# Patient Record
Sex: Female | Born: 1990 | Race: White | Hispanic: No | Marital: Single | Smoking: Former smoker
Health system: Southern US, Community
[De-identification: ages and names within clinical notes are randomized; demographics above are authoritative.]

## PROBLEM LIST (undated history)

## (undated) ENCOUNTER — Emergency Department: Disposition: A | Discharge status: Still In Hospital | Payer: Self-pay

## (undated) DIAGNOSIS — F32A Depression, unspecified: Secondary | ICD-10-CM

## (undated) DIAGNOSIS — Z21 Asymptomatic human immunodeficiency virus [HIV] infection status: Secondary | ICD-10-CM

## (undated) DIAGNOSIS — B20 Human immunodeficiency virus [HIV] disease: Secondary | ICD-10-CM

## (undated) DIAGNOSIS — C801 Malignant (primary) neoplasm, unspecified: Secondary | ICD-10-CM

## (undated) HISTORY — PX: SKIN SURGERY: SHX2413

---

## 2020-11-07 ENCOUNTER — Emergency Department
Admission: EM | Admit: 2020-11-07 | Discharge: 2020-11-07 | Disposition: A | Discharge status: Home / Self Care | Payer: Medicaid Other | Attending: Student in an Organized Health Care Education/Training Program | Admitting: Student in an Organized Health Care Education/Training Program

## 2020-11-07 ENCOUNTER — Other Ambulatory Visit: Payer: Self-pay

## 2020-11-07 DIAGNOSIS — F70 Mild intellectual disabilities: Secondary | ICD-10-CM | POA: Diagnosis not present

## 2020-11-07 DIAGNOSIS — Z21 Asymptomatic human immunodeficiency virus [HIV] infection status: Secondary | ICD-10-CM | POA: Diagnosis not present

## 2020-11-07 DIAGNOSIS — F17210 Nicotine dependence, cigarettes, uncomplicated: Secondary | ICD-10-CM | POA: Insufficient documentation

## 2020-11-07 DIAGNOSIS — Z20822 Contact with and (suspected) exposure to covid-19: Secondary | ICD-10-CM | POA: Insufficient documentation

## 2020-11-07 DIAGNOSIS — F4325 Adjustment disorder with mixed disturbance of emotions and conduct: Secondary | ICD-10-CM

## 2020-11-07 DIAGNOSIS — R45851 Suicidal ideations: Secondary | ICD-10-CM

## 2020-11-07 DIAGNOSIS — F909 Attention-deficit hyperactivity disorder, unspecified type: Secondary | ICD-10-CM | POA: Insufficient documentation

## 2020-11-07 HISTORY — DX: Human immunodeficiency virus (HIV) disease: B20

## 2020-11-07 HISTORY — DX: Asymptomatic human immunodeficiency virus (hiv) infection status: Z21

## 2020-11-07 LAB — COMPREHENSIVE METABOLIC PANEL
ALT: 13 U/L (ref 0–44)
AST: 14 U/L — ABNORMAL LOW (ref 15–41)
Albumin: 4.4 g/dL (ref 3.5–5.0)
Alkaline Phosphatase: 47 U/L (ref 38–126)
Anion gap: 10 (ref 5–15)
BUN: 20 mg/dL (ref 6–20)
CO2: 20 mmol/L — ABNORMAL LOW (ref 22–32)
Calcium: 9.3 mg/dL (ref 8.9–10.3)
Chloride: 107 mmol/L (ref 98–111)
Creatinine, Ser: 1.24 mg/dL — ABNORMAL HIGH (ref 0.44–1.00)
GFR, Estimated: >60 mL/min (ref >60)
Glucose, Bld: 95 mg/dL (ref 70–99)
Potassium: 4.1 mmol/L (ref 3.5–5.1)
Sodium: 137 mmol/L (ref 135–145)
Total Bilirubin: 0.3 mg/dL (ref 0.3–1.2)
Total Protein: 7.8 g/dL (ref 6.5–8.1)

## 2020-11-07 LAB — URINE DRUG SCREEN, QUALITATIVE (ARMC ONLY)
Amphetamines, Ur Screen: NOT DETECTED
Barbiturates, Ur Screen: NOT DETECTED
Benzodiazepine, Ur Scrn: NOT DETECTED
Cannabinoid 50 Ng, Ur ~~LOC~~: NOT DETECTED
Cocaine Metabolite,Ur ~~LOC~~: NOT DETECTED
MDMA (Ecstasy)Ur Screen: NOT DETECTED
Methadone Scn, Ur: NOT DETECTED
Opiate, Ur Screen: NOT DETECTED
Phencyclidine (PCP) Ur S: NOT DETECTED
Tricyclic, Ur Screen: NOT DETECTED

## 2020-11-07 LAB — CBC
HCT: 37.3 % (ref 36.0–46.0)
Hemoglobin: 12.3 g/dL (ref 12.0–15.0)
MCH: 30.6 pg (ref 26.0–34.0)
MCHC: 33 g/dL (ref 30.0–36.0)
MCV: 92.8 fL (ref 80.0–100.0)
Platelets: 222 10*3/uL (ref 150–400)
RBC: 4.02 MIL/uL (ref 3.87–5.11)
RDW: 12.2 % (ref 11.5–15.5)
WBC: 8.9 10*3/uL (ref 4.0–10.5)
nRBC: 0 % (ref 0.0–0.2)

## 2020-11-07 LAB — RESP PANEL BY RT-PCR (FLU A&B, COVID) ARPGX2
Influenza A by PCR: NEGATIVE
Influenza B by PCR: NEGATIVE
SARS Coronavirus 2 by RT PCR: NEGATIVE

## 2020-11-07 LAB — SALICYLATE LEVEL: Salicylate Lvl: <7 mg/dL — ABNORMAL LOW (ref 7.0–30.0)

## 2020-11-07 LAB — ACETAMINOPHEN LEVEL: Acetaminophen (Tylenol), Serum: <10 ug/mL — ABNORMAL LOW (ref 10–30)

## 2020-11-07 LAB — ETHANOL: Alcohol, Ethyl (B): <10 mg/dL (ref <10)

## 2020-11-07 LAB — POC URINE PREG, ED: Preg Test, Ur: NEGATIVE

## 2020-11-07 NOTE — ED Notes (Signed)
TTS at bedside to consult patient, plan of care pending.

## 2020-11-07 NOTE — ED Notes (Signed)
VOLUNTARY as IVC was rescinded by Dr Weber Cooks to d/c

## 2020-11-07 NOTE — ED Notes (Addendum)
Guardian called made aware that patient has been discharged back to her group home. As per guardian instructed to call [facility] [phone]) where she currently lives and ask Adonis Huguenin simmons to come pick her up  As per Ms. Rosita Fire will pick patient up after her 3pm appointment. Will call ed prior to arrival. .

## 2020-11-07 NOTE — ED Notes (Signed)
Pt group home caretaker is Benjamine Mola [phone]  Pt legal guardian Elroy Channel with [facility] [phone]

## 2020-11-07 NOTE — Consult Note (Signed)
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 female patient admitted with "I think I need inpatient or else I am having thoughts of harming myself".  HPI: Patient seen chart reviewed.  We do not have any past written information about her but got some information from her guardian.  30 year old woman with unclear past mental health history but who appears to suffer some degree of intellectual disability.  She came to the emergency room today voluntarily stating that she wanted to "harm myself".  Patient states that she thinks that she might break something and use a sharp thing to cut herself on the throat.  She says this in a rather rehearsed manner.  Tells me that she has harmed herself in the past and shows me wrists although she admits there are no scars on them.  Patient denies being sad.  She says she does feel like she has nerve problems.  She cannot articulate exactly what is on her mind that is bothering her.  She has been at her current group home for about 1 week.  She admits that the staff there have been good to her and that the other residents have not been any problem.  She denies any hallucinations.  In conversation she makes it clear that her true goal is to be placed in a different kind of living facility where there will be more care for her.  She talks about wanting to be in a "living facility" with elderly people.  Also mentions several times that she wants  to "do inpatient" because she feels like it makes her feel better.  She is on medication currently but does not know what it is and we have no list available.  Patient also talks about wanting to harm her guardian in a vague way.  Throughout all of this her affect appears blunted and somewhat tired.  She does not appear to be greatly distressed.  When we talk about her fantasies of inpatient or "a facility" she is clear that she does not actually want to die but wants some kind of change in her environment.  Guardian reports that this is a pattern she has been in several group homes in the last few months and that she will make these kind of statements in order to try and get a change in her living situation  Past Psychiatric History: Patient says she has had several inpatient hospitalizations before.  [place] and [place] she remembers.  She says that she has cut herself on the wrist before but has no scars from it.  Does not know what medication she is on.  She does not appear psychotic and does not endorse any current psychotic symptoms.  Everything about her presentation seems very typical of intellectual disability with severe emotional and institutional impoverishment  Risk to Self:   Risk to Others:   Prior Inpatient Therapy:   Prior Outpatient Therapy:    Past Medical History:  Past Medical History:  Diagnosis Date  . HIV (human immunodeficiency virus infection) (  [place])    History reviewed. No pertinent surgical history. Family History: No family history on file. Family Psychiatric  History: Does not know Social History:  Social History   Substance and Sexual Activity  Alcohol Use None     Social History   Substance and Sexual Activity  Drug Use Not on file    Social History   Socioeconomic History  . Marital status: Single    Spouse name: Not on file  . Number of children: Not on file  . Years of education: Not on file  . Highest education level: Not on file   Occupational History  . Not on file  Tobacco Use  . Smoking status: Current Every Day Smoker    Packs/day: 0.50  . Smokeless tobacco: Not on file  Substance and Sexual Activity  . Alcohol use: Not on file  . Drug use: Not on file  . Sexual activity: Not on file  Other Topics Concern  . Not on file  Social History Narrative  . Not on file   Social Determinants of Health   Financial Resource Strain: Not on file  Food Insecurity: Not on file  Transportation Needs: Not on file  Physical Activity: Not on file  Stress: Not on file  Social Connections: Not on file   Additional Social History:    Allergies:  No Known Allergies  Labs:  Results for orders placed or performed during the hospital encounter of 11/07/20 (from the past 48 hour(s))  Comprehensive metabolic panel     Status: Abnormal   Collection Time: 11/07/20 11:24 AM  Result Value Ref Range   Sodium 137 135 - 145 mmol/L   Potassium 4.1 3.5 - 5.1 mmol/L   Chloride 107 98 - 111 mmol/L   CO2 20 (L) 22 - 32 mmol/L   Glucose, Bld 95 70 - 99 mg/dL    Comment: Glucose reference range applies only to samples taken after fasting for at least 8 hours.   BUN 20 6 - 20 mg/dL   Creatinine, Ser 1.24 (H) 0.44 - 1.00 mg/dL   Calcium 9.3 8.9 - 10.3 mg/dL   Total Protein 7.8 6.5 - 8.1 g/dL   Albumin 4.4 3.5 - 5.0 g/dL   AST 14 (L) 15 - 41 U/L   ALT 13 0 - 44 U/L   Alkaline Phosphatase 47 38 - 126 U/L   Total Bilirubin 0.3 0.3 - 1.2 mg/dL   GFR, Estimated >60 >60 mL/min    Comment: (NOTE) Calculated using the CKD-EPI Creatinine Equation (2021)    Anion gap 10 5 - 15    Comment: Performed at [facility], [address] [place], [place] [address]  Ethanol     Status: None   Collection Time: 11/07/20 11:24 AM  Result Value Ref Range   Alcohol, Ethyl (B) <10 <10 mg/dL    Comment: (NOTE) Lowest detectable limit for serum alcohol is 10 mg/dL.  For medical purposes only. Performed at [facility], [address] [place],  [address]   Salicylate level     Status: Abnormal   Collection Time: 11/07/20 11:24 AM  Result Value Ref Range   Salicylate Lvl <0.9 (L) 7.0 - 30.0 mg/dL    Comment: Performed at [facility], [address] [place], [place] [address]  Acetaminophen level     Status: Abnormal   Collection Time: 11/07/20 11:24 AM  Result Value Ref Range   Acetaminophen (Tylenol), Serum <10 (L) 10 - 30 ug/mL  Comment: (NOTE) Therapeutic concentrations vary significantly. A range of 10-30 ug/mL  may be an effective concentration for many patients. However, some  are best treated at concentrations outside of this range. Acetaminophen concentrations >150 ug/mL at 4 hours after ingestion  and >50 ug/mL at 12 hours after ingestion are often associated with  toxic reactions.  Performed at [facility], [address] [place], [place] [address]   cbc     Status: None   Collection Time: 11/07/20 11:24 AM  Result Value Ref Range   WBC 8.9 4.0 - 10.5 K/uL   RBC 4.02 3.87 - 5.11 MIL/uL   Hemoglobin 12.3 12.0 - 15.0 g/dL   HCT 37.3 36.0 - 46.0 %   MCV 92.8 80.0 - 100.0 fL   MCH 30.6 26.0 - 34.0 pg   MCHC 33.0 30.0 - 36.0 g/dL   RDW 12.2 11.5 - 15.5 %   Platelets 222 150 - 400 K/uL   nRBC 0.0 0.0 - 0.2 %    Comment: Performed at [facility], [address] [place], [place] [address]  POC urine preg, ED     Status: None   Collection Time: 11/07/20 12:44 PM  Result Value Ref Range   Preg Test, Ur NEGATIVE NEGATIVE    Comment:        THE SENSITIVITY OF THIS METHODOLOGY IS >24 mIU/mL   Urine Drug Screen, Qualitative     Status: None   Collection Time: 11/07/20 12:52 PM  Result Value Ref Range   Tricyclic, Ur Screen NONE DETECTED NONE DETECTED   Amphetamines, Ur Screen NONE DETECTED NONE DETECTED   MDMA (Ecstasy)Ur Screen NONE DETECTED NONE DETECTED   Cocaine Metabolite,Ur [place] NONE DETECTED NONE DETECTED   Opiate, Ur Screen NONE DETECTED  NONE DETECTED   Phencyclidine (PCP) Ur S NONE DETECTED NONE DETECTED   Cannabinoid 50 Ng, Ur [place] NONE DETECTED NONE DETECTED   Barbiturates, Ur Screen NONE DETECTED NONE DETECTED   Benzodiazepine, Ur Scrn NONE DETECTED NONE DETECTED   Methadone Scn, Ur NONE DETECTED NONE DETECTED    Comment: (NOTE) Tricyclics + metabolites, urine    Cutoff 1000 ng/mL Amphetamines + metabolites, urine  Cutoff 1000 ng/mL MDMA (Ecstasy), urine              Cutoff 500 ng/mL Cocaine Metabolite, urine          Cutoff 300 ng/mL Opiate + metabolites, urine        Cutoff 300 ng/mL Phencyclidine (PCP), urine         Cutoff 25 ng/mL Cannabinoid, urine                 Cutoff 50 ng/mL Barbiturates + metabolites, urine  Cutoff 200 ng/mL Benzodiazepine, urine              Cutoff 200 ng/mL Methadone, urine                   Cutoff 300 ng/mL  The urine drug screen provides only a preliminary, unconfirmed analytical test result and should not be used for non-medical purposes. Clinical consideration and professional judgment should be applied to any positive drug screen result due to possible interfering substances. A more specific alternate chemical method must be used in order to obtain a confirmed analytical result. Gas chromatography / mass spectrometry (GC/MS) is the preferred confirm atory method. Performed at [facility], [address] [place], [place] [address]   Resp Panel by RT-PCR (Flu A&B, Covid) Nasopharyngeal  Swab     Status: None   Collection Time: 11/07/20 12:52 PM   Specimen: Nasopharyngeal Swab; Nasopharyngeal(NP) swabs in vial transport medium  Result Value Ref Range   SARS Coronavirus 2 by RT PCR NEGATIVE NEGATIVE    Comment: (NOTE) SARS-CoV-2 target nucleic acids are NOT DETECTED.  The SARS-CoV-2 RNA is generally detectable in upper respiratory specimens during the acute phase of infection. The lowest concentration of SARS-CoV-2 viral copies this assay can detect is 138  copies/mL. A negative result does not preclude SARS-Cov-2 infection and should not be used as the sole basis for treatment or other patient management decisions. A negative result may occur with  improper specimen collection/handling, submission of specimen other than nasopharyngeal swab, presence of viral mutation(s) within the areas targeted by this assay, and inadequate number of viral copies(<138 copies/mL). A negative result must be combined with clinical observations, patient history, and epidemiological information. The expected result is Negative.  Fact Sheet for Patients:  EntrepreneurPulse.com.au  Fact Sheet for Healthcare Providers:  IncredibleEmployment.be  This test is no t yet approved or cleared by the [place] FDA and  has been authorized for detection and/or diagnosis of SARS-CoV-2 by FDA under an Emergency Use Authorization (EUA). This EUA will remain  in effect (meaning this test can be used) for the duration of the COVID-19 declaration under Section 564(b)(1) of the Act, 21 U.S.C.section 360bbb-3(b)(1), unless the authorization is terminated  or revoked sooner.       Influenza A by PCR NEGATIVE NEGATIVE   Influenza B by PCR NEGATIVE NEGATIVE    Comment: (NOTE) The Xpert Xpress SARS-CoV-2/FLU/RSV plus assay is intended as an aid in the diagnosis of influenza from Nasopharyngeal swab specimens and should not be used as a sole basis for treatment. Nasal washings and aspirates are unacceptable for Xpert Xpress SARS-CoV-2/FLU/RSV testing.  Fact Sheet for Patients: EntrepreneurPulse.com.au  Fact Sheet for Healthcare Providers: IncredibleEmployment.be  This test is not yet approved or cleared by the [place] FDA and has been authorized for detection and/or diagnosis of SARS-CoV-2 by FDA under an Emergency Use Authorization (EUA). This EUA will remain in effect (meaning this test  can be used) for the duration of the COVID-19 declaration under Section 564(b)(1) of the Act, 21 U.S.C. section 360bbb-3(b)(1), unless the authorization is terminated or revoked.  Performed at [facility], [address] [place], [place] [address]     No current facility-administered medications for this encounter.   No current outpatient medications on file.    Musculoskeletal: Strength & Muscle Tone: within normal limits Gait & Station: normal Patient leans: N/A  Psychiatric Specialty Exam: Physical Exam Vitals and nursing note reviewed.  Constitutional:      Appearance: She is well-developed and well-nourished.  HENT:     Head: Normocephalic and atraumatic.  Eyes:     Conjunctiva/sclera: Conjunctivae normal.     Pupils: Pupils are equal, round, and reactive to light.  Cardiovascular:     Heart sounds: Normal heart sounds.  Pulmonary:     Effort: Pulmonary effort is normal.  Abdominal:     Palpations: Abdomen is soft.  Musculoskeletal:        General: Normal range of motion.     Cervical back: Normal range of motion.  Skin:    General: Skin is warm and dry.  Neurological:     General: No focal deficit present.     Mental Status: She is alert.  Psychiatric:  Attention and Perception: Attention normal.        Mood and Affect: Affect is blunt.        Speech: Speech is delayed.        Behavior: Behavior is slowed.        Thought Content: Thought content is not paranoid. Thought content includes suicidal ideation. Thought content does not include suicidal plan.        Cognition and Memory: Cognition is impaired.        Judgment: Judgment is impulsive.     Review of Systems  Constitutional: Negative.   HENT: Negative.   Eyes: Negative.   Respiratory: Negative.   Cardiovascular: Negative.   Gastrointestinal: Negative.   Musculoskeletal: Negative.   Skin: Negative.   Neurological: Negative.   Psychiatric/Behavioral: Positive for dysphoric  mood and suicidal ideas.    Blood pressure 119/78, pulse 93, temperature 98 F (36.7 C), temperature source Oral, resp. rate 18, height 5\' 1"  (1.549 m), weight 81.6 kg, SpO2 100 %.Body mass index is 34.01 kg/m.  General Appearance: Casual  Eye Contact:  Fair  Speech:  Clear and Coherent  Volume:  Normal  Mood:  Dysphoric  Affect:  Congruent  Thought Process:  Goal Directed  Orientation:  Full (Time, Place, and Person)  Thought Content:  Concrete and simplistic  Suicidal Thoughts:  Yes.  without intent/plan  Homicidal Thoughts:  No  Memory:  Immediate;   Fair Recent;   Poor Remote;   Poor  Judgement:  Impaired  Insight:  Shallow  Psychomotor Activity:  Decreased  Concentration:  Concentration: Poor  Recall:  Poor  Fund of Knowledge:  Poor  Language:  Poor  Akathisia:  No  Handed:  Right  AIMS (if indicated):     Assets:  Desire for Improvement  ADL's:  Impaired  Cognition:  Impaired,  Mild  Sleep:        Treatment Plan Summary: Plan This is a 30 year old woman who is presenting in a very familiar manner of people usually with intellectual disability who have been placed into new group homes.  Often they will make threats about harming herself in a manipulative manner and have few skills to try and deal with her anxiety.  Very little benefit from inpatient hospitalization except to reinforce this sort of mid behavior.  It is possible that the patient might act out in some way but if she does it is not because of depression or psychosis but purely out of intentional manipulation as she makes clear.  No benefit to hospitalization.  Recommend she be discharged back to her group home where I understand they are working on getting her into a day program which would be most beneficial.  Spent some time encouraging her to think positively and find other ways to deal with stress.  No change to medicine.  Case reviewed with TTS and emergency room physician.  I have been told by TTS that the  guardian is contacted and does not feel that there are any safety concerns.  Disposition: Supportive therapy provided about ongoing stressors.  Alethia Berthold, MD 11/07/2020 2:43 PM

## 2020-11-07 NOTE — ED Triage Notes (Signed)
Pt states that she has been having suicidal thoughts- pt states she has had multiple attempts in the past- pt states she does not want any visitors while she is here especially the group home director- pt states she has a legal guardian and does not want to see them either- pt states every time she is in a group home she feels like she wants to harm herself

## 2020-11-07 NOTE — ED Notes (Signed)
Pt dressed into hospital scubs- one black sports bra, one black long sleeve shirt, one jacket, one pair of black tennis shoes, one pair of black socks, one pair purple pants, and one pair pink underwear placed in pt belongings bag

## 2020-11-07 NOTE — ED Provider Notes (Signed)
[facility] Emergency Department Provider Note    Event Date/Time   First MD Initiated Contact with Patient 11/07/20 1131     (approximate)  I have reviewed the triage vital signs and the nursing notes.   HISTORY  Chief Complaint Suicidal    HPI Crystal [name] is a 30 y.o. female below listed past medical history presents to ER from group home with suicidal ideation and stating that she "no longer wants to live.  "States that she feels like from time to time the devil is with inside her and she cannot get rid of him.  Has desire to cut herself and states that that would be her plan to end it.  States she would go home break glass slit her wrists.  Does have a history of previous self-harm attempts.  Has not made any recent medication changes.    Past Medical History:  Diagnosis Date  . HIV (human immunodeficiency virus infection) ([place])    No family history on file. History reviewed. No pertinent surgical history. There are no problems to display for this patient.     Prior to Admission medications   Not on File    Allergies Patient has no known allergies.    Social History Social History   Tobacco Use  . Smoking status: Current Every Day Smoker    Packs/day: 0.50    Review of Systems Patient denies headaches, rhinorrhea, blurry vision, numbness, shortness of breath, chest pain, edema, cough, abdominal pain, nausea, vomiting, diarrhea, dysuria, fevers, rashes or hallucinations unless otherwise stated above in HPI. ____________________________________________   PHYSICAL EXAM:  VITAL SIGNS: Vitals:   11/07/20 1120  BP: 119/78  Pulse: 93  Resp: 18  Temp: 98 F (36.7 C)  SpO2: 100%    Constitutional: Alert and oriented.  Eyes: Conjunctivae are normal.  Head: Atraumatic. Nose: No congestion/rhinnorhea. Mouth/Throat: Mucous membranes are moist.   Neck: No stridor. Painless ROM.  Cardiovascular: Normal rate, regular rhythm.  Grossly normal heart sounds.  Good peripheral circulation. Respiratory: Normal respiratory effort.  No retractions. Lungs CTAB. Gastrointestinal: Soft and nontender. No distention. No abdominal bruits. No CVA tenderness. Genitourinary:  Musculoskeletal: No lower extremity tenderness nor edema.  No joint effusions. Neurologic:  Normal speech and language. No gross focal neurologic deficits are appreciated. No facial droop Skin:  Skin is warm, dry and intact. No rash noted. Psychiatric: Mood and affect are depressed. Speech and behavior are normal.  ____________________________________________   LABS (all labs ordered are listed, but only abnormal results are displayed)  No results found for this or any previous visit (from the past 24 hour(s)). ____________________________________________ ____________________________________________  [id number]   ____________________________________________   PROCEDURES  Procedure(s) performed:  Procedures    Critical Care performed: no ____________________________________________   INITIAL IMPRESSION / ASSESSMENT AND PLAN / ED COURSE  Pertinent labs & imaging results that were available during my care of the patient were reviewed by me and considered in my medical decision making (see chart for details).   DDX: Psychosis, delirium, medication effect, noncompliance, polysubstance abuse, Si, Hi, depression   Crystal [name] is a 30 y.o. who presents to the ED with for evaluation of SI.  Patient has psych history of SI.  Laboratory testing was ordered to evaluation for underlying electrolyte derangement or signs of underlying organic pathology to explain today's presentation.  Based on history and physical and laboratory evaluation, it appears that the patient's presentation is 2/2 underlying psychiatric disorder and will require further evaluation and  management by inpatient psychiatry.  Patient was  made an IVC due to SI with a plan.   Disposition pending psychiatric evaluation.     The patient has been placed in psychiatric observation due to the need to provide a safe environment for the patient while obtaining psychiatric consultation and evaluation, as well as ongoing medical and medication management to treat the patient's condition.  The patient has been placed under full IVC at this time.   The patient was evaluated in Emergency Department today for the symptoms described in the history of present illness. He/she was evaluated in the context of the global COVID-19 pandemic, which necessitated consideration that the patient might be at risk for infection with the SARS-CoV-2 virus that causes COVID-19. Institutional protocols and algorithms that pertain to the evaluation of patients at risk for COVID-19 are in a state of rapid change based on information released by regulatory bodies including the CDC and federal and state organizations. These policies and algorithms were followed during the patient's care in the ED.  As part of my medical decision making, I reviewed the following data within the [facility] notes reviewed and incorporated, Labs reviewed, notes from prior ED visits and [place] Controlled Substance Database   ____________________________________________   FINAL CLINICAL IMPRESSION(S) / ED DIAGNOSES  Final diagnoses:  Suicidal ideation      NEW MEDICATIONS STARTED DURING THIS VISIT:  New Prescriptions   No medications on file     Note:  This document was prepared using Dragon voice recognition software and may include unintentional dictation errors.    Merlyn Lot, MD 11/07/20 1140

## 2020-11-07 NOTE — ED Notes (Signed)
Assumed care of patient, patient from group home, reports she is a ward of the state. Her guardian is a Event organiser and she feels like her dss guardian has an evil spirit, reports that the guardian takes things aware from her on a regular basis and this is what has her feeling sad with thoughts of harming herself. Patient denied a plan on how she will harm herself, reports only has thoughts of it right now. Patient calm and cooperative, does not want a DSS guardian prefers to be at [facility] where she feels she will be more independent.

## 2020-11-07 NOTE — ED Notes (Signed)
Ms. Crystal [name] here to pick patient up.

## 2020-11-07 NOTE — ED Notes (Signed)
INVOLUNTARY with all papers on chart/awaiting TTS/PSYCH consult ?

## 2020-11-07 NOTE — ED Notes (Signed)
Gave patient food tray with juice.

## 2020-11-07 NOTE — ED Notes (Signed)
Dinner tray given. No other needs found at this moment.  ?

## 2020-11-07 NOTE — BH Assessment (Signed)
Comprehensive Clinical Assessment (CCA) Screening, Triage and Referral Note  11/07/2020 Crystal [name] [id number]   Chief Complaint:  Chief Complaint  Patient presents with  . Suicidal   Visit Diagnosis: Adjustment D/O  Patient Reported Information How did you hear about [place]? Self   Referral name: Self   Referral phone number: No data recorded Whom do you see for routine medical problems? Primary Care   Practice/Facility Name: No data recorded  Practice/Facility Phone Number: No data recorded  Name of Contact: No data recorded  Contact Number: No data recorded  Contact Fax Number: No data recorded  Prescriber Name: No data recorded  Prescriber Address (if known): No data recorded What Is the Reason for Your Visit/Call Today? No data recorded How Long Has This Been Causing You Problems? > than 6 months  Have You Recently Been in Any Inpatient Treatment (Hospital/Detox/Crisis Center/28-Day Program)? No   Name/Location of Program/Hospital:No data recorded  How Long Were You There? No data recorded  When Were You Discharged? No data recorded Have You Ever Received Services From [facility] Before? No   Who Do You See at [facility]? No data recorded Have You Recently Had Any Thoughts About Hurting Yourself? Yes   Are You Planning to Commit Suicide/Harm Yourself At This time?  No  Have you Recently Had Thoughts About [name]? No   Explanation: No data recorded Have You Used Any Alcohol or Drugs in the Past 24 Hours? No   How Long Ago Did You Use Drugs or Alcohol?  No data recorded  What Did You Use and How Much? No data recorded What Do You Feel Would Help You the Most Today? Other (Comment)  Do You Currently Have a Therapist/Psychiatrist? No   Name of Therapist/Psychiatrist: No data recorded  Have You Been Recently Discharged From Any Office Practice or Programs? No   Explanation of Discharge From Practice/Program:  No data recorded    CCA  Screening Triage Referral Assessment Type of Contact: Face-to-Face   Is this Initial or Reassessment? No data recorded  Date Telepsych consult ordered in [facility]:  No data recorded  Time Telepsych consult ordered in [facility]:  No data recorded Patient Reported Information Reviewed? Yes   Patient Left Without Being Seen? No data recorded  Reason for Not Completing Assessment: No data recorded Collateral Involvement: Spoke with DSS Guardian  Does Patient Have a Center? No data recorded  Name and Contact of Legal Guardian:  No data recorded If Minor and Not Living with Parent(s), Who has Custody? [place] [phone]  Is CPS involved or ever been involved? Never  Is APS involved or ever been involved? Currently  Patient Determined To Be At Risk for Harm To Self or Others Based on Review of Patient Reported Information or Presenting Complaint? No   Method: No data recorded  Availability of Means: No data recorded  Intent: No data recorded  Notification Required: No data recorded  Additional Information for Danger to Others Potential:  No data recorded  Additional Comments for Danger to Others Potential:  No data recorded  Are There Guns or Other Weapons in Your Home?  No data recorded   Types of Guns/Weapons: No data recorded   Are These Weapons Safely Secured?                              No data recorded   Who Could Verify You  Are Able To Have These Secured:    No data recorded Do You Have any Outstanding Charges, Pending Court Dates, Parole/Probation? No data recorded Contacted To Inform of Risk of Harm To Self or Others: No data recorded Location of Assessment: [facility] ED  Does Patient Present under Involuntary Commitment? No   IVC Papers Initial File Date: No data recorded  [place] of Residence: [place]  Patient Currently Receiving the Following Services: PSR (Psychosocial Rehabilitation/[facility])   Determination of Need: Emergent (2  hours)   Options For Referral: ED Referral   Crystal Fusi MS, LCAS, [facility] [place] Therapeutic Triage Specialist 11/07/2020 2:06 PM

## 2020-11-07 NOTE — ED Provider Notes (Signed)
The patient has been evaluated at bedside by Dr. Weber Cooks, psychiatry.  Patient is clinically stable.  Not felt to be a danger to self or others.  No SI or Hi.  No indication for inpatient psychiatric admission at this time.  Appropriate for continued outpatient therapy.    Merlyn Lot, MD 11/07/20 [phone]

## 2020-11-08 ENCOUNTER — Encounter: Payer: Self-pay | Admitting: Emergency Medicine

## 2020-11-08 ENCOUNTER — Emergency Department
Admission: EM | Admit: 2020-11-08 | Discharge: 2020-11-11 | Disposition: A | Discharge status: Other Acute Inpatient Hospital | Payer: Medicaid Other | Attending: Emergency Medicine | Admitting: Emergency Medicine

## 2020-11-08 ENCOUNTER — Other Ambulatory Visit: Payer: Self-pay

## 2020-11-08 DIAGNOSIS — R259 Unspecified abnormal involuntary movements: Secondary | ICD-10-CM | POA: Insufficient documentation

## 2020-11-08 DIAGNOSIS — F70 Mild intellectual disabilities: Secondary | ICD-10-CM | POA: Diagnosis not present

## 2020-11-08 DIAGNOSIS — X789XXA Intentional self-harm by unspecified sharp object, initial encounter: Secondary | ICD-10-CM

## 2020-11-08 DIAGNOSIS — Z20822 Contact with and (suspected) exposure to covid-19: Secondary | ICD-10-CM | POA: Diagnosis not present

## 2020-11-08 DIAGNOSIS — Z85828 Personal history of other malignant neoplasm of skin: Secondary | ICD-10-CM | POA: Diagnosis not present

## 2020-11-08 DIAGNOSIS — R45851 Suicidal ideations: Secondary | ICD-10-CM | POA: Diagnosis not present

## 2020-11-08 DIAGNOSIS — R109 Unspecified abdominal pain: Secondary | ICD-10-CM | POA: Insufficient documentation

## 2020-11-08 DIAGNOSIS — Z21 Asymptomatic human immunodeficiency virus [HIV] infection status: Secondary | ICD-10-CM | POA: Diagnosis not present

## 2020-11-08 DIAGNOSIS — F17210 Nicotine dependence, cigarettes, uncomplicated: Secondary | ICD-10-CM | POA: Diagnosis not present

## 2020-11-08 DIAGNOSIS — B20 Human immunodeficiency virus [HIV] disease: Secondary | ICD-10-CM

## 2020-11-08 DIAGNOSIS — Z046 Encounter for general psychiatric examination, requested by authority: Secondary | ICD-10-CM | POA: Insufficient documentation

## 2020-11-08 DIAGNOSIS — F4325 Adjustment disorder with mixed disturbance of emotions and conduct: Secondary | ICD-10-CM | POA: Diagnosis present

## 2020-11-08 DIAGNOSIS — F329 Major depressive disorder, single episode, unspecified: Secondary | ICD-10-CM | POA: Insufficient documentation

## 2020-11-08 HISTORY — DX: Depression, unspecified: F32.A

## 2020-11-08 HISTORY — DX: Malignant (primary) neoplasm, unspecified: C80.1

## 2020-11-08 LAB — COMPREHENSIVE METABOLIC PANEL
ALT: 13 U/L (ref 0–44)
AST: 14 U/L — ABNORMAL LOW (ref 15–41)
Albumin: 4.6 g/dL (ref 3.5–5.0)
Alkaline Phosphatase: 48 U/L (ref 38–126)
Anion gap: 6 (ref 5–15)
BUN: 16 mg/dL (ref 6–20)
CO2: 23 mmol/L (ref 22–32)
Calcium: 9.2 mg/dL (ref 8.9–10.3)
Chloride: 108 mmol/L (ref 98–111)
Creatinine, Ser: 1.12 mg/dL — ABNORMAL HIGH (ref 0.44–1.00)
GFR, Estimated: >60 mL/min (ref >60)
Glucose, Bld: 94 mg/dL (ref 70–99)
Potassium: 4 mmol/L (ref 3.5–5.1)
Sodium: 137 mmol/L (ref 135–145)
Total Bilirubin: 0.5 mg/dL (ref 0.3–1.2)
Total Protein: 8.2 g/dL — ABNORMAL HIGH (ref 6.5–8.1)

## 2020-11-08 LAB — URINE DRUG SCREEN, QUALITATIVE (ARMC ONLY)
Amphetamines, Ur Screen: NOT DETECTED
Barbiturates, Ur Screen: NOT DETECTED
Benzodiazepine, Ur Scrn: NOT DETECTED
Cannabinoid 50 Ng, Ur ~~LOC~~: NOT DETECTED
Cocaine Metabolite,Ur ~~LOC~~: NOT DETECTED
MDMA (Ecstasy)Ur Screen: NOT DETECTED
Methadone Scn, Ur: NOT DETECTED
Opiate, Ur Screen: NOT DETECTED
Phencyclidine (PCP) Ur S: NOT DETECTED
Tricyclic, Ur Screen: NOT DETECTED

## 2020-11-08 LAB — CBC
HCT: 38.1 % (ref 36.0–46.0)
Hemoglobin: 12.5 g/dL (ref 12.0–15.0)
MCH: 30.6 pg (ref 26.0–34.0)
MCHC: 32.8 g/dL (ref 30.0–36.0)
MCV: 93.2 fL (ref 80.0–100.0)
Platelets: 235 10*3/uL (ref 150–400)
RBC: 4.09 MIL/uL (ref 3.87–5.11)
RDW: 12.4 % (ref 11.5–15.5)
WBC: 7.8 10*3/uL (ref 4.0–10.5)
nRBC: 0 % (ref 0.0–0.2)

## 2020-11-08 LAB — RESP PANEL BY RT-PCR (FLU A&B, COVID) ARPGX2
Influenza A by PCR: NEGATIVE
Influenza B by PCR: NEGATIVE
SARS Coronavirus 2 by RT PCR: NEGATIVE

## 2020-11-08 LAB — ETHANOL: Alcohol, Ethyl (B): <10 mg/dL (ref <10)

## 2020-11-08 LAB — POC URINE PREG, ED: Preg Test, Ur: NEGATIVE

## 2020-11-08 LAB — SALICYLATE LEVEL: Salicylate Lvl: <7 mg/dL — ABNORMAL LOW (ref 7.0–30.0)

## 2020-11-08 LAB — ACETAMINOPHEN LEVEL: Acetaminophen (Tylenol), Serum: <10 ug/mL — ABNORMAL LOW (ref 10–30)

## 2020-11-08 NOTE — Consult Note (Signed)
[place] Psychiatry Consult   Reason for Consult: 30 year old with developmental disability comes back to the emergency room with cuts on her wrist Referring Physician: Joni Fears Patient Identification: Crystal [name] MRN:  [id number] Principal Diagnosis: Mild intellectual disability Diagnosis:  Principal Problem:   Mild intellectual disability Active Problems:   Adjustment disorder with mixed disturbance of emotions and conduct   Self-inflicted laceration of wrist, initial encounter ([place])   Total Time spent with patient: 1 hour  Subjective:   Crystal [name] is a 30 y.o. female patient admitted with "I need to be admitted".  HPI: Patient seen chart reviewed.  She was also here yesterday.  At that time she was saying that she was going to cut herself with something that she broke in the bathroom if we sent her home.  True to her words she broke a mirror at home and has superficially scratched herself on both wrists.  Did not bleed.  None of the cuts need any kind of suturing.  Patient is continuing to state that her goal is to be admitted to the hospital because she does not like her group home.  Says that if she goes home she will continue to harm herself.  Very concrete simplistic thinking.  No new complaints.  Nothing specific that got her upset or bothered  Past Psychiatric History: Intellectual disability and behavior problems has been through multiple group homes because of this sort of thing  Risk to Self:   Risk to Others:   Prior Inpatient Therapy:   Prior Outpatient Therapy:    Past Medical History:  Past Medical History:  Diagnosis Date  . Cancer ([place])    skin  . Depression   . HIV (human immunodeficiency virus infection) ([place])     Past Surgical History:  Procedure Laterality Date  . SKIN SURGERY     Family History: History reviewed. No pertinent family history. Family Psychiatric  History: See previous Social History:  Social History   Substance and  Sexual Activity  Alcohol Use Not Currently     Social History   Substance and Sexual Activity  Drug Use Not Currently    Social History   Socioeconomic History  . Marital status: Single    Spouse name: Not on file  . Number of children: Not on file  . Years of education: Not on file  . Highest education level: Not on file  Occupational History  . Not on file  Tobacco Use  . Smoking status: Current Every Day Smoker    Packs/day: 0.50  . Smokeless tobacco: Never Used  Substance and Sexual Activity  . Alcohol use: Not Currently  . Drug use: Not Currently  . Sexual activity: Not on file  Other Topics Concern  . Not on file  Social History Narrative  . Not on file   Social Determinants of Health   Financial Resource Strain: Not on file  Food Insecurity: Not on file  Transportation Needs: Not on file  Physical Activity: Not on file  Stress: Not on file  Social Connections: Not on file   Additional Social History:    Allergies:  No Known Allergies  Labs:  Results for orders placed or performed during the hospital encounter of 11/08/20 (from the past 48 hour(s))  Comprehensive metabolic panel     Status: Abnormal   Collection Time: 11/08/20  1:21 PM  Result Value Ref Range   Sodium 137 135 - 145 mmol/L   Potassium 4.0 3.5 - 5.1 mmol/L  Chloride 108 98 - 111 mmol/L   CO2 23 22 - 32 mmol/L   Glucose, Bld 94 70 - 99 mg/dL    Comment: Glucose reference range applies only to samples taken after fasting for at least 8 hours.   BUN 16 6 - 20 mg/dL   Creatinine, Ser 1.12 (H) 0.44 - 1.00 mg/dL   Calcium 9.2 8.9 - 10.3 mg/dL   Total Protein 8.2 (H) 6.5 - 8.1 g/dL   Albumin 4.6 3.5 - 5.0 g/dL   AST 14 (L) 15 - 41 U/L   ALT 13 0 - 44 U/L   Alkaline Phosphatase 48 38 - 126 U/L   Total Bilirubin 0.5 0.3 - 1.2 mg/dL   GFR, Estimated >60 >60 mL/min    Comment: (NOTE) Calculated using the CKD-EPI Creatinine Equation (2021)    Anion gap 6 5 - 15    Comment: Performed at  [facility], [address] [place], [place] [address]  Ethanol     Status: None   Collection Time: 11/08/20  1:21 PM  Result Value Ref Range   Alcohol, Ethyl (B) <10 <10 mg/dL    Comment: (NOTE) Lowest detectable limit for serum alcohol is 10 mg/dL.  For medical purposes only. Performed at [facility], [address] [place], [place] [address]   Salicylate level     Status: Abnormal   Collection Time: 11/08/20  1:21 PM  Result Value Ref Range   Salicylate Lvl <3.1 (L) 7.0 - 30.0 mg/dL    Comment: Performed at [facility], [address] [place], [place] [address]  Acetaminophen level     Status: Abnormal   Collection Time: 11/08/20  1:21 PM  Result Value Ref Range   Acetaminophen (Tylenol), Serum <10 (L) 10 - 30 ug/mL    Comment: (NOTE) Therapeutic concentrations vary significantly. A range of 10-30 ug/mL  may be an effective concentration for many patients. However, some  are best treated at concentrations outside of this range. Acetaminophen concentrations >150 ug/mL at 4 hours after ingestion  and >50 ug/mL at 12 hours after ingestion are often associated with  toxic reactions.  Performed at [facility], [address] [place], [place] [address]   cbc     Status: None   Collection Time: 11/08/20  1:21 PM  Result Value Ref Range   WBC 7.8 4.0 - 10.5 K/uL   RBC 4.09 3.87 - 5.11 MIL/uL   Hemoglobin 12.5 12.0 - 15.0 g/dL   HCT 38.1 36.0 - 46.0 %   MCV 93.2 80.0 - 100.0 fL   MCH 30.6 26.0 - 34.0 pg   MCHC 32.8 30.0 - 36.0 g/dL   RDW 12.4 11.5 - 15.5 %   Platelets 235 150 - 400 K/uL   nRBC 0.0 0.0 - 0.2 %    Comment: Performed at [facility], [address] [place], [place] [address]  Urine Drug Screen, Qualitative     Status: None   Collection Time: 11/08/20  1:21 PM  Result Value Ref Range   Tricyclic, Ur Screen NONE DETECTED NONE DETECTED   Amphetamines, Ur Screen NONE DETECTED NONE DETECTED   MDMA  (Ecstasy)Ur Screen NONE DETECTED NONE DETECTED   Cocaine Metabolite,Ur [place] NONE DETECTED NONE DETECTED   Opiate, Ur Screen NONE DETECTED NONE DETECTED   Phencyclidine (PCP) Ur S NONE DETECTED NONE DETECTED   Cannabinoid 50 Ng, Ur [place] NONE DETECTED NONE DETECTED   Barbiturates, Ur Screen NONE DETECTED NONE DETECTED  Benzodiazepine, Ur Scrn NONE DETECTED NONE DETECTED   Methadone Scn, Ur NONE DETECTED NONE DETECTED    Comment: (NOTE) Tricyclics + metabolites, urine    Cutoff 1000 ng/mL Amphetamines + metabolites, urine  Cutoff 1000 ng/mL MDMA (Ecstasy), urine              Cutoff 500 ng/mL Cocaine Metabolite, urine          Cutoff 300 ng/mL Opiate + metabolites, urine        Cutoff 300 ng/mL Phencyclidine (PCP), urine         Cutoff 25 ng/mL Cannabinoid, urine                 Cutoff 50 ng/mL Barbiturates + metabolites, urine  Cutoff 200 ng/mL Benzodiazepine, urine              Cutoff 200 ng/mL Methadone, urine                   Cutoff 300 ng/mL  The urine drug screen provides only a preliminary, unconfirmed analytical test result and should not be used for non-medical purposes. Clinical consideration and professional judgment should be applied to any positive drug screen result due to possible interfering substances. A more specific alternate chemical method must be used in order to obtain a confirmed analytical result. Gas chromatography / mass spectrometry (GC/MS) is the preferred confirm atory method. Performed at [facility], [address] [place], [place] [address]   POC urine preg, ED     Status: None   Collection Time: 11/08/20  2:25 PM  Result Value Ref Range   Preg Test, Ur NEGATIVE NEGATIVE    Comment:        THE SENSITIVITY OF THIS METHODOLOGY IS >24 mIU/mL   Resp Panel by RT-PCR (Flu A&B, Covid) Nasopharyngeal Swab     Status: None   Collection Time: 11/08/20  4:22 PM   Specimen: Nasopharyngeal Swab; Nasopharyngeal(NP) swabs in vial transport medium   Result Value Ref Range   SARS Coronavirus 2 by RT PCR NEGATIVE NEGATIVE    Comment: (NOTE) SARS-CoV-2 target nucleic acids are NOT DETECTED.  The SARS-CoV-2 RNA is generally detectable in upper respiratory specimens during the acute phase of infection. The lowest concentration of SARS-CoV-2 viral copies this assay can detect is 138 copies/mL. A negative result does not preclude SARS-Cov-2 infection and should not be used as the sole basis for treatment or other patient management decisions. A negative result may occur with  improper specimen collection/handling, submission of specimen other than nasopharyngeal swab, presence of viral mutation(s) within the areas targeted by this assay, and inadequate number of viral copies(<138 copies/mL). A negative result must be combined with clinical observations, patient history, and epidemiological information. The expected result is Negative.  Fact Sheet for Patients:  EntrepreneurPulse.com.au  Fact Sheet for Healthcare Providers:  IncredibleEmployment.be  This test is no t yet approved or cleared by the [place] FDA and  has been authorized for detection and/or diagnosis of SARS-CoV-2 by FDA under an Emergency Use Authorization (EUA). This EUA will remain  in effect (meaning this test can be used) for the duration of the COVID-19 declaration under Section 564(b)(1) of the Act, 21 U.S.C.section 360bbb-3(b)(1), unless the authorization is terminated  or revoked sooner.       Influenza A by PCR NEGATIVE NEGATIVE   Influenza B by PCR NEGATIVE NEGATIVE    Comment: (NOTE) The Xpert Xpress SARS-CoV-2/FLU/RSV plus assay is intended as an aid in  the diagnosis of influenza from Nasopharyngeal swab specimens and should not be used as a sole basis for treatment. Nasal washings and aspirates are unacceptable for Xpert Xpress SARS-CoV-2/FLU/RSV testing.  Fact Sheet for  Patients: EntrepreneurPulse.com.au  Fact Sheet for Healthcare Providers: IncredibleEmployment.be  This test is not yet approved or cleared by the [place] FDA and has been authorized for detection and/or diagnosis of SARS-CoV-2 by FDA under an Emergency Use Authorization (EUA). This EUA will remain in effect (meaning this test can be used) for the duration of the COVID-19 declaration under Section 564(b)(1) of the Act, 21 U.S.C. section 360bbb-3(b)(1), unless the authorization is terminated or revoked.  Performed at [facility], [address] [place],  [address]     No current facility-administered medications for this encounter.   No current outpatient medications on file.    Musculoskeletal: Strength & Muscle Tone: within normal limits Gait & Station: normal Patient leans: N/A  Psychiatric Specialty Exam: Physical Exam Vitals and nursing note reviewed.  Constitutional:      Appearance: She is well-developed and well-nourished.  HENT:     Head: Normocephalic and atraumatic.  Eyes:     Conjunctiva/sclera: Conjunctivae normal.     Pupils: Pupils are equal, round, and reactive to light.  Cardiovascular:     Heart sounds: Normal heart sounds.  Pulmonary:     Effort: Pulmonary effort is normal.  Abdominal:     Palpations: Abdomen is soft.  Musculoskeletal:        General: Normal range of motion.     Cervical back: Normal range of motion.  Skin:    General: Skin is warm and dry.       Neurological:     General: No focal deficit present.     Mental Status: She is alert.  Psychiatric:        Attention and Perception: Attention normal.        Mood and Affect: Affect is blunt.        Speech: Speech is delayed.        Behavior: Behavior is slowed.        Thought Content: Thought content includes suicidal ideation. Thought content includes suicidal plan.        Cognition and Memory: Cognition is impaired.         Judgment: Judgment is inappropriate.     Review of Systems  Constitutional: Negative.   HENT: Negative.   Eyes: Negative.   Respiratory: Negative.   Cardiovascular: Negative.   Gastrointestinal: Negative.   Musculoskeletal: Negative.   Skin: Negative.   Neurological: Negative.   Psychiatric/Behavioral: Positive for dysphoric mood and suicidal ideas.    Blood pressure 107/72, pulse 88, temperature 98.4 F (36.9 C), resp. rate 20, height 5\' 2"  (1.575 m), weight 68 kg, SpO2 99 %.Body mass index is 27.42 kg/m.  General Appearance: Casual  Eye Contact:  Fair  Speech:  Slow  Volume:  Decreased  Mood:  Dysphoric  Affect:  Congruent  Thought Process:  Disorganized  Orientation:  Full (Time, Place, and Person)  Thought Content:  Tangential  Suicidal Thoughts:  Yes.  with intent/plan  Homicidal Thoughts:  No  Memory:  Immediate;   Fair Recent;   Poor Remote;   Poor  Judgement:  Impaired  Insight:  Lacking  Psychomotor Activity:  Decreased  Concentration:  Concentration: Poor  Recall:  Poor  Fund of Knowledge:  Fair  Language:  Fair  Akathisia:  No  Handed:  Right  AIMS (  if indicated):     Assets:  Desire for Improvement Housing  ADL's:  Impaired  Cognition:  Impaired,  Mild  Sleep:        Treatment Plan Summary: Medication management and Plan Patient will be continued on her HIV medicine and normal medications from home.  Continue IVC.  Supportive counseling to the patient.  Patient will be referred out as we have no further beds for admission at this time.  Disposition: Recommend psychiatric Inpatient admission when medically cleared.  Alethia Berthold, MD 11/08/2020 5:31 PM

## 2020-11-08 NOTE — ED Triage Notes (Signed)
Pt comes into the ED via [employer] under IVC for attempted suicide.  Pt completed self harm by taking glass form a bathroom mirror and using it to cut her wrists bilaterally and neck.  No lacerations present on the neck, but small abrasions on the left cheek.  Pt states she has been feeling this way for a while.

## 2020-11-08 NOTE — BH Assessment (Signed)
This Probation officer attempted to contact pt's legal guardian Elroy Channel [phone]) however, there was no answer. This Probation officer left a HIPPA compliant message requesting a return phone call.

## 2020-11-08 NOTE — ED Notes (Signed)
IVC pending placement 

## 2020-11-08 NOTE — ED Provider Notes (Signed)
[facility] Emergency Department Provider Note  ____________________________________________  Time seen: Approximately 3:11 PM  I have reviewed the triage vital signs and the nursing notes.   HISTORY  Chief Complaint Suicidal    Level 5 Caveat: Portions of the History and Physical including HPI and review of systems are unable to be completely obtained due to patient being a poor historian   HPI Crystal [name] is a 30 y.o. female with a history of depression and intellectual disability  who was brought to the ED under involuntary commitment due to self-injurious behavior.  Patient reports that she feels that her life is worthless and does not want to live anymore, feels suicidal, took broken glass from a bathroom mirror and used it to cut her left cheek and her wrists.  Denies any acute illness or other symptoms.  No other injuries.     Past Medical History:  Diagnosis Date  . Cancer ([place])    skin  . Depression   . HIV (human immunodeficiency virus infection) [facility])      Patient Active Problem List   Diagnosis Date Noted  . 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 SKIN SURGERY       Prior to Admission medications   Not on File     Allergies Patient has no known allergies.   History reviewed. No pertinent family history.  Social History Social History   Tobacco Use  . Smoking status: Current Every Day Smoker    Packs/day: 0.50  . Smokeless tobacco: Never Used  Substance Use Topics  . Alcohol use: Not Currently  . Drug use: Not Currently    Review of Systems Level 5 Caveat: Portions of the History and Physical including HPI and review of systems are unable to be completely obtained due to patient being a poor historian   Constitutional:   No known fever.  ENT:   No  rhinorrhea. Cardiovascular:   No chest pain or syncope. Respiratory:   No dyspnea or cough. Gastrointestinal:   Negative for abdominal pain, vomiting and diarrhea.  Musculoskeletal:   Negative for focal pain or swelling ____________________________________________   PHYSICAL EXAM:  VITAL SIGNS: ED Triage Vitals  Enc Vitals Group     BP 11/08/20 1321 107/72     Pulse Rate 11/08/20 1321 88     Resp 11/08/20 1321 20     Temp 11/08/20 1321 98.4 F (36.9 C)     Temp src --      SpO2 11/08/20 1321 99 %     Weight 11/08/20 1321 150 lb (68 kg)     Height 11/08/20 1321 5\' 1"  (1.549 m)     Head Circumference --      Peak Flow --      Pain Score 11/08/20 1327 8     Pain Loc --      Pain Edu? --      Excl. in [place]? --     Vital signs reviewed, nursing assessments reviewed.   Constitutional:   Alert and oriented. Non-toxic appearance. Eyes:   Conjunctivae are normal. EOMI. PERRL. ENT      Head:   Normocephalic with small superficial abrasions on the left cheek, no laceration, no bleeding.      Nose:   No congestion/rhinnorhea.       Mouth/Throat:   MMM, no pharyngeal erythema. No peritonsillar  mass.       Neck:   No meningismus. Full ROM.  Cardiovascular:   RRR. Symmetric bilateral radial and DP pulses.  No murmurs. Cap refill less than 2 seconds. Respiratory:   Normal respiratory effort without tachypnea/retractions. Breath sounds are clear and equal bilaterally. No wheezes/rales/rhonchi. Gastrointestinal:   Soft and nontender. Non distended. There is no CVA tenderness.  No rebound, rigidity, or guarding.  Musculoskeletal:   Normal range of motion in all extremities. No joint effusions.  No lower extremity tenderness.  No edema. Neurologic:   Normal speech and language.  Motor grossly intact. No acute focal neurologic deficits are appreciated.  Skin:    Skin is warm, dry with multiple superficial linear abrasions over the bilateral volar wrist.  No bleeding, no laceration.. No  rash noted.  No petechiae, purpura, or bullae.  ____________________________________________    LABS (pertinent positives/negatives) (all labs ordered are listed, but only abnormal results are displayed) Labs Reviewed  COMPREHENSIVE METABOLIC PANEL - Abnormal; Notable for the following components:      Result Value   Creatinine, Ser 1.12 (*)    Total Protein 8.2 (*)    AST 14 (*)    All other components within normal limits  SALICYLATE LEVEL - Abnormal; Notable for the following components:   Salicylate Lvl <3.7 (*)    All other components within normal limits  ACETAMINOPHEN LEVEL - Abnormal; Notable for the following components:   Acetaminophen (Tylenol), Serum <10 (*)    All other components within normal limits  RESP PANEL BY RT-PCR (FLU A&B, COVID) ARPGX2  ETHANOL  CBC  URINE DRUG SCREEN, QUALITATIVE ([facility] ONLY)  POC URINE PREG, ED   ____________________________________________   EKG    ____________________________________________    RADIOLOGY  No results found.  ____________________________________________   PROCEDURES Procedures  ____________________________________________    CLINICAL IMPRESSION / ASSESSMENT AND PLAN / ED COURSE  Medications ordered in the ED: Medications - No data to display  Pertinent labs & imaging results that were available during my care of the patient were reviewed by me and considered in my medical decision making (see chart for details).   [name] was evaluated in Emergency Department on 11/08/2020 for the symptoms described in the history of present illness. She was evaluated in the context of the global COVID-19 pandemic, which necessitated consideration that the patient might be at risk for infection with the SARS-CoV-2 virus that causes COVID-19. Institutional protocols and algorithms that pertain to the evaluation of patients at risk for COVID-19 are in a state of rapid change based on information released by  regulatory bodies including the CDC and federal and state organizations. These policies and algorithms were followed during the patient's care in the ED.   Patient presents with self-injurious behavior.  Appears calm in the ED, does not require continuous safety sitter, arrives under IVC and will continue IVC until evaluated by psychiatry.  The patient has been placed in psychiatric observation due to the need to provide a safe environment for the patient while obtaining psychiatric consultation and evaluation, as well as ongoing medical and medication management to treat the patient's condition.  The patient has been placed under full IVC at this time.       ____________________________________________   FINAL CLINICAL IMPRESSION(S) / ED DIAGNOSES    Final diagnoses:  Suicidal ideation     ED Discharge Orders    None      Portions of this note were generated with dragon dictation software.  Dictation errors may occur despite best attempts at proofreading.   Carrie Mew, MD 11/08/20 [phone]

## 2020-11-08 NOTE — BH Assessment (Signed)
Referral information for Psychiatric Hospitalization faxed to;   [facility] [phone]),   . [facility] [phone] -or- [phone]),   . [place] [phone])  . [place] [phone]  . [facility] [phone])   [facility] [phone]) Per Shana Chute, facility is at capacity.

## 2020-11-08 NOTE — ED Notes (Signed)
Pink tennis shoes, pink socks, pink leggings, black long sleeve tshirt, peach underwear, white sports bra placed in pt belongings bag and labeled with pt sticker.

## 2020-11-08 NOTE — ED Notes (Signed)
Pt states SI. States plan would be to harm self with any sharp object she could cut her wrists with.

## 2020-11-08 NOTE — ED Notes (Signed)
IVC/Pending Consult 
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
## 2020-11-09 MED ORDER — IBUPROFEN 400 MG PO TABS
400.0000 mg | ORAL_TABLET | Freq: Once | ORAL | Status: AC
  Administered 2020-11-09: 400 mg via ORAL
  Filled 2020-11-09: qty 1

## 2020-11-09 MED ORDER — HYDROXYZINE HCL 25 MG PO TABS
50.0000 mg | ORAL_TABLET | Freq: Once | ORAL | Status: AC
  Administered 2020-11-09: 50 mg via ORAL
  Filled 2020-11-09: qty 2

## 2020-11-09 NOTE — ED Notes (Signed)
IVC/Pending Placement 

## 2020-11-09 NOTE — ED Notes (Signed)
Patient transferred from ED to [facility] room 5 after screening for contraband. Report received from [place], RN including Situation, Background, Assessment and Recommendations. Pt oriented to unit including Q15 minute rounds as well as the security cameras for their protection. Patient is alert and oriented, warm and dry in no acute distress. Patient denies AVH. Pt. Encouraged to let this nurse know if needs arise.

## 2020-11-09 NOTE — ED Notes (Signed)
Patient is IVC pending placement 

## 2020-11-09 NOTE — ED Notes (Signed)
Pt request assistance with sleep and for headache, pt is anxious with presentation and restless since arrival to [facility]. Dr. Charna Archer contacted, awaiting response.

## 2020-11-09 NOTE — ED Notes (Signed)
Pt heading to [place] now with Animal nutritionist and NT.
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
## 2020-11-09 NOTE — ED Notes (Signed)
Hourly rounding completed at this time, patient currently asleep in room. No complaints, stable, and in no acute distress. Q15 minute rounds and monitoring via Security Cameras to continue. 

## 2020-11-09 NOTE — ED Notes (Signed)
Pt given crayons, word search sheet of paper and coloring paper. Pt informed that when she is done with sheets, all crayons should be given back to this tech.

## 2020-11-09 NOTE — BH Assessment (Signed)
Writer followed up with referrals;    [facility] ([phone]),  No beds, pending review.   [facility] [phone] -or- [phone]), Declined due to [place] [phone]),   [facility] Numbers [phone]), State hospital. Will need a regional referral form and not in their catchment area.   [facility] [phone]), unable to reach anyone.   [facility] [phone]) Per Shana Chute, facility is at capacity.    [name] ([phone]), no beds

## 2020-11-09 NOTE — ED Notes (Signed)
Pt up to restroom.

## 2020-11-09 NOTE — ED Notes (Signed)
Hourly rounding completed at this time, patient currently awake in room. No complaints, stable, and in no acute distress. Q15 minute rounds and monitoring via Security Cameras to continue. 

## 2020-11-10 ENCOUNTER — Emergency Department: Payer: Medicaid Other

## 2020-11-10 LAB — URINALYSIS, COMPLETE (UACMP) WITH MICROSCOPIC
Bilirubin Urine: NEGATIVE
Glucose, UA: NEGATIVE mg/dL
Hgb urine dipstick: NEGATIVE
Ketones, ur: NEGATIVE mg/dL
Leukocytes,Ua: NEGATIVE
Nitrite: NEGATIVE
Protein, ur: NEGATIVE mg/dL
Specific Gravity, Urine: 1.033 — ABNORMAL HIGH (ref 1.005–1.030)
pH: 6 (ref 5.0–8.0)

## 2020-11-10 LAB — CBC WITH DIFFERENTIAL/PLATELET
Abs Immature Granulocytes: 0.05 10*3/uL (ref 0.00–0.07)
Basophils Absolute: 0.1 10*3/uL (ref 0.0–0.1)
Basophils Relative: 1 %
Eosinophils Absolute: 0.2 10*3/uL (ref 0.0–0.5)
Eosinophils Relative: 3 %
HCT: 36.5 % (ref 36.0–46.0)
Hemoglobin: 12.3 g/dL (ref 12.0–15.0)
Immature Granulocytes: 1 %
Lymphocytes Relative: 34 %
Lymphs Abs: 2.7 10*3/uL (ref 0.7–4.0)
MCH: 31.3 pg (ref 26.0–34.0)
MCHC: 33.7 g/dL (ref 30.0–36.0)
MCV: 92.9 fL (ref 80.0–100.0)
Monocytes Absolute: 0.9 10*3/uL (ref 0.1–1.0)
Monocytes Relative: 11 %
Neutro Abs: 4.3 10*3/uL (ref 1.7–7.7)
Neutrophils Relative %: 50 %
Platelets: 229 10*3/uL (ref 150–400)
RBC: 3.93 MIL/uL (ref 3.87–5.11)
RDW: 12.2 % (ref 11.5–15.5)
WBC: 8.2 10*3/uL (ref 4.0–10.5)
nRBC: 0 % (ref 0.0–0.2)

## 2020-11-10 LAB — COMPREHENSIVE METABOLIC PANEL
ALT: 10 U/L (ref 0–44)
AST: 14 U/L — ABNORMAL LOW (ref 15–41)
Albumin: 4.2 g/dL (ref 3.5–5.0)
Alkaline Phosphatase: 52 U/L (ref 38–126)
Anion gap: 9 (ref 5–15)
BUN: 21 mg/dL — ABNORMAL HIGH (ref 6–20)
CO2: 24 mmol/L (ref 22–32)
Calcium: 10.3 mg/dL (ref 8.9–10.3)
Chloride: 106 mmol/L (ref 98–111)
Creatinine, Ser: 1.21 mg/dL — ABNORMAL HIGH (ref 0.44–1.00)
GFR, Estimated: >60 mL/min (ref >60)
Glucose, Bld: 113 mg/dL — ABNORMAL HIGH (ref 70–99)
Potassium: 3.7 mmol/L (ref 3.5–5.1)
Sodium: 139 mmol/L (ref 135–145)
Total Bilirubin: 0.5 mg/dL (ref 0.3–1.2)
Total Protein: 7 g/dL (ref 6.5–8.1)

## 2020-11-10 LAB — LIPASE, BLOOD: Lipase: 28 U/L (ref 11–51)

## 2020-11-10 IMAGING — CT CT ABD-PELV W/ CM
2 of 4 series · 16 of 46 positions shown, 18 images · IV contrast (APPLIED)
Comparison: None.

CLINICAL DATA: Vomiting and abdominal pain.

EXAM:
CT ABDOMEN AND PELVIS WITH CONTRAST
TECHNIQUE: Multidetector CT imaging of the abdomen and pelvis was performed
using the standard protocol following bolus administration of
intravenous contrast.
CONTRAST:  100mL OMNIPAQUE IOHEXOL 300 MG/ML  SOLN

[Series 2: routine abd/pel with · axial · 0.87mm/px · z∈[-1074,-644]mm · 13 of 94 slices shown, 15 images]
[im 4/94  soft-tissue]
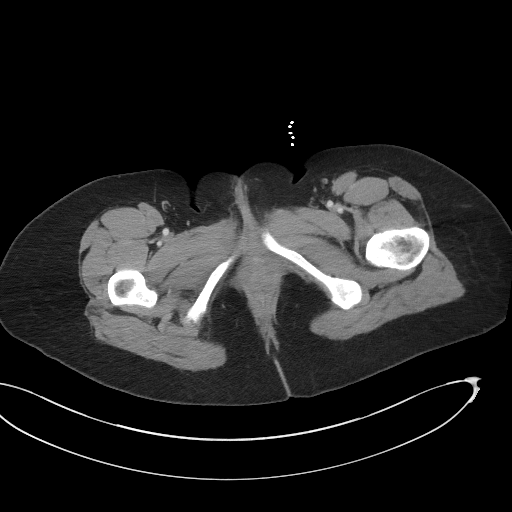
[im 4/94  bone]
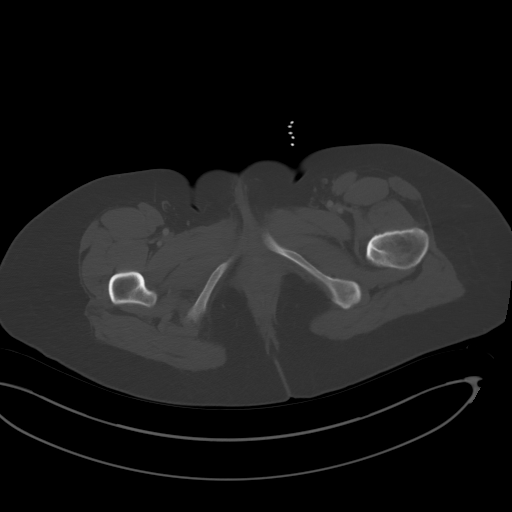
[im 12/94  soft-tissue]
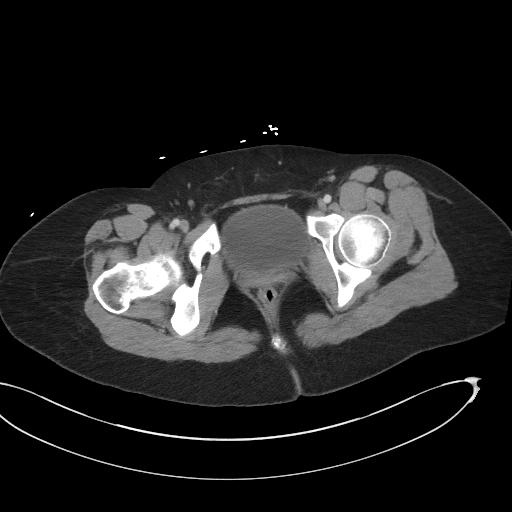
[im 20/94  soft-tissue]
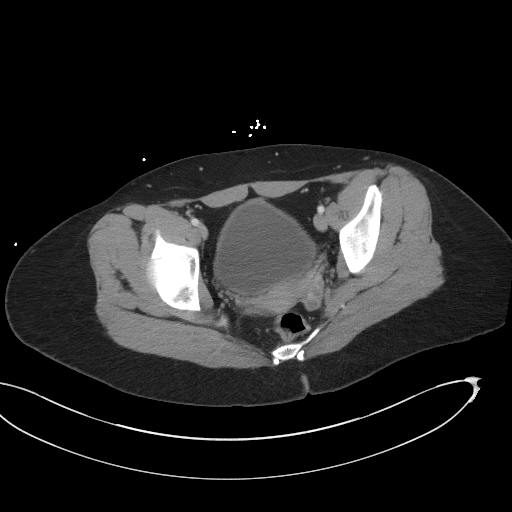
[im 28/94  soft-tissue]
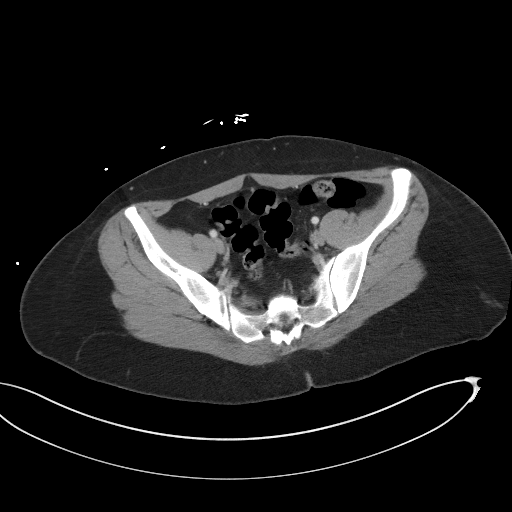
[im 32/94  soft-tissue]
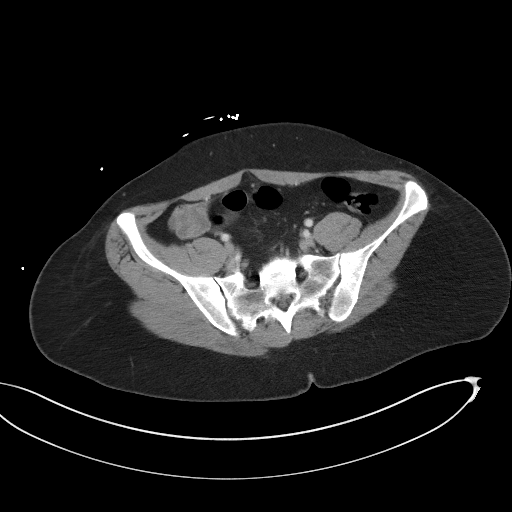
[im 39/94  soft-tissue]
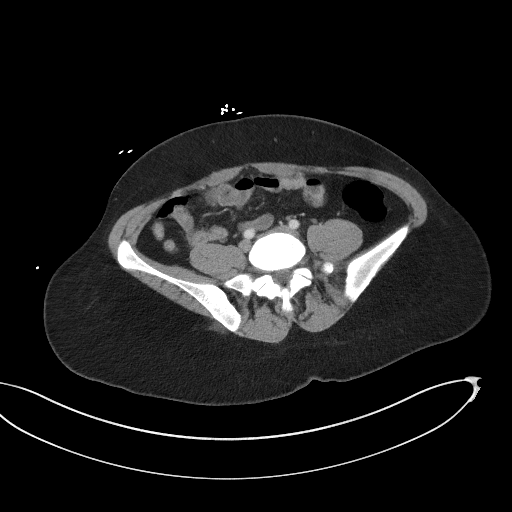
[im 47/94  soft-tissue]
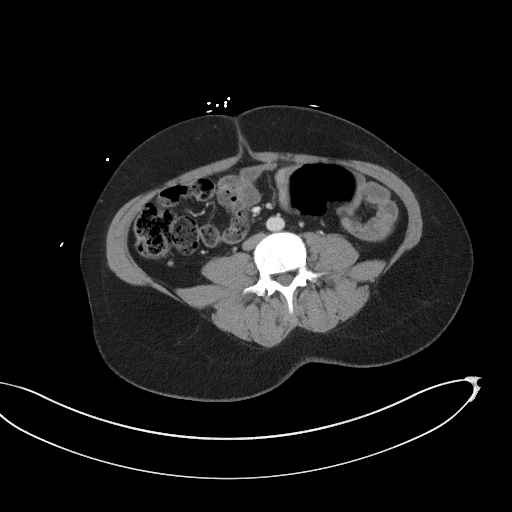
[im 55/94  soft-tissue]
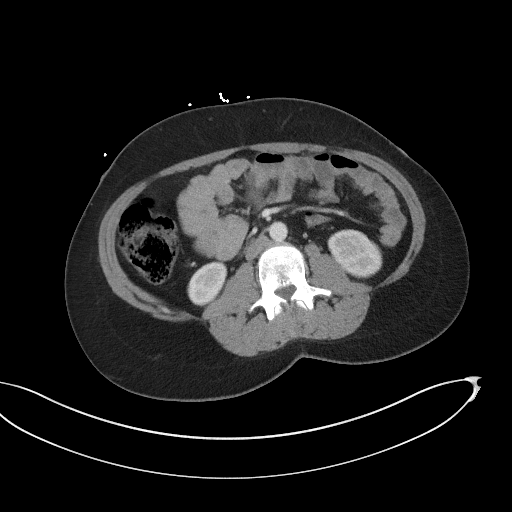
[im 63/94  soft-tissue]
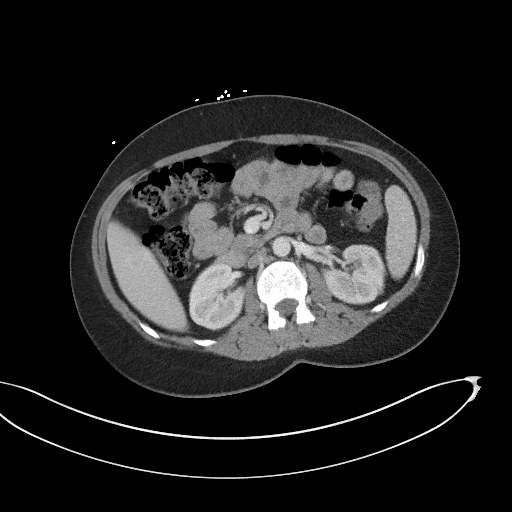
[im 63/94  bone]
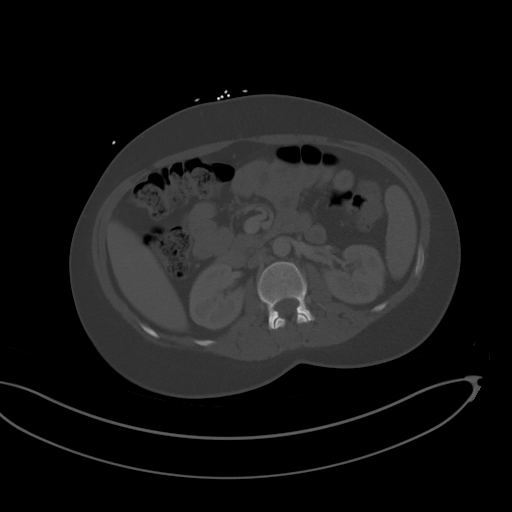
[im 66/94  soft-tissue]
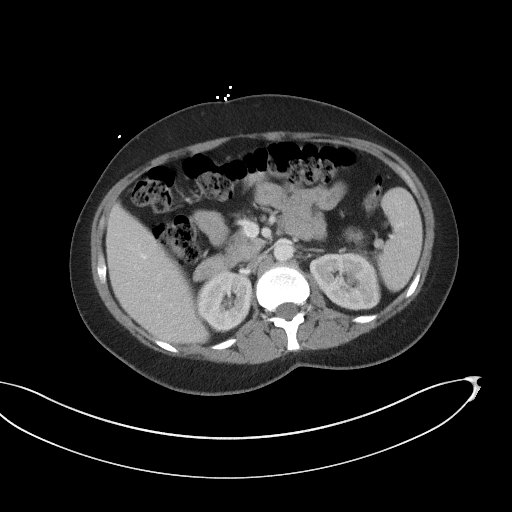
[im 74/94  soft-tissue]
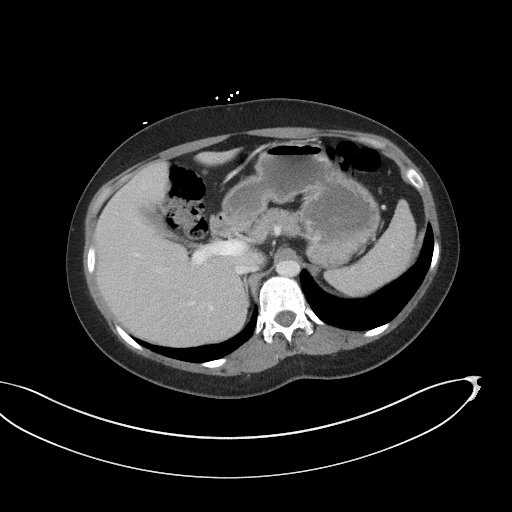
[im 82/94  soft-tissue]
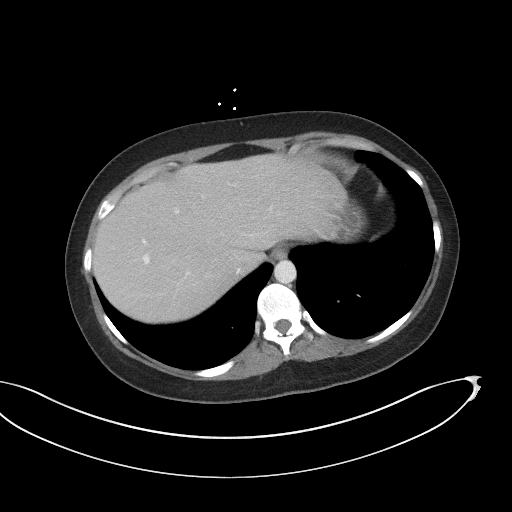
[im 90/94  soft-tissue]
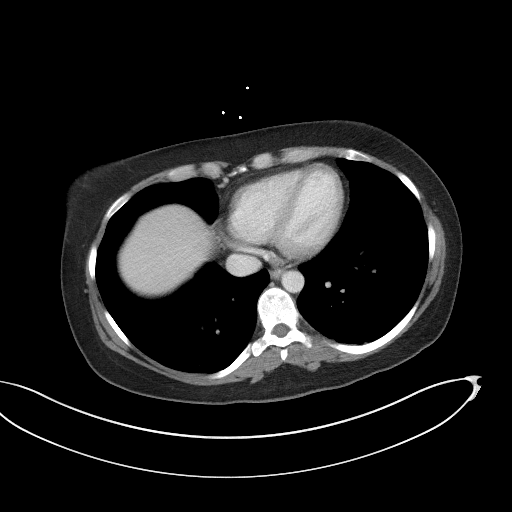

[Series 5: coronal st · coronal · 0.80mm/px · 3 of 91 slices shown]
[im 31/91  soft-tissue]
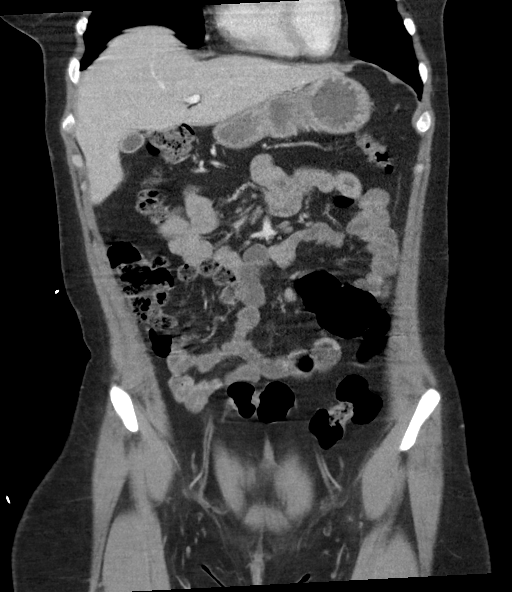
[im 41/91  soft-tissue]
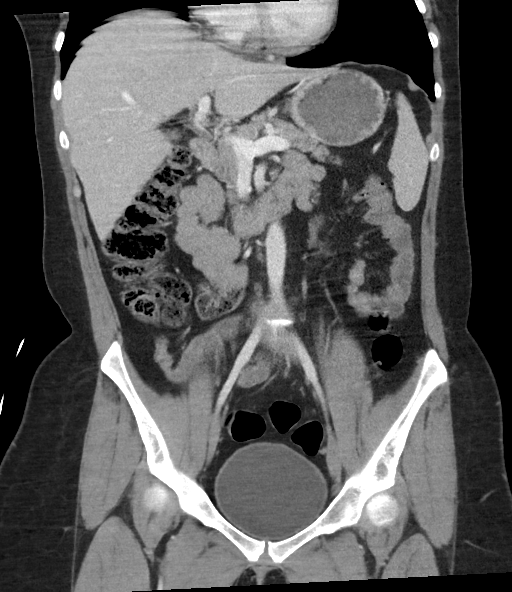
[im 51/91  soft-tissue]
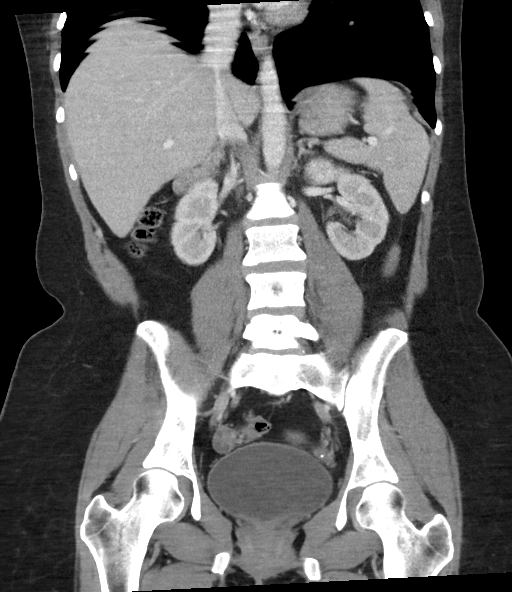

[16 of 46 positions shown; findings below may reference images not displayed]

FINDINGS: Lower chest: No acute abnormality.

Hepatobiliary: No focal liver abnormality is seen. No gallstones,
gallbladder wall thickening, or biliary dilatation.

Pancreas: Unremarkable. No pancreatic ductal dilatation or
surrounding inflammatory changes.

Spleen: Normal in size without focal abnormality.

Adrenals/Urinary Tract: Adrenal glands are unremarkable. Kidneys are
normal, without renal calculi, focal lesion, or hydronephrosis.
Bladder is unremarkable.

Stomach/Bowel: Stomach is within normal limits. Appendix appears
normal. No evidence of bowel wall thickening, distention, or
inflammatory changes.

Vascular/Lymphatic: No significant vascular findings are present. No
enlarged abdominal or pelvic lymph nodes.

Reproductive: The uterus is normal in appearance. Bilateral
subcentimeter ovarian follicles are seen.

Other: No abdominal wall hernia or abnormality. No abdominopelvic
ascites or free air.

Musculoskeletal: No acute or significant osseous findings.
IMPRESSION: No acute intra-abdominal findings.

## 2020-11-10 MED ORDER — IOHEXOL 350 MG/ML SOLN: 100.0000 mL | Freq: Once | INTRAVENOUS | Status: DC | PRN

## 2020-11-10 MED ORDER — ALUM & MAG HYDROXIDE-SIMETH 200-200-20 MG/5ML PO SUSP
30.0000 mL | Freq: Once | ORAL | Status: AC
  Administered 2020-11-10: 30 mL via ORAL
  Filled 2020-11-10: qty 30

## 2020-11-10 MED ORDER — IOHEXOL 300 MG/ML  SOLN
100.0000 mL | Freq: Once | INTRAMUSCULAR | Status: AC | PRN
  Administered 2020-11-10: 100 mL via INTRAVENOUS

## 2020-11-10 MED ORDER — ACETAMINOPHEN 500 MG PO TABS: 1000.0000 mg | ORAL_TABLET | Freq: Once | ORAL | Status: DC

## 2020-11-10 MED ORDER — IBUPROFEN 600 MG PO TABS
600.0000 mg | ORAL_TABLET | Freq: Once | ORAL | Status: AC
  Administered 2020-11-10: 600 mg via ORAL

## 2020-11-10 MED ORDER — ONDANSETRON HCL 4 MG/2ML IJ SOLN
4.0000 mg | Freq: Once | INTRAMUSCULAR | Status: AC
  Administered 2020-11-10: 4 mg via INTRAVENOUS
  Filled 2020-11-10 (×2): qty 2

## 2020-11-10 MED ORDER — ONDANSETRON 4 MG PO TBDP
4.0000 mg | ORAL_TABLET | Freq: Once | ORAL | Status: AC
  Administered 2020-11-10: 4 mg via ORAL
  Filled 2020-11-10 (×2): qty 1

## 2020-11-10 NOTE — BH Assessment (Signed)
Pt currently under review with [facility]

## 2020-11-10 NOTE — ED Notes (Signed)
Meal tray given 

## 2020-11-10 NOTE — ED Provider Notes (Signed)
Patient complaining of severe sharp diffuse abd pain and has vomited a few times at the [facility]. Given PO zofran and she vomited after that. Abd is diffusely tender to palpation with no localized tenderness, rebound or guarding.  Will get repeat labs, urinalysis, and CT abdomen pelvis.  Will give IV Zofran.   _________________________ 4:49 AM on 11/10/2020 -----------------------------------------  CT visualized by me unremarkable, confirmed by radiology.  Labs and urine with no acute changes.  After IV Zofran patient feels better and is tolerating p.o. with no further episodes of vomiting.  Patient cleared at this time.    I have personally reviewed the images performed during this visit and I agree with the Radiologist's read.   Interpretation by Radiologist:  CT ABDOMEN PELVIS W CONTRAST  Result Date: 11/10/2020 CLINICAL DATA:  Vomiting and abdominal pain. EXAM: CT ABDOMEN AND PELVIS WITH CONTRAST TECHNIQUE: Multidetector CT imaging of the abdomen and pelvis was performed using the standard protocol following bolus administration of intravenous contrast. CONTRAST:  161mL OMNIPAQUE IOHEXOL 300 MG/ML  SOLN COMPARISON:  None. FINDINGS: Lower chest: No acute abnormality. Hepatobiliary: No focal liver abnormality is seen. No gallstones, gallbladder wall thickening, or biliary dilatation. Pancreas: Unremarkable. No pancreatic ductal dilatation or surrounding inflammatory changes. Spleen: Normal in size without focal abnormality. Adrenals/Urinary Tract: Adrenal glands are unremarkable. Kidneys are normal, without renal calculi, focal lesion, or hydronephrosis. Bladder is unremarkable. Stomach/Bowel: Stomach is within normal limits. Appendix appears normal. No evidence of bowel wall thickening, distention, or inflammatory changes. Vascular/Lymphatic: No significant vascular findings are present. No enlarged abdominal or pelvic lymph nodes. Reproductive: The uterus is normal in appearance. Bilateral  subcentimeter ovarian follicles are seen. Other: No abdominal wall hernia or abnormality. No abdominopelvic ascites or free air. Musculoskeletal: No acute or significant osseous findings. IMPRESSION: No acute intra-abdominal findings. Electronically Signed   By: Virgina Norfolk M.D.   On: 11/10/2020 03:34      Rudene Re, MD 11/10/20 [phone]

## 2020-11-10 NOTE — ED Notes (Signed)
Hourly rounding completed at this time, patient currently asleep in room. No complaints, stable, and in no acute distress. Q15 minute rounds and monitoring via Security Cameras to continue. 

## 2020-11-10 NOTE — ED Notes (Signed)
This nurse enters room to administer medications, pt states "I throwed up up." this nurse asks if she threw up in restroom and pt shakes her head no and points to the floor on the other side of the bed. Pt then provided with emesis bag, security notified of this. Pt takes medications. Sitting up in bed at this time. Pt states that prior to arrival to [facility] she had milk and ice cream while in [place]. Pt reports that since consumption she has had an "upset stomach" states she does eat foods with dairy typically without difficulty. Emesis cleaned in room by this nurse.

## 2020-11-10 NOTE — ED Notes (Addendum)
Pt woke for UA sample and PO trial, pt able to walk without difficulty, reports nausea better and no more incidences of vomiting  Pt able to drink 12 ounces of water with difficulty or reports of nausea, EDP notified

## 2020-11-10 NOTE — ED Notes (Signed)

## 2020-11-10 NOTE — BH Assessment (Signed)
Referral Check:   Crystal [name] [phone] answer   [facility] [phone]), Per previous writer,[name] Denied pt due to IDD dx.   [facility] [phone]), No answer.   [facility] ([phone])Per pervious writer, [facility] [name], no appropriate beds.   [name] [phone]), No answer.

## 2020-11-10 NOTE — ED Notes (Signed)
Pt asking numerous times why she does not have a bed assignment yet.  RN explained TTS is working to find the first available bed for her.  Pt accepting.

## 2020-11-10 NOTE — ED Notes (Signed)
Dr. Alfred Levins to place orders for medications and states that she will be to [facility] to assess pt.

## 2020-11-10 NOTE — ED Notes (Signed)
Hourly rounding completed at this time, patient currently awake in room. No complaints, stable, and in no acute distress. Q15 minute rounds and monitoring via Security Cameras to continue. 

## 2020-11-10 NOTE — ED Notes (Signed)
Pt. To BHU from ED ambulatory without difficulty, to room  BHU6. Report from [facility]. Pt. Is alert and oriented, warm and dry in no distress. Pt. Denies AVH. Pt states she is SI with thoughts to cut herself and having HI towards her guardian. Patient contracts for safety.  Pt. Calm and cooperative. Pt. Made aware of security cameras and Q15 minute rounds. Pt. Encouraged to let Nursing staff know of any concerns or needs.

## 2020-11-10 NOTE — ED Notes (Signed)
Pt awake at this time, goes to restroom, back to room, sits down and then returns to nurses desk complaining of abdominal pain, nausea and trouble sleeping (despite pt being asleep since 9:45). Dr. Alfred Levins messaged at this time

## 2020-11-10 NOTE — ED Notes (Signed)
Pt given Sprite 

## 2020-11-10 NOTE — ED Notes (Signed)
Hourly rounding completed at this time, patient currently awake in room. No complaints, stable, and in no acute distress. Q15 minute rounds and monitoring via Rover and Officer to continue. °

## 2020-11-10 NOTE — ED Notes (Signed)
Hourly rounding completed at this time, patient currently asleep in room. No complaints, stable, and in no acute distress. Q15 minute rounds and monitoring via Rover and Officer to continue. 

## 2020-11-10 NOTE — ED Notes (Signed)
Pt continues to express SI if she returns to [facility]. PT reports HI toward [facility].Pt has same story as last night. Pt unhappy due to no bed assignment yet from bed search for placement. Complains of headache and requests ibuprofen

## 2020-11-10 NOTE — BH Assessment (Addendum)
Writer followed up with referrals;   [facility] ([phone]),  No beds, pending review.   [facility] [phone]), [place] pt due to IDD dx.   [facility] [phone]), No answer.   [facility] ([phone])Per [facility] [name], no appropriate beds.   [name] [phone]), no answer.

## 2020-11-11 ENCOUNTER — Encounter: Payer: Self-pay | Admitting: Psychiatry

## 2020-11-11 ENCOUNTER — Inpatient Hospital Stay
Admission: AD | Admit: 2020-11-11 | Discharge: 2020-11-14 | DRG: 882 | Disposition: A | Discharge status: Home / Self Care | Payer: Medicaid Other | Source: Intra-hospital | Attending: Behavioral Health | Admitting: Behavioral Health

## 2020-11-11 ENCOUNTER — Other Ambulatory Visit: Payer: Self-pay

## 2020-11-11 DIAGNOSIS — Z793 Long term (current) use of hormonal contraceptives: Secondary | ICD-10-CM

## 2020-11-11 DIAGNOSIS — Z79899 Other long term (current) drug therapy: Secondary | ICD-10-CM | POA: Diagnosis not present

## 2020-11-11 DIAGNOSIS — F419 Anxiety disorder, unspecified: Secondary | ICD-10-CM | POA: Diagnosis present

## 2020-11-11 DIAGNOSIS — F4325 Adjustment disorder with mixed disturbance of emotions and conduct: Principal | ICD-10-CM

## 2020-11-11 DIAGNOSIS — F70 Mild intellectual disabilities: Secondary | ICD-10-CM | POA: Diagnosis present

## 2020-11-11 DIAGNOSIS — Z9152 Personal history of nonsuicidal self-harm: Secondary | ICD-10-CM

## 2020-11-11 DIAGNOSIS — F32A Depression, unspecified: Secondary | ICD-10-CM | POA: Diagnosis present

## 2020-11-11 DIAGNOSIS — Z046 Encounter for general psychiatric examination, requested by authority: Secondary | ICD-10-CM | POA: Diagnosis not present

## 2020-11-11 DIAGNOSIS — Z791 Long term (current) use of non-steroidal anti-inflammatories (NSAID): Secondary | ICD-10-CM | POA: Diagnosis not present

## 2020-11-11 DIAGNOSIS — Z21 Asymptomatic human immunodeficiency virus [HIV] infection status: Secondary | ICD-10-CM | POA: Diagnosis present

## 2020-11-11 DIAGNOSIS — B20 Human immunodeficiency virus [HIV] disease: Secondary | ICD-10-CM | POA: Diagnosis present

## 2020-11-11 DIAGNOSIS — R45851 Suicidal ideations: Secondary | ICD-10-CM | POA: Diagnosis present

## 2020-11-11 LAB — URINE CULTURE: Culture: <10000 — AB

## 2020-11-11 LAB — RESP PANEL BY RT-PCR (FLU A&B, COVID) ARPGX2
Influenza A by PCR: NEGATIVE
Influenza B by PCR: NEGATIVE
SARS Coronavirus 2 by RT PCR: NEGATIVE

## 2020-11-11 MED ORDER — IBUPROFEN 600 MG PO TABS
600.0000 mg | ORAL_TABLET | Freq: Two times a day (BID) | ORAL | Status: DC
  Administered 2020-11-11 – 2020-11-14 (×7): 600 mg via ORAL
  Filled 2020-11-11 (×7): qty 1

## 2020-11-11 MED ORDER — BUSPIRONE HCL 15 MG PO TABS
30.0000 mg | ORAL_TABLET | Freq: Two times a day (BID) | ORAL | Status: DC
  Administered 2020-11-11 – 2020-11-14 (×7): 30 mg via ORAL
  Filled 2020-11-11: qty 2
  Filled 2020-11-11: qty 6
  Filled 2020-11-11: qty 2
  Filled 2020-11-11: qty 6
  Filled 2020-11-11: qty 2
  Filled 2020-11-11 (×2): qty 6
  Filled 2020-11-11 (×4): qty 2
  Filled 2020-11-11: qty 6
  Filled 2020-11-11: qty 2

## 2020-11-11 MED ORDER — ARIPIPRAZOLE 10 MG PO TABS
20.0000 mg | ORAL_TABLET | Freq: Every day | ORAL | Status: DC
  Administered 2020-11-11 – 2020-11-14 (×4): 20 mg via ORAL
  Filled 2020-11-11 (×4): qty 2

## 2020-11-11 MED ORDER — EMTRICITAB-RILPIVIR-TENOFOV AF 200-25-25 MG PO TABS
1.0000 | ORAL_TABLET | Freq: Every day | ORAL | Status: DC
  Administered 2020-11-12 – 2020-11-14 (×3): 1 via ORAL
  Filled 2020-11-11 (×4): qty 1

## 2020-11-11 MED ORDER — MELATONIN 3 MG PO TABS
6.0000 mg | ORAL_TABLET | Freq: Every day | ORAL | Status: DC
  Administered 2020-11-11 – 2020-11-12 (×2): 6 mg via ORAL
  Filled 2020-11-11 (×4): qty 2

## 2020-11-11 MED ORDER — SERTRALINE HCL 25 MG PO TABS
150.0000 mg | ORAL_TABLET | Freq: Every day | ORAL | Status: DC
  Administered 2020-11-11 – 2020-11-14 (×4): 150 mg via ORAL
  Filled 2020-11-11 (×4): qty 2

## 2020-11-11 MED ORDER — CHLORHEXIDINE GLUCONATE 0.12 % MT SOLN
15.0000 mL | Freq: Two times a day (BID) | OROMUCOSAL | Status: DC
  Administered 2020-11-11: 15 mL via OROMUCOSAL
  Filled 2020-11-11 (×8): qty 15

## 2020-11-11 MED ORDER — ALUM & MAG HYDROXIDE-SIMETH 200-200-20 MG/5ML PO SUSP
30.0000 mL | ORAL | Status: DC | PRN
  Administered 2020-11-11 – 2020-11-14 (×4): 30 mL via ORAL
  Filled 2020-11-11 (×5): qty 30

## 2020-11-11 MED ORDER — MAGNESIUM HYDROXIDE 400 MG/5ML PO SUSP: 30.0000 mL | Freq: Every day | ORAL | Status: DC | PRN

## 2020-11-11 MED ORDER — MEDROXYPROGESTERONE ACETATE 150 MG/ML IM SUSP
150.0000 mg | INTRAMUSCULAR | Status: DC
  Administered 2020-11-11: 150 mg via INTRAMUSCULAR
  Filled 2020-11-11: qty 1

## 2020-11-11 MED ORDER — IBUPROFEN 600 MG PO TABS
600.0000 mg | ORAL_TABLET | Freq: Four times a day (QID) | ORAL | Status: DC | PRN
  Administered 2020-11-11: 600 mg via ORAL
  Filled 2020-11-11: qty 1

## 2020-11-11 MED ORDER — DOLUTEGRAVIR SODIUM 50 MG PO TABS
50.0000 mg | ORAL_TABLET | Freq: Every day | ORAL | Status: DC
  Administered 2020-11-11 – 2020-11-14 (×4): 50 mg via ORAL
  Filled 2020-11-11 (×6): qty 1

## 2020-11-11 MED ORDER — ACETAMINOPHEN 325 MG PO TABS: 650.0000 mg | ORAL_TABLET | Freq: Four times a day (QID) | ORAL | Status: DC | PRN

## 2020-11-11 MED ORDER — HYDROXYZINE HCL 50 MG PO TABS
100.0000 mg | ORAL_TABLET | Freq: Every day | ORAL | Status: DC
  Administered 2020-11-11 – 2020-11-12 (×2): 100 mg via ORAL
  Filled 2020-11-11 (×3): qty 2

## 2020-11-11 MED ORDER — DOCUSATE SODIUM 100 MG PO CAPS
100.0000 mg | ORAL_CAPSULE | Freq: Two times a day (BID) | ORAL | Status: DC
  Administered 2020-11-11 – 2020-11-14 (×7): 100 mg via ORAL
  Filled 2020-11-11 (×7): qty 1

## 2020-11-11 MED ORDER — DIPHENHYDRAMINE HCL 2 % EX CREA
1.0000 "application " | TOPICAL_CREAM | Freq: Two times a day (BID) | CUTANEOUS | Status: DC | PRN
  Filled 2020-11-11: qty 15

## 2020-11-11 NOTE — Plan of Care (Signed)
Patient new to the unit today, hasn't had time to progress  Problem: Education: Goal: Emotional status will improve Outcome: Not Progressing Goal: Mental status will improve Outcome: Not Progressing   
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
## 2020-11-11 NOTE — Consult Note (Signed)
[facility] Consult   Reason for Consult: Follow-up consult 31 year old woman history of intellectual disability and self injury Referring Physician: Paduchowski Patient Identification: Crystal [name] MRN:  [id number] Principal Diagnosis: Mild intellectual disability Diagnosis:  Principal Problem:   Mild intellectual disability Active Problems:   Adjustment disorder with mixed disturbance of emotions and conduct   Self-inflicted laceration of wrist, initial encounter ([place])   HIV (human immunodeficiency virus infection) ([place])   Total Time spent with patient: 30 minutes  Subjective:   Crystal [name] is a 30 y.o. female patient admitted with "I need inpatient".  HPI: Patient seen chart reviewed.  Patient last seen on Friday when she had come back to the emergency room with superficial scratches to her wrists and statements that she felt suicidal and unsafe going back to her group home.  Patient has not done anything more to harm her self over the weekend but continues to state that she feels unsafe if she goes back to her group home and is having thoughts about hurting herself.  Otherwise generally cooperative not aggressive on the unit.  Past Psychiatric History: Longstanding history of intellectual disability mood instability behavior problems self injury  Risk to Self:   Risk to Others:   Prior Inpatient Therapy:   Prior Outpatient Therapy:    Past Medical History:  Past Medical History:  Diagnosis Date  . Cancer ([place])    skin  . Depression   . HIV (human immunodeficiency virus infection) ([place])     Past Surgical History:  Procedure Laterality Date  . SKIN SURGERY     Family History: History reviewed. No pertinent family history. Family Psychiatric  History: None known Social History:  Social History   Substance and Sexual Activity  Alcohol Use Not Currently     Social History   Substance and Sexual Activity  Drug Use Not Currently    Social  History   Socioeconomic History  . Marital status: Single    Spouse name: Not on file  . Number of children: Not on file  . Years of education: Not on file  . Highest education level: Not on file  Occupational History  . Not on file  Tobacco Use  . Smoking status: Current Every Day Smoker    Packs/day: 0.50  . Smokeless tobacco: Never Used  Substance and Sexual Activity  . Alcohol use: Not Currently  . Drug use: Not Currently  . Sexual activity: Not on file  Other Topics Concern  . Not on file  Social History Narrative  . Not on file   Social Determinants of Health   Financial Resource Strain: Not on file  Food Insecurity: Not on file  Transportation Needs: Not on file  Physical Activity: Not on file  Stress: Not on file  Social Connections: Not on file   Additional Social History:    Allergies:  No Known Allergies  Labs:  Results for orders placed or performed during the hospital encounter of 11/08/20 (from the past 48 hour(s))  Urinalysis, Complete w Microscopic Urine, Clean Catch     Status: Abnormal   Collection Time: 11/10/20  2:55 AM  Result Value Ref Range   Color, Urine STRAW (A) YELLOW   APPearance CLEAR (A) CLEAR   Specific Gravity, Urine 1.033 (H) 1.005 - 1.030   pH 6.0 5.0 - 8.0   Glucose, UA NEGATIVE NEGATIVE mg/dL   Hgb urine dipstick NEGATIVE NEGATIVE   Bilirubin Urine NEGATIVE NEGATIVE   Ketones, ur NEGATIVE NEGATIVE mg/dL  Protein, ur NEGATIVE NEGATIVE mg/dL   Nitrite NEGATIVE NEGATIVE   Leukocytes,Ua NEGATIVE NEGATIVE   RBC / HPF 0-5 0 - 5 RBC/hpf   WBC, UA 0-5 0 - 5 WBC/hpf   Bacteria, UA RARE (A) NONE SEEN   Squamous Epithelial / LPF 0-5 0 - 5   Mucus PRESENT     Comment: Performed at [facility], [address] [place], [place] [address]  CBC with Differential     Status: None   Collection Time: 11/10/20  2:55 AM  Result Value Ref Range   WBC 8.2 4.0 - 10.5 K/uL   RBC 3.93 3.87 - 5.11 MIL/uL   Hemoglobin 12.3 12.0 -  15.0 g/dL   HCT 36.5 36.0 - 46.0 %   MCV 92.9 80.0 - 100.0 fL   MCH 31.3 26.0 - 34.0 pg   MCHC 33.7 30.0 - 36.0 g/dL   RDW 12.2 11.5 - 15.5 %   Platelets 229 150 - 400 K/uL   nRBC 0.0 0.0 - 0.2 %   Neutrophils Relative % 50 %   Neutro Abs 4.3 1.7 - 7.7 K/uL   Lymphocytes Relative 34 %   Lymphs Abs 2.7 0.7 - 4.0 K/uL   Monocytes Relative 11 %   Monocytes Absolute 0.9 0.1 - 1.0 K/uL   Eosinophils Relative 3 %   Eosinophils Absolute 0.2 0.0 - 0.5 K/uL   Basophils Relative 1 %   Basophils Absolute 0.1 0.0 - 0.1 K/uL   Immature Granulocytes 1 %   Abs Immature Granulocytes 0.05 0.00 - 0.07 K/uL    Comment: Performed at [facility], [address] [place], [place] [address]  Comprehensive metabolic panel     Status: Abnormal   Collection Time: 11/10/20  2:55 AM  Result Value Ref Range   Sodium 139 135 - 145 mmol/L   Potassium 3.7 3.5 - 5.1 mmol/L   Chloride 106 98 - 111 mmol/L   CO2 24 22 - 32 mmol/L   Glucose, Bld 113 (H) 70 - 99 mg/dL    Comment: Glucose reference range applies only to samples taken after fasting for at least 8 hours.   BUN 21 (H) 6 - 20 mg/dL   Creatinine, Ser 1.21 (H) 0.44 - 1.00 mg/dL   Calcium 10.3 8.9 - 10.3 mg/dL   Total Protein 7.0 6.5 - 8.1 g/dL   Albumin 4.2 3.5 - 5.0 g/dL   AST 14 (L) 15 - 41 U/L   ALT 10 0 - 44 U/L   Alkaline Phosphatase 52 38 - 126 U/L   Total Bilirubin 0.5 0.3 - 1.2 mg/dL   GFR, Estimated >60 >60 mL/min    Comment: (NOTE) Calculated using the CKD-EPI Creatinine Equation (2021)    Anion gap 9 5 - 15    Comment: Performed at [facility], [address] [place], [place] [address]  Lipase, blood     Status: None   Collection Time: 11/10/20  2:55 AM  Result Value Ref Range   Lipase 28 11 - 51 U/L    Comment: Performed at [facility], [address], [place], [place] [address]  Urine Culture     Status: Abnormal   Collection Time: 11/10/20  2:55 AM   Specimen: Urine, Random  Result Value Ref  Range   Specimen Description      URINE, RANDOM Performed at [facility], [address], [place], [place] [address]    Special Requests      NONE  Performed at [facility], [address], [place], [place] [address]    Culture (A)     <10,000 COLONIES/mL INSIGNIFICANT GROWTH Performed at [facility] [address], [place], [place] [address]    Report Status 11/11/2020 FINAL   Resp Panel by RT-PCR (Flu A&B, Covid) Nasopharyngeal Swab     Status: None   Collection Time: 11/11/20 10:44 AM   Specimen: Nasopharyngeal Swab; Nasopharyngeal(NP) swabs in vial transport medium  Result Value Ref Range   SARS Coronavirus 2 by RT PCR NEGATIVE NEGATIVE    Comment: (NOTE) SARS-CoV-2 target nucleic acids are NOT DETECTED.  The SARS-CoV-2 RNA is generally detectable in upper respiratory specimens during the acute phase of infection. The lowest concentration of SARS-CoV-2 viral copies this assay can detect is 138 copies/mL. A negative result does not preclude SARS-Cov-2 infection and should not be used as the sole basis for treatment or other patient management decisions. A negative result may occur with  improper specimen collection/handling, submission of specimen other than nasopharyngeal swab, presence of viral mutation(s) within the areas targeted by this assay, and inadequate number of viral copies(<138 copies/mL). A negative result must be combined with clinical observations, patient history, and epidemiological information. The expected result is Negative.  Fact Sheet for Patients:  EntrepreneurPulse.com.au  Fact Sheet for Healthcare Providers:  IncredibleEmployment.be  This test is no t yet approved or cleared by the [place] FDA and  has been authorized for detection and/or diagnosis of SARS-CoV-2 by FDA under an Emergency Use Authorization (EUA). This EUA will remain  in effect (meaning this test can be used)  for the duration of the COVID-19 declaration under Section 564(b)(1) of the Act, 21 U.S.C.section 360bbb-3(b)(1), unless the authorization is terminated  or revoked sooner.       Influenza A by PCR NEGATIVE NEGATIVE   Influenza B by PCR NEGATIVE NEGATIVE    Comment: (NOTE) The Xpert Xpress SARS-CoV-2/FLU/RSV plus assay is intended as an aid in the diagnosis of influenza from Nasopharyngeal swab specimens and should not be used as a sole basis for treatment. Nasal washings and aspirates are unacceptable for Xpert Xpress SARS-CoV-2/FLU/RSV testing.  Fact Sheet for Patients: EntrepreneurPulse.com.au  Fact Sheet for Healthcare Providers: IncredibleEmployment.be  This test is not yet approved or cleared by the [place] FDA and has been authorized for detection and/or diagnosis of SARS-CoV-2 by FDA under an Emergency Use Authorization (EUA). This EUA will remain in effect (meaning this test can be used) for the duration of the COVID-19 declaration under Section 564(b)(1) of the Act, 21 U.S.C. section 360bbb-3(b)(1), unless the authorization is terminated or revoked.  Performed at [facility], [address], [place], [place] [address]     Current Facility-Administered Medications  Medication Dose Route Frequency Provider Last Rate Last Admin  . ibuprofen (ADVIL) tablet 600 mg  600 mg Oral Q6H PRN Harvest Dark, MD   600 mg at 11/11/20 0830   Current Outpatient Medications  Medication Sig Dispense Refill  . ARIPiprazole (ABILIFY) 20 MG tablet Take 20 mg by mouth daily.    . busPIRone (BUSPAR) 30 MG tablet Take 30 mg by mouth 2 (two) times daily.    . chlorhexidine (PERIDEX) 0.12 % solution Use as directed 15 mLs in the mouth or throat 2 (two) times daily.    . diphenhydrAMINE (BENADRYL) 2 % cream Apply 1 application topically 2 (two) times daily as needed for itching. (apply to feet)    . docusate sodium (COLACE)  100 MG  capsule Take 100 mg by mouth 2 (two) times daily.    . dolutegravir (TIVICAY) 50 MG tablet Take 50 mg by mouth daily.    Marland Kitchen emtricitabine-rilpivir-tenofovir DF (COMPLERA) 200-25-300 MG tablet Take 1 tablet by mouth daily.    . hydrOXYzine (VISTARIL) 25 MG capsule Take 100 mg by mouth at bedtime.    Marland Kitchen ibuprofen (ADVIL) 600 MG tablet Take 600 mg by mouth 2 (two) times daily.    . medroxyPROGESTERone (DEPO-PROVERA) 150 MG/ML injection Inject 150 mg into the muscle every 3 (three) months.    . melatonin 3 MG TABS tablet Take 6 mg by mouth at bedtime.    . sertraline (ZOLOFT) 100 MG tablet Take 150 mg by mouth daily.      Musculoskeletal: Strength & Muscle Tone: within normal limits Gait & Station: normal Patient leans: N/A  Psychiatric Specialty Exam: Physical Exam Vitals and nursing note reviewed.  Constitutional:      Appearance: She is well-developed and well-nourished.  HENT:     Head: Normocephalic and atraumatic.  Eyes:     Conjunctiva/sclera: Conjunctivae normal.     Pupils: Pupils are equal, round, and reactive to light.  Cardiovascular:     Heart sounds: Normal heart sounds.  Pulmonary:     Effort: Pulmonary effort is normal.  Abdominal:     Palpations: Abdomen is soft.  Musculoskeletal:        General: Normal range of motion.     Cervical back: Normal range of motion.  Skin:    General: Skin is warm and dry.       Neurological:     Mental Status: She is alert.  Psychiatric:        Attention and Perception: Attention normal.        Mood and Affect: Mood is anxious.        Speech: Speech is delayed.        Behavior: Behavior is cooperative.        Thought Content: Thought content is not delusional. Thought content includes suicidal ideation.        Cognition and Memory: Cognition is impaired. Memory is impaired.        Judgment: Judgment is impulsive.     Review of Systems  Constitutional: Negative.   HENT: Negative.   Eyes: Negative.   Respiratory: Negative.    Cardiovascular: Negative.   Gastrointestinal: Negative.   Musculoskeletal: Negative.   Skin: Negative.   Neurological: Negative.   Psychiatric/Behavioral: Positive for decreased concentration, dysphoric mood, self-injury and suicidal ideas.    Blood pressure 100/65, pulse 85, temperature 97.9 F (36.6 C), temperature source Oral, resp. rate 16, height 5\' 2"  (1.575 m), weight 68 kg, last menstrual period 10/22/2020, SpO2 100 %.Body mass index is 27.42 kg/m.  General Appearance: Casual  Eye Contact:  Good  Speech:  Clear and Coherent  Volume:  Decreased  Mood:  Anxious and Dysphoric  Affect:  Congruent  Thought Process:  Disorganized  Orientation:  Full (Time, Place, and Person)  Thought Content:  Illogical and Rumination  Suicidal Thoughts:  Yes.  without intent/plan  Homicidal Thoughts:  No  Memory:  Immediate;   Fair Recent;   Fair Remote;   Fair  Judgement:  Impaired  Insight:  Shallow  Psychomotor Activity:  Decreased  Concentration:  Concentration: Poor  Recall:  Poor  Fund of Knowledge:  Poor  Language:  Fair  Akathisia:  No  Handed:  Right  AIMS (if indicated):  Assets:  Desire for Improvement Resilience  ADL's:  Impaired  Cognition:  Impaired,  Mild  Sleep:        Treatment Plan Summary: Plan Patient continues to voice suicidal ideation.  Continues to complain of anxiety and depression.  Guardian has been contacted and it sounds like they are in agreement that she would be unsafe with continued behavior problems if she went back to her group home.  We will be planning on likely admission to the inpatient unit today continue current medicine and engage in individual and group therapy reassess for discharge planning.  15-minute checks for now.  Disposition: Recommend psychiatric Inpatient admission when medically cleared. Supportive therapy provided about ongoing stressors.  Alethia Berthold, MD 11/11/2020 2:21 PM

## 2020-11-11 NOTE — H&P (Signed)
Psychiatric Admission Assessment Adult  Patient Identification: Crystal [name] MRN:  [id number] Date of Evaluation:  11/11/2020 Chief Complaint:  Adjustment disorder with mixed disturbance of emotions and conduct [F43.25] Principal Diagnosis: Adjustment disorder with mixed disturbance of emotions and conduct Diagnosis:  Principal Problem:   Adjustment disorder with mixed disturbance of emotions and conduct Active Problems:   HIV (human immunodeficiency virus infection) ([place])  CC: "I don't feel safe going back to group home."  History of Present Illness: Patient is a 30 year old female with 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 Wounds are superficial without need for suturing or medical attention. She continues to state she feels unsafe returning to group home because she feels the manager has threatened her, and she feels that another staff member has been talking about her on the phone. She states she will continue to harm herself if she returns to the group home. She denies homicidal ideations, visual hallucinations, and auditory hallucinations. She is a limited historian, and can only tell me she has gone to the hospital three times recently both at [facility] and [facility]. She denies feeling that medications have been helpful. She notes the only thing that is helpful is being inpatient. She states her goals are to get a new DSS guardian for this county, and to go to a different group home. While she continues to state she is feeling suicidal she denies any symptoms of depression such as change in sleep, change in appetite, change in concentration, change in energy, anhedonia, hopelessness, helplessness, or worthlessness. Aside from impulsivity, she also denies symptoms of mania or anxiety.   Associated Signs/Symptoms: Depression Symptoms:  suicidal thoughts with  specific plan, Duration of Depression Symptoms: Greater than two weeks  (Hypo) Manic Symptoms:  Impulsivity, Anxiety Symptoms:  Denies Psychotic Symptoms:  Denies Duration of Psychotic Symptoms: Greater than six months  PTSD Symptoms: Negative Total Time spent with patient: 1 hour  Past Psychiatric History: Patient says she has had several inpatient hospitalizations before.  [place] and [address] she remembers.  She has a history of cutting, and cut her wrists superficially prior to this admission. She is unaware of the names of her medications. However, she nods her head when I list Ablify, Buspar, and Zoloft are listed in the chart.  Her presentation seems very typical of intellectual disability with severe emotional and institutional impoverishment  Is the patient at risk to self? Yes.    Has the patient been a risk to self in the past 6 months? Yes.    Has the patient been a risk to self within the distant past? Yes.    Is the patient a risk to others? No.  Has the patient been a risk to others in the past 6 months? No.  Has the patient been a risk to others within the distant past? No.   Prior Inpatient Therapy:   Prior Outpatient Therapy:    Alcohol Screening:   Substance Abuse History in the last 12 months:  No. Consequences of Substance Abuse: Negative Previous Psychotropic Medications: Yes  Psychological Evaluations: Yes  Past Medical History:  Past Medical History:  Diagnosis Date  . Cancer ([place])    skin  . Depression   . HIV (human immunodeficiency virus infection) ([place])     Past Surgical History:  Procedure Laterality Date  . SKIN SURGERY     Family History: No family history on file. Family Psychiatric  History: Patient is unaware of any family history of mental illness, substance abuse, or suicide attempts Tobacco Screening:   Social History:  Social History   Substance and Sexual Activity  Alcohol Use Not Currently     Social History    Substance and Sexual Activity  Drug Use Not Currently    Additional Social History:                           Allergies:  No Known Allergies Lab Results:  Results for orders placed or performed during the hospital encounter of 11/08/20 (from the past 48 hour(s))  Urinalysis, Complete w Microscopic Urine, Clean Catch     Status: Abnormal   Collection Time: 11/10/20  2:55 AM  Result Value Ref Range   Color, Urine STRAW (A) YELLOW   APPearance CLEAR (A) CLEAR   Specific Gravity, Urine 1.033 (H) 1.005 - 1.030   pH 6.0 5.0 - 8.0   Glucose, UA NEGATIVE NEGATIVE mg/dL   Hgb urine dipstick NEGATIVE NEGATIVE   Bilirubin Urine NEGATIVE NEGATIVE   Ketones, ur NEGATIVE NEGATIVE mg/dL   Protein, ur NEGATIVE NEGATIVE mg/dL   Nitrite NEGATIVE NEGATIVE   Leukocytes,Ua NEGATIVE NEGATIVE   RBC / HPF 0-5 0 - 5 RBC/hpf   WBC, UA 0-5 0 - 5 WBC/hpf   Bacteria, UA RARE (A) NONE SEEN   Squamous Epithelial / LPF 0-5 0 - 5   Mucus PRESENT     Comment: Performed at [facility], [address] [place], [place] [address]  CBC with Differential     Status: None   Collection Time: 11/10/20  2:55 AM  Result Value Ref Range   WBC 8.2 4.0 - 10.5 K/uL   RBC 3.93 3.87 - 5.11 MIL/uL   Hemoglobin 12.3 12.0 - 15.0 g/dL   HCT 36.5 36.0 - 46.0 %   MCV 92.9 80.0 - 100.0 fL   MCH 31.3 26.0 - 34.0 pg   MCHC 33.7 30.0 - 36.0 g/dL   RDW 12.2 11.5 - 15.5 %   Platelets 229 150 - 400 K/uL   nRBC 0.0 0.0 - 0.2 %   Neutrophils Relative % 50 %   Neutro Abs 4.3 1.7 - 7.7 K/uL   Lymphocytes Relative 34 %   Lymphs Abs 2.7 0.7 - 4.0 K/uL   Monocytes Relative 11 %   Monocytes Absolute 0.9 0.1 - 1.0 K/uL   Eosinophils Relative 3 %   Eosinophils Absolute 0.2 0.0 - 0.5 K/uL   Basophils Relative 1 %   Basophils Absolute 0.1 0.0 - 0.1 K/uL   Immature Granulocytes 1 %   Abs Immature Granulocytes 0.05 0.00 - 0.07 K/uL    Comment: Performed at [facility], [address] [place],  [place] [address]  Comprehensive metabolic panel     Status: Abnormal   Collection Time: 11/10/20  2:55 AM  Result Value Ref Range   Sodium 139 135 - 145 mmol/L   Potassium 3.7 3.5 - 5.1 mmol/L   Chloride 106 98 - 111 mmol/L   CO2 24 22 - 32 mmol/L   Glucose, Bld 113 (H) 70 - 99 mg/dL    Comment: Glucose reference range applies only to samples taken after fasting for at least 8 hours.   BUN 21 (H) 6 - 20 mg/dL   Creatinine, Ser 1.21 (H) 0.44 - 1.00 mg/dL   Calcium 10.3 8.9 - 10.3 mg/dL   Total Protein 7.0 6.5 -  8.1 g/dL   Albumin 4.2 3.5 - 5.0 g/dL   AST 14 (L) 15 - 41 U/L   ALT 10 0 - 44 U/L   Alkaline Phosphatase 52 38 - 126 U/L   Total Bilirubin 0.5 0.3 - 1.2 mg/dL   GFR, Estimated >60 >60 mL/min    Comment: (NOTE) Calculated using the CKD-EPI Creatinine Equation (2021)    Anion gap 9 5 - 15    Comment: Performed at [facility], [name] [place], [place] [address]  Lipase, blood     Status: None   Collection Time: 11/10/20  2:55 AM  Result Value Ref Range   Lipase 28 11 - 51 U/L    Comment: Performed at [facility], [address] [place], [place] [address]  Urine Culture     Status: Abnormal   Collection Time: 11/10/20  2:55 AM   Specimen: Urine, Random  Result Value Ref Range   Specimen Description      URINE, RANDOM Performed at [facility], [address] [place], [place] [address]    Special Requests      NONE Performed at [facility], [address], [place], [place] [address]    Culture (A)     <10,000 COLONIES/mL INSIGNIFICANT GROWTH Performed at [facility], [place] [address], [place], [place] [address]    Report Status 11/11/2020 FINAL   Resp Panel by RT-PCR (Flu A&B, Covid) Nasopharyngeal Swab     Status: None   Collection Time: 11/11/20 10:44 AM   Specimen: Nasopharyngeal Swab; Nasopharyngeal(NP) swabs in vial transport medium  Result Value Ref Range   SARS Coronavirus 2 by RT PCR NEGATIVE NEGATIVE     Comment: (NOTE) SARS-CoV-2 target nucleic acids are NOT DETECTED.  The SARS-CoV-2 RNA is generally detectable in upper respiratory specimens during the acute phase of infection. The lowest concentration of SARS-CoV-2 viral copies this assay can detect is 138 copies/mL. A negative result does not preclude SARS-Cov-2 infection and should not be used as the sole basis for treatment or other patient management decisions. A negative result may occur with  improper specimen collection/handling, submission of specimen other than nasopharyngeal swab, presence of viral mutation(s) within the areas targeted by this assay, and inadequate number of viral copies(<138 copies/mL). A negative result must be combined with clinical observations, patient history, and epidemiological information. The expected result is Negative.  Fact Sheet for Patients:  EntrepreneurPulse.com.au  Fact Sheet for Healthcare Providers:  IncredibleEmployment.be  This test is no t yet approved or cleared by the [place] FDA and  has been authorized for detection and/or diagnosis of SARS-CoV-2 by FDA under an Emergency Use Authorization (EUA). This EUA will remain  in effect (meaning this test can be used) for the duration of the COVID-19 declaration under Section 564(b)(1) of the Act, 21 U.S.C.section 360bbb-3(b)(1), unless the authorization is terminated  or revoked sooner.       Influenza A by PCR NEGATIVE NEGATIVE   Influenza B by PCR NEGATIVE NEGATIVE    Comment: (NOTE) The Xpert Xpress SARS-CoV-2/FLU/RSV plus assay is intended as an aid in the diagnosis of influenza from Nasopharyngeal swab specimens and should not be used as a sole basis for treatment. Nasal washings and aspirates are unacceptable for Xpert Xpress SARS-CoV-2/FLU/RSV testing.  Fact Sheet for Patients: EntrepreneurPulse.com.au  Fact Sheet for Healthcare  Providers: IncredibleEmployment.be  This test is not yet approved or cleared by the [place] and has been authorized for  detection and/or diagnosis of SARS-CoV-2 by FDA under an Emergency Use Authorization (EUA). This EUA will remain in effect (meaning this test can be used) for the duration of the COVID-19 declaration under Section 564(b)(1) of the Act, 21 U.S.C. section 360bbb-3(b)(1), unless the authorization is terminated or revoked.  Performed at [facility], [address] [place], [place] [address]     Blood Alcohol level:  Lab Results  Component Value Date   [facility] <10 11/08/2020   ETH <10 69/67/8938    Metabolic Disorder Labs:  No results found for: HGBA1C, MPG No results found for: PROLACTIN No results found for: CHOL, TRIG, HDL, CHOLHDL, VLDL, LDLCALC  Current Medications: Current Facility-Administered Medications  Medication Dose Route Frequency Provider Last Rate Last Admin  . acetaminophen (TYLENOL) tablet 650 mg  650 mg Oral Q6H PRN Clapacs, John T, MD      . alum & mag hydroxide-simeth (MAALOX/MYLANTA) 200-200-20 MG/5ML suspension 30 mL  30 mL Oral Q4H PRN Clapacs, John T, MD      . ARIPiprazole (ABILIFY) tablet 20 mg  20 mg Oral Daily Clapacs, John T, MD      . busPIRone (BUSPAR) tablet 30 mg  30 mg Oral BID Clapacs, John T, MD      . chlorhexidine (PERIDEX) 0.12 % solution 15 mL  15 mL Mouth/Throat BID Clapacs, John T, MD      . diphenhydrAMINE (BENADRYL) 2 % cream 1 application  1 application Topical BID PRN Clapacs, John T, MD      . docusate sodium (COLACE) capsule 100 mg  100 mg Oral BID Clapacs, John T, MD      . dolutegravir (TIVICAY) tablet 50 mg  50 mg Oral Daily Clapacs, Madie Reno, MD      . Derrill Memo ON 11/12/2020] emtricitabine-rilpivir-tenofovir AF (ODEFSEY) 200-25-25 MG per tablet 1 tablet  1 tablet Oral Q breakfast Clapacs, John T, MD      . hydrOXYzine (VISTARIL) capsule 100 mg  100 mg Oral QHS Clapacs, John T, MD       . ibuprofen (ADVIL) tablet 600 mg  600 mg Oral BID Clapacs, John T, MD      . magnesium hydroxide (MILK OF MAGNESIA) suspension 30 mL  30 mL Oral Daily PRN Clapacs, John T, MD      . medroxyPROGESTERone (DEPO-PROVERA) injection 150 mg  150 mg Intramuscular Q90 days Clapacs, John T, MD      . melatonin tablet 6 mg  6 mg Oral QHS Clapacs, John T, MD      . sertraline (ZOLOFT) tablet 150 mg  150 mg Oral Daily Clapacs, Madie Reno, MD       PTA Medications: Medications Prior to Admission  Medication Sig Dispense Refill Last Dose  . ARIPiprazole (ABILIFY) 20 MG tablet Take 20 mg by mouth daily.     . busPIRone (BUSPAR) 30 MG tablet Take 30 mg by mouth 2 (two) times daily.     . chlorhexidine (PERIDEX) 0.12 % solution Use as directed 15 mLs in the mouth or throat 2 (two) times daily.     . diphenhydrAMINE (BENADRYL) 2 % cream Apply 1 application topically 2 (two) times daily as needed for itching. (apply to feet)     . docusate sodium (COLACE) 100 MG capsule Take 100 mg by mouth 2 (two) times daily.     . dolutegravir (TIVICAY) 50 MG tablet Take 50 mg by mouth daily.     Marland Kitchen emtricitabine-rilpivir-tenofovir DF (COMPLERA) 200-25-300 MG tablet Take  1 tablet by mouth daily.     . hydrOXYzine (VISTARIL) 25 MG capsule Take 100 mg by mouth at bedtime.     Marland Kitchen ibuprofen (ADVIL) 600 MG tablet Take 600 mg by mouth 2 (two) times daily.     . medroxyPROGESTERone (DEPO-PROVERA) 150 MG/ML injection Inject 150 mg into the muscle every 3 (three) months.     . melatonin 3 MG TABS tablet Take 6 mg by mouth at bedtime.     . sertraline (ZOLOFT) 100 MG tablet Take 150 mg by mouth daily.       Musculoskeletal: Strength & Muscle Tone: within normal limits Gait & Station: normal Patient leans: N/A  Psychiatric Specialty Exam: Physical Exam Vitals and nursing note reviewed.  Constitutional:      Appearance: Normal appearance.  HENT:     Head: Normocephalic and atraumatic.     Right Ear: External ear normal.      Left Ear: External ear normal.     Nose: Nose normal.     Mouth/Throat:     Mouth: Mucous membranes are moist.     Pharynx: Oropharynx is clear.  Eyes:     Extraocular Movements: Extraocular movements intact.     Conjunctiva/sclera: Conjunctivae normal.     Pupils: Pupils are equal, round, and reactive to light.  Cardiovascular:     Rate and Rhythm: Normal rate.     Pulses: Normal pulses.  Pulmonary:     Effort: Pulmonary effort is normal.     Breath sounds: Normal breath sounds.  Abdominal:     General: Abdomen is flat.     Palpations: Abdomen is soft.  Musculoskeletal:        General: No swelling. Normal range of motion.     Cervical back: Normal range of motion and neck supple.  Skin:    General: Skin is warm and dry.  Neurological:     General: No focal deficit present.     Mental Status: She is alert and oriented to person, place, and time.  Psychiatric:        Attention and Perception: Attention and perception normal.        Mood and Affect: Mood is depressed. Affect is blunt.        Speech: Speech normal.        Behavior: Behavior is cooperative.        Thought Content: Thought content includes suicidal ideation. Thought content does not include homicidal ideation. Thought content includes suicidal plan.        Cognition and Memory: Cognition normal. Memory is impaired.        Judgment: Judgment is impulsive.     Review of Systems  Constitutional: Negative for appetite change and fatigue.  HENT: Negative for rhinorrhea and sore throat.   Eyes: Negative for photophobia and visual disturbance.  Respiratory: Negative for cough and shortness of breath.   Cardiovascular: Negative for chest pain and palpitations.  Gastrointestinal: Negative for constipation, diarrhea, nausea and vomiting.  Endocrine: Negative for cold intolerance and heat intolerance.  Genitourinary: Negative for difficulty urinating and dysuria.  Musculoskeletal: Negative for arthralgias and myalgias.   Skin: Negative for rash and wound.  Allergic/Immunologic: Negative for environmental allergies and food allergies.  Neurological: Negative for dizziness and headaches.  Hematological: Negative for adenopathy. Does not bruise/bleed easily.  Psychiatric/Behavioral: Positive for dysphoric mood, self-injury and suicidal ideas.    Blood pressure 104/67, pulse 75, temperature 98.2 F (36.8 C), temperature source Oral, resp. rate 18, height 5'  3.78" (1.62 m), weight 81.2 kg, last menstrual period 10/22/2020, SpO2 100 %.Body mass index is 30.94 kg/m.  General Appearance: Casual  Eye Contact:  Good  Speech:  Clear and Coherent  Volume:  Normal  Mood:  Anxious and Depressed  Affect:  Congruent  Thought Process:  Disorganized  Orientation:  Full (Time, Place, and Person)  Thought Content:  Rumination  Suicidal Thoughts:  Yes.  with intent/plan  Homicidal Thoughts:  No  Memory:  Immediate;   Fair Recent;   Poor Remote;   Poor  Judgement:  Impaired  Insight:  Shallow  Psychomotor Activity:  Normal  Concentration:  Concentration: Fair  Recall:  [place] of Knowledge:  Negative  Language:  Fair  Akathisia:  Negative  Handed:  Right  AIMS (if indicated):     Assets:  Desire for Improvement Financial Resources/Insurance Housing Resilience  ADL's:  Impaired  Cognition:  Impaired,  Mild  Sleep:       Treatment Plan Summary: Daily contact with patient to assess and evaluate symptoms and progress in treatment and Medication management   1) Adjustment Disorder with mixed disturbance of emotions and conduct- established problem, unstable - Restart Abilify 20 mg, Buspar 30 mg BID, and Zoloft 150 mg daily  - Check hemoglobin A1c, lipid panel  2) HIV- established problem, stable - Continue Tivicay and Odefsey  Observation Level/Precautions:  15 minute checks  Laboratory:  HbAIC lipid panel  Psychotherapy:    Medications:    Consultations:    Discharge Concerns:    Estimated LOS:   Other:     Physician Treatment Plan for Primary Diagnosis: Adjustment disorder with mixed disturbance of emotions and conduct Long Term Goal(s): Improvement in symptoms so as ready for discharge  Short Term Goals: Ability to identify changes in lifestyle to reduce recurrence of condition will improve, Ability to verbalize feelings will improve, Ability to disclose and discuss suicidal ideas, Ability to demonstrate self-control will improve, Ability to identify and develop effective coping behaviors will improve and Compliance with prescribed medications will improve  Physician Treatment Plan for Secondary Diagnosis: Principal Problem:   Adjustment disorder with mixed disturbance of emotions and conduct Active Problems:   HIV (human immunodeficiency virus infection) ([place])  Long Term Goal(s): Improvement in symptoms so as ready for discharge  Short Term Goals: Ability to identify changes in lifestyle to reduce recurrence of condition will improve, Ability to verbalize feelings will improve, Ability to disclose and discuss suicidal ideas, Ability to demonstrate self-control will improve, Ability to identify and develop effective coping behaviors will improve and Compliance with prescribed medications will improve  I certify that inpatient services furnished can reasonably be expected to improve the patient's condition.    Salley Scarlet, MD 2/21/20224:41 PM

## 2020-11-11 NOTE — BH Assessment (Addendum)
TTS made contact with pt's guardian Crystal [name] [phone]) for an updated on pt's current [place] status and to provide an update on pt's current disposition. Crystal [name] expressed concerns with pt's multiple visits to the ED (10 x's) and admission's for INPT (3 x's) in the last 6 months. Crystal [name] reports pt's behaviors to be escalating and expressed feeling something more than IDD is going on with the pt. Crystal [name] identified pt's main concerning behaviors to be pt's increased SI/HI and elopement endorsed usually to get her way or for new placement. Crystal [name] identified pt's current group home to be ([facility]) since last week and confirmed their willingness to allow pt to return after stabilization. Crystal [name] also, reports to be exploring higher level of care options for pt and is receptive to pt's current disposition. TTS also, provided Crystal [name] with medical records number per her request.  TTS updated Dr. Weber Cooks and BMU staff. Pt remains under review with BMU at this time.   TTS also updated Crystal Saxon, RN of the need for an updated COVID test for pt's current review.

## 2020-11-11 NOTE — BH Assessment (Signed)
Patient can come down at 3:30pm   Call to give report: [phone]  Patient is to be admitted to [facility] by Dr. Domingo Cocking.  Attending Physician will be. Dr. Domingo Cocking.   Patient has been assigned to room 306, by [place] Nurse Trinda Pascal, [place].   Intake Paper Work has been signed and placed on patient chart.  ER staff is aware of the admission: 1. Lattie Haw, ER Secretary  2. Paduchowski, ER MD  3. Gwyndolyn Saxon, Patient's Nurse  4. THO, Patient Access.

## 2020-11-11 NOTE — Tx Team (Signed)
Initial Treatment Plan 11/11/2020 5:28 PM Crystal [name] [id number]   PATIENT STRESSORS: Living arrangements after discharge Conflict with legal guardian   PATIENT STRENGTHS: Motivation for treatment/growth   PATIENT IDENTIFIED PROBLEMS: Self-harm due to frustration about group home                     DISCHARGE CRITERIA:  Adequate post-discharge living arrangements Improved stabilization in mood, thinking, and/or behavior  PRELIMINARY DISCHARGE PLAN: Return to previous living arrangement  PATIENT/FAMILY INVOLVEMENT: This treatment plan has been presented to and reviewed with the patient, Crystal [name]. The patient has been given the opportunity to ask questions and make suggestions.  Sunday Spillers, RN 11/11/2020, 5:28 PM

## 2020-11-11 NOTE — ED Provider Notes (Signed)
-----------------------------------------   2:39 PM on 11/11/2020 -----------------------------------------  Patient has been seen and evaluated by psychiatry.  They will be admitting to the psychiatric unit for further treatment.  Patient's medical work-up is largely nonrevealing.  CT scan is negative.  Covid negative.   Harvest Dark, MD 11/11/20 1440

## 2020-11-11 NOTE — BHH Suicide Risk Assessment (Signed)
[facility] Admission Suicide Risk Assessment   Nursing information obtained from:    Demographic factors:    Current Mental Status:    Loss Factors:    Historical Factors:    Risk Reduction Factors:     Total Time spent with patient: 1 hour Principal Problem: Adjustment disorder with mixed disturbance of emotions and conduct Diagnosis:  Principal Problem:   Adjustment disorder with mixed disturbance of emotions and conduct Active Problems:   HIV (human immunodeficiency virus infection) (HCC)  Subjective Data: Patient is a 30 year old female with 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 Wounds are superficial without need for suturing or medical attention. She continues to state she feels unsafe returning to group home because she feels the manager has threatened her, and she feels that another staff member has been talking about her on the phone. She states she will continue to harm herself if she returns to the group home. She denies homicidal ideations, visual hallucinations, and auditory hallucinations. She is a limited historian, and can only tell me she has gone to the hospital three times recently both at [facility] and [facility]. She denies feeling that medications have been helpful. She notes the only thing that is helpful is being inpatient. She states her goals are to get a new DSS guardian for this county, and to go to a different group home. While she continues to state she is feeling suicidal she denies any symptoms of depression such as change in sleep, change in appetite, change in concentration, change in energy, anhedonia, hopelessness, helplessness, or worthlessness. Aside from impulsivity, she also denies symptoms of mania or anxiety.   Continued Clinical Symptoms:    The "Alcohol Use Disorders Identification Test", Guidelines for Use in Primary Care, Second  Edition.  World Pharmacologist [facility]). Score between 0-7:  no or low risk or alcohol related problems. Score between 8-15:  moderate risk of alcohol related problems. Score between 16-19:  high risk of alcohol related problems. Score 20 or above:  warrants further diagnostic evaluation for alcohol dependence and treatment.   CLINICAL FACTORS:   Severe Anxiety and/or Agitation Unstable or Poor Therapeutic Relationship Previous Psychiatric Diagnoses and Treatments   Musculoskeletal: Strength & Muscle Tone: within normal limits Gait & Station: normal Patient leans: N/A  Psychiatric Specialty Exam: Physical Exam Vitals and nursing note reviewed.  Constitutional:      Appearance: Normal appearance.  HENT:     Head: Normocephalic and atraumatic.     Right Ear: External ear normal.     Left Ear: External ear normal.     Nose: Nose normal.     Mouth/Throat:     Mouth: Mucous membranes are moist.     Pharynx: Oropharynx is clear.  Eyes:     Extraocular Movements: Extraocular movements intact.     Conjunctiva/sclera: Conjunctivae normal.     Pupils: Pupils are equal, round, and reactive to light.  Cardiovascular:     Rate and Rhythm: Normal rate.     Pulses: Normal pulses.  Pulmonary:     Effort: Pulmonary effort is normal.     Breath sounds: Normal breath sounds.  Abdominal:     General: Abdomen is flat.     Palpations: Abdomen is soft.  Musculoskeletal:        General: No swelling. Normal range of motion.     Cervical back: Normal range of motion and neck supple.  Skin:  General: Skin is warm and dry.  Neurological:     General: No focal deficit present.     Mental Status: She is alert and oriented to person, place, and time.  Psychiatric:        Attention and Perception: Attention and perception normal.        Mood and Affect: Mood is depressed. Affect is blunt.        Speech: Speech normal.        Behavior: Behavior is cooperative.        Thought Content:  Thought content includes suicidal ideation. Thought content does not include homicidal ideation. Thought content includes suicidal plan.        Cognition and Memory: Cognition normal. Memory is impaired.        Judgment: Judgment is impulsive.     Review of Systems  Constitutional: Negative for appetite change and fatigue.  HENT: Negative for rhinorrhea and sore throat.   Eyes: Negative for photophobia and visual disturbance.  Respiratory: Negative for cough and shortness of breath.   Cardiovascular: Negative for chest pain and palpitations.  Gastrointestinal: Negative for constipation, diarrhea, nausea and vomiting.  Endocrine: Negative for cold intolerance and heat intolerance.  Genitourinary: Negative for difficulty urinating and dysuria.  Musculoskeletal: Negative for arthralgias and myalgias.  Skin: Negative for rash and wound.  Allergic/Immunologic: Negative for environmental allergies and food allergies.  Neurological: Negative for dizziness and headaches.  Hematological: Negative for adenopathy. Does not bruise/bleed easily.  Psychiatric/Behavioral: Positive for dysphoric mood, self-injury and suicidal ideas.    Blood pressure 104/67, pulse 75, temperature 98.2 F (36.8 C), temperature source Oral, resp. rate 18, height 5' 3.78" (1.62 m), weight 81.2 kg, last menstrual period 10/22/2020, SpO2 100 %.Body mass index is 30.94 kg/m.  General Appearance: Casual  Eye Contact:  Good  Speech:  Clear and Coherent  Volume:  Normal  Mood:  Anxious and Depressed  Affect:  Congruent  Thought Process:  Disorganized  Orientation:  Full (Time, Place, and Person)  Thought Content:  Rumination  Suicidal Thoughts:  Yes.  with intent/plan  Homicidal Thoughts:  No  Memory:  Immediate;   Fair Recent;   Poor Remote;   Poor  Judgement:  Impaired  Insight:  Shallow  Psychomotor Activity:  Normal  Concentration:  Concentration: Fair  Recall:  [place] of Knowledge:  Negative  Language:   Fair  Akathisia:  Negative  Handed:  Right  AIMS (if indicated):     Assets:  Desire for Improvement Financial Resources/Insurance Housing Resilience  ADL's:  Impaired  Cognition:  Impaired,  Mild  Sleep:            COGNITIVE FEATURES THAT CONTRIBUTE TO RISK:  Closed-mindedness, Loss of executive function and Polarized thinking    SUICIDE RISK:   Moderate:  Frequent suicidal ideation with limited intensity, and duration, some specificity in terms of plans, no associated intent, good self-control, limited dysphoria/symptomatology, some risk factors present, and identifiable protective factors, including available and accessible social support.  PLAN OF CARE: Continue admission. See H&P for full details.   I certify that inpatient services furnished can reasonably be expected to improve the patient's condition.   Salley Scarlet, MD 11/11/2020, 5:00 PM

## 2020-11-11 NOTE — BH Assessment (Signed)
Patient to be reviewed with [facility] BMU today 11/11/20

## 2020-11-11 NOTE — Progress Notes (Signed)
Patient calm and cooperative during assessment denying SI/HI/AVH with this Probation officer. Patient presents with a flat affect. Pt observed interacting appropriately with staff and peers on the unit. Patient compliant with medication administration per MD orders. Pt compliant with medication administration per MD orders. Pt has superficial cuts to her wrists. Pt being monitored Q 15 minutes for safety per unit protocol. Pt remains safe on the unit.

## 2020-11-11 NOTE — ED Notes (Signed)
Unable to obtain vitals due to patient sleeping. Will continue to monitor.   

## 2020-11-11 NOTE — ED Notes (Signed)
Per pts guardian, Crystal [name], pt has been making calls and giving out her social security number. Ms. Mel [name] requests that pt not be allowed to use the phone at this time.

## 2020-11-11 NOTE — ED Notes (Signed)
No VS at this time due to pt sleeping. Will obtain when pt wakes up.

## 2020-11-11 NOTE — BH Assessment (Signed)
Pt currently under review with [facility]

## 2020-11-12 LAB — LIPID PANEL
Cholesterol: 169 mg/dL (ref 0–200)
HDL: 32 mg/dL — ABNORMAL LOW (ref >40)
LDL Cholesterol: 100 mg/dL — ABNORMAL HIGH (ref 0–99)
Total CHOL/HDL Ratio: 5.3 RATIO
Triglycerides: 186 mg/dL — ABNORMAL HIGH (ref <150)
VLDL: 37 mg/dL (ref 0–40)

## 2020-11-12 LAB — HEMOGLOBIN A1C
Hgb A1c MFr Bld: 5.2 % (ref 4.8–5.6)
Mean Plasma Glucose: 102.54 mg/dL

## 2020-11-12 NOTE — Plan of Care (Signed)
Patient is visible in the milieu, frequently coming to the nurses station to report that she will not go back to her current group home, that she is not treated right at this group home. Patient is focused on this issue and has been requesting to talk to many staff members about it. She is otherwise cooperative, eating well and taking medications.

## 2020-11-12 NOTE — Progress Notes (Signed)
Patient calm and cooperative during assessment denying SI/HI/AVH with this Probation officer. Patient presents with a flat affect. Pt observed interacting appropriately with staff and peers on the unit. Patient compliant with medication administration per MD orders. Pt compliant with medication administration per MD orders. Pt has superficial cuts to her wrists. Pt being monitored Q 15 minutes for safety per unit protocol. Pt remains safe on the unit.

## 2020-11-12 NOTE — Progress Notes (Signed)
[facility] MD Progress Note  11/12/2020 1:19 PM Crystal [name]  MRN:  [id number]  CC "I need to talk to the person in charge of the unit."  Subjective:  Patient is a 30 year old female with 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 Patient had no acute overnight events, attending to ADLs, medication compliant.   Patient see one-on-one this morning. She continues to state she wants a new guardian and a new group home. Discussed that we would need to communicate with her current guardian about disposition, and that changing DSS guardian would occur through the court system. She then requests that we call her guardian together.   Contacted Crystal [phone]. She notes that Crystal [name] has only been in her group home for 1 week, and has not given this an adequate trial. She notes this is her third placement in 7 months. She is currently working with her supervisor and 2 MCO's from the state level to assist with this case. They have already contacted every county for group homes and family care homes, and this is the only group home willing to accept her due to her past behaviors of running away, cutting herself, and threatening to harm others. She also does not qualify to go to an ALF, and cannot go to a homeless shelter. At this time, her only option is to return to group home until alternate placement can be found. Crystal [name] also notes that Crystal [name] has already been in contact with both her and her supervisor, and at this time her guardian cannot be changed to Crystal [name] as she is not an established resident as she arrived in the last 5 days. Both myself and guardian tried to explain above situation in a multitude of ways to patient. However, she continues to state she would rather be on the streets than return to group home. Will continue to work with improving her insight.      Principal Problem: Adjustment disorder with mixed disturbance of emotions and conduct Diagnosis: Principal Problem:   Adjustment disorder with mixed disturbance of emotions and conduct Active Problems:   HIV (human immunodeficiency virus infection) ([name])  Total Time spent with patient: 45 minutes  Past Psychiatric History: See H&P  Past Medical History:  Past Medical History:  Diagnosis Date  . Cancer ([place])    skin  . Depression   . HIV (human immunodeficiency virus infection) ([place])     Past Surgical History:  Procedure Laterality Date  . SKIN SURGERY     Family History: History reviewed. No pertinent family history. Family Psychiatric  History: See H&P Social History:  Social History   Substance and Sexual Activity  Alcohol Use Not Currently     Social History   Substance and Sexual Activity  Drug Use Not Currently    Social History   Socioeconomic History  . Marital status: Single    Spouse name: Not on file  . Number of children: Not on file  . Years of education: Not on file  . Highest education level: Not on file  Occupational History  . Not on file  Tobacco Use  . Smoking status: Current Every Day Smoker    Packs/day: 0.50    Years: 12.00    Pack years: 6.00  . Smokeless tobacco: Never Used  Vaping Use  . Vaping Use: Never used  Substance and Sexual Activity  . Alcohol use: Not Currently  . Drug use:  Not Currently  . Sexual activity: Not on file  Other Topics Concern  . Not on file  Social History Narrative  . Not on file   Social Determinants of Health   Financial Resource Strain: Not on file  Food Insecurity: Not on file  Transportation Needs: Not on file  Physical Activity: Not on file  Stress: Not on file  Social Connections: Not on file   Additional Social History:                         Sleep: Fair  Appetite:  Fair  Current Medications: Current Facility-Administered Medications  Medication Dose Route  Frequency Provider Last Rate Last Admin  . acetaminophen (TYLENOL) tablet 650 mg  650 mg Oral Q6H PRN Clapacs, John T, MD      . alum & mag hydroxide-simeth (MAALOX/MYLANTA) 200-200-20 MG/5ML suspension 30 mL  30 mL Oral Q4H PRN Clapacs, Madie Reno, MD   30 mL at 11/12/20 0647  . ARIPiprazole (ABILIFY) tablet 20 mg  20 mg Oral Daily Clapacs, Madie Reno, MD   20 mg at 11/12/20 0819  . busPIRone (BUSPAR) tablet 30 mg  30 mg Oral BID Clapacs, Madie Reno, MD   30 mg at 11/12/20 1114  . chlorhexidine (PERIDEX) 0.12 % solution 15 mL  15 mL Mouth/Throat BID Clapacs, John T, MD   15 mL at 11/11/20 1821  . diphenhydrAMINE (BENADRYL) 2 % cream 1 application  1 application Topical BID PRN Clapacs, John T, MD      . docusate sodium (COLACE) capsule 100 mg  100 mg Oral BID Clapacs, Madie Reno, MD   100 mg at 11/12/20 0819  . dolutegravir (TIVICAY) tablet 50 mg  50 mg Oral Daily Clapacs, Madie Reno, MD   50 mg at 11/12/20 0820  . emtricitabine-rilpivir-tenofovir AF (ODEFSEY) 200-25-25 MG per tablet 1 tablet  1 tablet Oral Q breakfast Clapacs, Madie Reno, MD   1 tablet at 11/12/20 0820  . hydrOXYzine (ATARAX/VISTARIL) tablet 100 mg  100 mg Oral QHS Clapacs, Madie Reno, MD   100 mg at 11/11/20 2118  . ibuprofen (ADVIL) tablet 600 mg  600 mg Oral BID Clapacs, Madie Reno, MD   600 mg at 11/12/20 0819  . magnesium hydroxide (MILK OF MAGNESIA) suspension 30 mL  30 mL Oral Daily PRN Clapacs, John T, MD      . medroxyPROGESTERone (DEPO-PROVERA) injection 150 mg  150 mg Intramuscular Q90 days Clapacs, Madie Reno, MD   150 mg at 11/11/20 1809  . melatonin tablet 6 mg  6 mg Oral QHS Clapacs, Madie Reno, MD   6 mg at 11/11/20 2118  . sertraline (ZOLOFT) tablet 150 mg  150 mg Oral Daily Clapacs, Madie Reno, MD   150 mg at 11/12/20 3382    Lab Results:  Results for orders placed or performed during the hospital encounter of 11/11/20 (from the past 48 hour(s))  Lipid panel     Status: Abnormal   Collection Time: 11/12/20  6:39 AM  Result Value Ref Range    Cholesterol 169 0 - 200 mg/dL   Triglycerides 186 (H) <150 mg/dL   HDL 32 (L) >40 mg/dL   Total CHOL/HDL Ratio 5.3 RATIO   VLDL 37 0 - 40 mg/dL   LDL Cholesterol 100 (H) 0 - 99 mg/dL    Comment:        Total Cholesterol/HDL:CHD Risk Coronary Heart Disease Risk Table  Men   Women  1/2 Average Risk   3.4   3.3  Average Risk       5.0   4.4  2 X Average Risk   9.6   7.1  3 X Average Risk  23.4   11.0        Use the calculated Patient Ratio above and the CHD Risk Table to determine the patient's CHD Risk.        ATP III CLASSIFICATION (LDL):  <100     mg/dL   Optimal  100-129  mg/dL   Near or Above                    Optimal  130-159  mg/dL   Borderline  160-189  mg/dL   High  >190     mg/dL   Very High Performed at [facility], [place], [place] [address]   Hemoglobin A1c     Status: None   Collection Time: 11/12/20  6:39 AM  Result Value Ref Range   Hgb A1c MFr Bld 5.2 4.8 - 5.6 %    Comment: (NOTE) Pre diabetes:          5.7%-6.4%  Diabetes:              >6.4%  Glycemic control for   <7.0% adults with diabetes    Mean Plasma Glucose 102.54 mg/dL    Comment: Performed at [facility] [address], [place], [place] [address]    Blood Alcohol level:  Lab Results  Component Value Date   ETH <10 11/08/2020   ETH <10 93/23/5573    Metabolic Disorder Labs: Lab Results  Component Value Date   HGBA1C 5.2 11/12/2020   MPG 102.54 11/12/2020   No results found for: PROLACTIN Lab Results  Component Value Date   CHOL 169 11/12/2020   TRIG 186 (H) 11/12/2020   HDL 32 (L) 11/12/2020   CHOLHDL 5.3 11/12/2020   VLDL 37 11/12/2020   LDLCALC 100 (H) 11/12/2020    Physical Findings: AIMS:  , ,  ,  ,    CIWA:    COWS:     Musculoskeletal: Strength & Muscle Tone: within normal limits Gait & Station: normal Patient leans: N/A  Psychiatric Specialty Exam: Physical Exam  Review of Systems  Blood pressure  112/84, pulse (!) 109, temperature 98 F (36.7 C), temperature source Oral, resp. rate 17, height 5' 3.78" (1.62 m), weight 81.2 kg, last menstrual period 10/22/2020, SpO2 97 %.Body mass index is 30.94 kg/m.  General Appearance: Casual  Eye Contact:  Fair  Speech:  Clear and Coherent  Volume:  Normal  Mood:  Euthymic  Affect:  Congruent  Thought Process:  Coherent  Orientation:  Full (Time, Place, and Person)  Thought Content:  Rumination  Suicidal Thoughts:  Yes.  with intent/plan  Homicidal Thoughts:  No  Memory:  Immediate;   Fair  Judgement:  Impaired  Insight:  Lacking  Psychomotor Activity:  Normal  Concentration:  Concentration: Fair  Recall:  [employer] of Knowledge:  Fair  Language:  Fair  Akathisia:  Negative  Handed:  Right  AIMS (if indicated):     Assets:  Desire for Improvement Financial Resources/Insurance Housing Physical Health Resilience Social Support  ADL's:  Intact  Cognition:  WNL  Sleep:  Number of Hours: 7.5     Treatment Plan Summary: Daily contact with patient to assess and evaluate symptoms and progress  in treatment and Medication management  1) Adjustment Disorder with mixed disturbance of emotions and conduct- established problem, unstable - Continue Abilify 20 mg, Buspar 30 mg BID, and Zoloft 150 mg daily  - Lipid panel WNL aside from mildly elevated tiglycerides of 186. Hemoglobin A1c 5.2. Will counsel dietary modifications and exercise  2) HIV- established problem, stable - Continue [place] and Frederich Chick, MD 11/12/2020, 1:19 PM

## 2020-11-12 NOTE — Progress Notes (Signed)
Recreation Therapy Notes  Date: 11/12/2020  Time: 9:30 am   Location: Craft room   Behavioral response: Appropriate  Intervention Topic: Problem Solving    Discussion/Intervention:  Group content today was focused on time management. The group defined time management and identified healthy ways to manage time. Individuals expressed how much of the 24 hours they use in a day. Patients expressed how much time they use just for themselves personally. The group expressed how they have managed their time in the past. Individuals participated in the intervention "Managing Life" where they had a chance to see how much of the 24 hours they use and where it goes. Clinical Observations/Feedback: Patient came to group and defined problem solving as trying to get away from the situation. She explained that her anxiety normally stops her from problem solving. Individual was social with staff while participating in the intervention. Participant left group early and did not return.  Shaverence Outlaw LRT/CTRS          Shaverence  Outlaw 11/12/2020 11:55 AM

## 2020-11-12 NOTE — Plan of Care (Signed)
Patient irritable stating she doesn't want to go back to the group home she came from   Problem: Education: Goal: Emotional status will improve Outcome: Not Progressing Goal: Mental status will improve Outcome: Not Progressing

## 2020-11-12 NOTE — BHH Group Notes (Signed)
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
## 2020-11-13 NOTE — Plan of Care (Signed)
Patient endorses anxiety and depression stating she doesn't want to be here any more and wants to go to another facility   Problem: Education: Goal: Emotional status will improve Outcome: Not Progressing Goal: Mental status will improve Outcome: Not Progressing

## 2020-11-13 NOTE — Plan of Care (Signed)
  Problem: Education: Goal: Knowledge of [facility] Education information/materials will improve Outcome: Progressing Goal: Emotional status will improve Outcome: Not Progressing Goal: Mental status will improve Outcome: Not Progressing Goal: Verbalization of understanding the information provided will improve Outcome: Progressing   Problem: Activity: Goal: Interest or engagement in activities will improve Outcome: Progressing Goal: Sleeping patterns will improve Outcome: Progressing   Problem: Coping: Goal: Ability to verbalize frustrations and anger appropriately will improve Outcome: Not Progressing Goal: Ability to demonstrate self-control will improve Outcome: Not Progressing   Problem: Health Behavior/Discharge Planning: Goal: Identification of resources available to assist in meeting health care needs will improve Outcome: Progressing Goal: Compliance with treatment plan for underlying cause of condition will improve Outcome: Progressing   Problem: Physical Regulation: Goal: Ability to maintain clinical measurements within normal limits will improve Outcome: Progressing   Problem: Safety: Goal: Periods of time without injury will increase Outcome: Progressing   Problem: Education: Goal: Utilization of techniques to improve thought processes will improve Outcome: Progressing Goal: Knowledge of the prescribed therapeutic regimen will improve Outcome: Progressing   Problem: Activity: Goal: Interest or engagement in leisure activities will improve Outcome: Progressing Goal: Imbalance in normal sleep/wake cycle will improve Outcome: Progressing   Problem: Coping: Goal: Coping ability will improve Outcome: Progressing Goal: Will verbalize feelings Outcome: Progressing  Pt rates depression, anxiety, hopelessness all at 10/10. Pt denies SI, Hi and AVH. Pt was educated on care plan and verbalizes understanding. Collier Bullock RN Problem: Health  Behavior/Discharge Planning: Goal: Ability to make decisions will improve Outcome: Progressing Goal: Compliance with therapeutic regimen will improve Outcome: Progressing   Problem: Health Behavior/Discharge Planning: Goal: Ability to make decisions will improve Outcome: Progressing Goal: Compliance with therapeutic regimen will improve Outcome: Progressing   Problem: Role Relationship: Goal: Will demonstrate positive changes in social behaviors and relationships Outcome: Progressing   Problem: Safety: Goal: Ability to disclose and discuss suicidal ideas will improve Outcome: Progressing Goal: Ability to identify and utilize support systems that promote safety will improve Outcome: Progressing

## 2020-11-13 NOTE — Progress Notes (Signed)
Recreation Therapy Notes  INPATIENT RECREATION TR PLAN  Patient Details Name: Crystal [name] MRN: [id number] DOB: 05-Oct-1990 Today's Date: 11/13/2020  Rec Therapy Plan Is patient appropriate for Therapeutic Recreation?: Yes Treatment times per week: at least 3 Estimated Length of Stay: 5-7 days TR Treatment/Interventions: Group participation (Comment)  Discharge Criteria Pt will be discharged from therapy if:: Discharged Treatment plan/goals/alternatives discussed and agreed upon by:: Patient/family  Discharge Summary     Javonn Gauger 11/13/2020, 1:55 PM

## 2020-11-13 NOTE — Tx Team (Signed)
Interdisciplinary Treatment and Diagnostic Plan Update  11/13/2020 Time of Session: 9:00AM Crystal [name] MRN: [id number]  Principal Diagnosis: Adjustment disorder with mixed disturbance of emotions and conduct  Secondary Diagnoses: Principal Problem:   Adjustment disorder with mixed disturbance of emotions and conduct Active Problems:   HIV (human immunodeficiency virus infection) ([place])   Current Medications:  Current Facility-Administered Medications  Medication Dose Route Frequency Provider Last Rate Last Admin  . acetaminophen (TYLENOL) tablet 650 mg  650 mg Oral Q6H PRN Clapacs, John T, MD      . alum & mag hydroxide-simeth (MAALOX/MYLANTA) 200-200-20 MG/5ML suspension 30 mL  30 mL Oral Q4H PRN Clapacs, Madie [place], MD   30 mL at 11/12/20 2157  . ARIPiprazole (ABILIFY) tablet 20 mg  20 mg Oral Daily Clapacs, Madie Reno, MD   20 mg at 11/13/20 0748  . busPIRone (BUSPAR) tablet 30 mg  30 mg Oral BID Clapacs, Madie Reno, MD   30 mg at 11/13/20 0745  . chlorhexidine (PERIDEX) 0.12 % solution 15 mL  15 mL Mouth/Throat BID Clapacs, John T, MD   15 mL at 11/11/20 1821  . diphenhydrAMINE (BENADRYL) 2 % cream 1 application  1 application Topical BID PRN Clapacs, John T, MD      . docusate sodium (COLACE) capsule 100 mg  100 mg Oral BID Clapacs, Madie Reno, MD   100 mg at 11/13/20 0747  . dolutegravir (TIVICAY) tablet 50 mg  50 mg Oral Daily Clapacs, Madie Reno, MD   50 mg at 11/13/20 0749  . emtricitabine-rilpivir-tenofovir AF (ODEFSEY) 200-25-25 MG per tablet 1 tablet  1 tablet Oral Q breakfast Clapacs, Madie Reno, MD   1 tablet at 11/13/20 0749  . hydrOXYzine (ATARAX/VISTARIL) tablet 100 mg  100 mg Oral QHS Clapacs, Madie Reno, MD   100 mg at 11/12/20 2112  . ibuprofen (ADVIL) tablet 600 mg  600 mg Oral BID Clapacs, Madie Reno, MD   600 mg at 11/13/20 0744  . magnesium hydroxide (MILK OF MAGNESIA) suspension 30 mL  30 mL Oral Daily PRN Clapacs, John T, MD      . medroxyPROGESTERone (DEPO-PROVERA) injection 150 mg  150 mg  Intramuscular Q90 days Clapacs, Madie Reno, MD   150 mg at 11/11/20 1809  . melatonin tablet 6 mg  6 mg Oral QHS Clapacs, Madie Reno, MD   6 mg at 11/12/20 2112  . sertraline (ZOLOFT) tablet 150 mg  150 mg Oral Daily Clapacs, Madie Reno, MD   150 mg at 11/13/20 6734   PTA Medications: Medications Prior to Admission  Medication Sig Dispense Refill Last Dose  . ARIPiprazole (ABILIFY) 20 MG tablet Take 20 mg by mouth daily.     . busPIRone (BUSPAR) 30 MG tablet Take 30 mg by mouth 2 (two) times daily.     . chlorhexidine (PERIDEX) 0.12 % solution Use as directed 15 mLs in the mouth or throat 2 (two) times daily.     . diphenhydrAMINE (BENADRYL) 2 % cream Apply 1 application topically 2 (two) times daily as needed for itching. (apply to feet)     . docusate sodium (COLACE) 100 MG capsule Take 100 mg by mouth 2 (two) times daily.     . dolutegravir (TIVICAY) 50 MG tablet Take 50 mg by mouth daily.     Marland Kitchen emtricitabine-rilpivir-tenofovir DF (COMPLERA) 200-25-300 MG tablet Take 1 tablet by mouth daily.     . hydrOXYzine (VISTARIL) 25 MG capsule Take 100 mg by mouth at bedtime.     Marland Kitchen  ibuprofen (ADVIL) 600 MG tablet Take 600 mg by mouth 2 (two) times daily.     . medroxyPROGESTERone (DEPO-PROVERA) 150 MG/ML injection Inject 150 mg into the muscle every 3 (three) months.     . melatonin 3 MG TABS tablet Take 6 mg by mouth at bedtime.     . sertraline (ZOLOFT) 100 MG tablet Take 150 mg by mouth daily.       Patient Stressors:    Patient Strengths: Motivation for treatment/growth  Treatment Modalities: Medication Management, Group therapy, Case management,  1 to 1 session with clinician, Psychoeducation, Recreational therapy.   Physician Treatment Plan for Primary Diagnosis: Adjustment disorder with mixed disturbance of emotions and conduct Long Term Goal(s): Improvement in symptoms so as ready for discharge Improvement in symptoms so as ready for discharge   Short Term Goals: Ability to identify changes in  lifestyle to reduce recurrence of condition will improve Ability to verbalize feelings will improve Ability to disclose and discuss suicidal ideas Ability to demonstrate self-control will improve Ability to identify and develop effective coping behaviors will improve Compliance with prescribed medications will improve Ability to identify changes in lifestyle to reduce recurrence of condition will improve Ability to verbalize feelings will improve Ability to disclose and discuss suicidal ideas Ability to demonstrate self-control will improve Ability to identify and develop effective coping behaviors will improve Compliance with prescribed medications will improve  Medication Management: Evaluate patient's response, side effects, and tolerance of medication regimen.  Therapeutic Interventions: 1 to 1 sessions, Unit Group sessions and Medication administration.  Evaluation of Outcomes: Not Met  Physician Treatment Plan for Secondary Diagnosis: Principal Problem:   Adjustment disorder with mixed disturbance of emotions and conduct Active Problems:   HIV (human immunodeficiency virus infection) ([place])  Long Term Goal(s): Improvement in symptoms so as ready for discharge Improvement in symptoms so as ready for discharge   Short Term Goals: Ability to identify changes in lifestyle to reduce recurrence of condition will improve Ability to verbalize feelings will improve Ability to disclose and discuss suicidal ideas Ability to demonstrate self-control will improve Ability to identify and develop effective coping behaviors will improve Compliance with prescribed medications will improve Ability to identify changes in lifestyle to reduce recurrence of condition will improve Ability to verbalize feelings will improve Ability to disclose and discuss suicidal ideas Ability to demonstrate self-control will improve Ability to identify and develop effective coping behaviors will  improve Compliance with prescribed medications will improve     Medication Management: Evaluate patient's response, side effects, and tolerance of medication regimen.  Therapeutic Interventions: 1 to 1 sessions, Unit Group sessions and Medication administration.  Evaluation of Outcomes: Not Met   RN Treatment Plan for Primary Diagnosis: Adjustment disorder with mixed disturbance of emotions and conduct Long Term Goal(s): Knowledge of disease and therapeutic regimen to maintain health will improve  Short Term Goals: Ability to remain free from injury will improve, Ability to verbalize frustration and anger appropriately will improve, Ability to demonstrate self-control, Ability to participate in decision making will improve, Ability to verbalize feelings will improve, Ability to disclose and discuss suicidal ideas, Ability to identify and develop effective coping behaviors will improve and Compliance with prescribed medications will improve  Medication Management: RN will administer medications as ordered by provider, will assess and evaluate patient's response and provide education to patient for prescribed medication. RN will report any adverse and/or side effects to prescribing provider.  Therapeutic Interventions: 1 on 1 counseling sessions, Psychoeducation, Medication administration,  Evaluate responses to treatment, Monitor vital signs and CBGs as ordered, Perform/monitor CIWA, COWS, AIMS and Fall Risk screenings as ordered, Perform wound care treatments as ordered.  Evaluation of Outcomes: Not Met   LCSW Treatment Plan for Primary Diagnosis: Adjustment disorder with mixed disturbance of emotions and conduct Long Term Goal(s): Safe transition to appropriate next level of care at discharge, Engage patient in therapeutic group addressing interpersonal concerns.  Short Term Goals: Engage patient in aftercare planning with referrals and resources, Increase social support, Increase ability to  appropriately verbalize feelings, Increase emotional regulation, Identify triggers associated with mental health/substance abuse issues and Increase skills for wellness and recovery  Therapeutic Interventions: Assess for all discharge needs, 1 to 1 time with Social worker, Explore available resources and support systems, Assess for adequacy in community support network, Educate family and significant other(s) on suicide prevention, Complete Psychosocial Assessment, Interpersonal group therapy.  Evaluation of Outcomes: Not Met   Progress in Treatment: Attending groups: No. Participating in groups: No. Taking medication as prescribed: Yes. Toleration medication: Yes. Family/Significant other contact made: Yes, individual(s) contacted:  Elroy Channel, legal guardian, [facility]. Patient understands diagnosis: No. Discussing patient identified problems/goals with staff: Yes. Medical problems stabilized or resolved: Yes. Denies suicidal/homicidal ideation: Yes. Issues/concerns per patient self-inventory: No. Other: None.  New problem(s) identified: No, Describe:  none.  New Short Term/Long Term Goal(s): medication management for mood stabilization; elimination of SI thoughts; development of comprehensive mental wellness plan.  Patient Goals: Pt expressed desire for "a new group home and get a new guardian."   Discharge Plan or Barriers: Per guardian, pt can only return to her current group home as the whole state has been contacted. This has been explained to the pt.  Reason for Continuation of Hospitalization: Aggression Medication stabilization Suicidal ideation  Estimated Length of Stay: 1-7 days  Attendees: Patient: Crystal [name] 11/13/2020 10:12 AM  Physician: Selina Cooley, MD 11/13/2020 10:12 AM  Nursing: Collier Bullock, RN 11/13/2020 10:12 AM  RN Care Manager: 11/13/2020 10:12 AM  Social Worker: Chalmers Guest. Guerry Bruin, MSW, [place], [place] 11/13/2020 10:12 AM  Recreational Therapist: Devin Going, LRT  11/13/2020 10:12 AM  Other: Assunta Curtis, MSW, LCSW 11/13/2020 10:12 AM  Other: Kiva Martinique, MSW, LCSW-A 11/13/2020 10:12 AM  Other: 11/13/2020 10:12 AM    Scribe for Treatment Team: Shirl Harris, LCSW 11/13/2020 10:12 AM
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
## 2020-11-13 NOTE — Progress Notes (Signed)
Patient irritable this evening stating she wants to go to another facility. Patient given education. Patient continues to come up to the nursing station and using the call bell to ask about leaving tonight. Patient continues to be given education. Patient childlike with her behavior refusing medications tonight. Patient given education but refused. Patient being monitored Q 15 minutes for safety per unit protocol. Pt remains safe on the unit.

## 2020-11-13 NOTE — Progress Notes (Signed)
[name] MD Progress Note  11/13/2020 1:40 PM Crystal [name]  MRN:  [id number]  CC "Can we call my guardian."  Subjective:  Patient is a 30 year old female with 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 Patient is attending to ADLs, medication compliant. Patient was noted to have made several calls overnight and left voicemails threatening to kill her guardian, and harm her group home manager. Written note also left threatening to harm myself and unit social worker due to not getting her a new group home or guardian.   Patient seen during treatment team, one-on-one in office, and again for a conference call. This morning she continued to state her goal was to get a new group home, and a new guardian. She continues to state she feels threatened by her group home, but cannot elaborate further. She also admits to leaving threatening voicemails to guardian overnight, and message to [place]. She admits that she was hoping this would get her a new group home, but realizes this was not actually helpful. On conference call with guardian and her supervisor while myself, Shereda, and unit social worker in the room we reviewed that homeless shelters were not an option for placement, that [place] had declined guardianship at this time as she is not an established resident yet.  Guardian pointed out that with an improvement of patient's behaviors there is a possibility that patient can be moved to independent living or another group home.   Patient later approached staff to state she was ready to go back to group home, and behave herself. She also apologized for her behavioral outbursts on the phone. She also apologized for writing on her bedroom wall.    Principal Problem: Adjustment disorder with mixed disturbance of emotions and conduct Diagnosis: Principal Problem:   Adjustment disorder  with mixed disturbance of emotions and conduct Active Problems:   HIV (human immunodeficiency virus infection) ([place])  Total Time spent with patient: 30 minutes  Past Psychiatric History: See H&P  Past Medical History:  Past Medical History:  Diagnosis Date  . Cancer ([place])    skin  . Depression   . HIV (human immunodeficiency virus infection) ([place])     Past Surgical History:  Procedure Laterality Date  . SKIN SURGERY     Family History: History reviewed. No pertinent family history. Family Psychiatric  History: See H&P Social History:  Social History   Substance and Sexual Activity  Alcohol Use Not Currently     Social History   Substance and Sexual Activity  Drug Use Not Currently    Social History   Socioeconomic History  . Marital status: Single    Spouse name: Not on file  . Number of children: Not on file  . Years of education: Not on file  . Highest education level: Not on file  Occupational History  . Not on file  Tobacco Use  . Smoking status: Current Every Day Smoker    Packs/day: 0.50    Years: 12.00    Pack years: 6.00  . Smokeless tobacco: Never Used  Vaping Use  . Vaping Use: Never used  Substance and Sexual Activity  . Alcohol use: Not Currently  . Drug use: Not Currently  . Sexual activity: Not on file  Other Topics Concern  . Not on file  Social History Narrative  . Not on file   Social Determinants of Health   Financial Resource Strain: Not  on file  Food Insecurity: Not on file  Transportation Needs: Not on file  Physical Activity: Not on file  Stress: Not on file  Social Connections: Not on file   Additional Social History:                         Sleep: Fair  Appetite:  Fair  Current Medications: Current Facility-Administered Medications  Medication Dose Route Frequency Provider Last Rate Last Admin  . acetaminophen (TYLENOL) tablet 650 mg  650 mg Oral Q6H PRN Clapacs, John T, MD      . alum & mag  hydroxide-simeth (MAALOX/MYLANTA) 200-200-20 MG/5ML suspension 30 mL  30 mL Oral Q4H PRN Clapacs, Madie Reno, MD   30 mL at 11/12/20 2157  . ARIPiprazole (ABILIFY) tablet 20 mg  20 mg Oral Daily Clapacs, Madie Reno, MD   20 mg at 11/13/20 0748  . busPIRone (BUSPAR) tablet 30 mg  30 mg Oral BID Clapacs, Madie Reno, MD   30 mg at 11/13/20 0745  . chlorhexidine (PERIDEX) 0.12 % solution 15 mL  15 mL Mouth/Throat BID Clapacs, John T, MD   15 mL at 11/11/20 1821  . diphenhydrAMINE (BENADRYL) 2 % cream 1 application  1 application Topical BID PRN Clapacs, John T, MD      . docusate sodium (COLACE) capsule 100 mg  100 mg Oral BID Clapacs, Madie Reno, MD   100 mg at 11/13/20 0747  . dolutegravir (TIVICAY) tablet 50 mg  50 mg Oral Daily Clapacs, Madie Reno, MD   50 mg at 11/13/20 0749  . emtricitabine-rilpivir-tenofovir AF (ODEFSEY) 200-25-25 MG per tablet 1 tablet  1 tablet Oral Q breakfast Clapacs, Madie Reno, MD   1 tablet at 11/13/20 0749  . hydrOXYzine (ATARAX/VISTARIL) tablet 100 mg  100 mg Oral QHS Clapacs, Madie Reno, MD   100 mg at 11/12/20 2112  . ibuprofen (ADVIL) tablet 600 mg  600 mg Oral BID Clapacs, Madie Reno, MD   600 mg at 11/13/20 0744  . magnesium hydroxide (MILK OF MAGNESIA) suspension 30 mL  30 mL Oral Daily PRN Clapacs, John T, MD      . medroxyPROGESTERone (DEPO-PROVERA) injection 150 mg  150 mg Intramuscular Q90 days Clapacs, Madie Reno, MD   150 mg at 11/11/20 1809  . melatonin tablet 6 mg  6 mg Oral QHS Clapacs, Madie Reno, MD   6 mg at 11/12/20 2112  . sertraline (ZOLOFT) tablet 150 mg  150 mg Oral Daily Clapacs, Madie Reno, MD   150 mg at 11/13/20 3557    Lab Results:  Results for orders placed or performed during the hospital encounter of 11/11/20 (from the past 48 hour(s))  Lipid panel     Status: Abnormal   Collection Time: 11/12/20  6:39 AM  Result Value Ref Range   Cholesterol 169 0 - 200 mg/dL   Triglycerides 186 (H) <150 mg/dL   HDL 32 (L) >40 mg/dL   Total CHOL/HDL Ratio 5.3 RATIO   VLDL 37 0 - 40 mg/dL    LDL Cholesterol 100 (H) 0 - 99 mg/dL    Comment:        Total Cholesterol/HDL:CHD Risk Coronary Heart Disease Risk Table                     Men   Women  1/2 Average Risk   3.4   3.3  Average Risk       5.0   4.4  2 X Average Risk   9.6   7.1  3 X Average Risk  23.4   11.0        Use the calculated Patient Ratio above and the CHD Risk Table to determine the patient's CHD Risk.        ATP III CLASSIFICATION (LDL):  <100     mg/dL   Optimal  100-129  mg/dL   Near or Above                    Optimal  130-159  mg/dL   Borderline  160-189  mg/dL   High  >190     mg/dL   Very High Performed at  [facility], [place], [place] [address]   Hemoglobin A1c     Status: None   Collection Time: 11/12/20  6:39 AM  Result Value Ref Range   Hgb A1c MFr Bld 5.2 4.8 - 5.6 %    Comment: (NOTE) Pre diabetes:          5.7%-6.4%  Diabetes:              >6.4%  Glycemic control for   <7.0% adults with diabetes    Mean Plasma Glucose 102.54 mg/dL    Comment: Performed at [facility] [address], [place], [place] [address]    Blood Alcohol level:  Lab Results  Component Value Date   ETH <10 11/08/2020   ETH <10 43/32/9518    Metabolic Disorder Labs: Lab Results  Component Value Date   HGBA1C 5.2 11/12/2020   MPG 102.54 11/12/2020   No results found for: PROLACTIN Lab Results  Component Value Date   CHOL 169 11/12/2020   TRIG 186 (H) 11/12/2020   HDL 32 (L) 11/12/2020   CHOLHDL 5.3 11/12/2020   VLDL 37 11/12/2020   LDLCALC 100 (H) 11/12/2020    Physical Findings: AIMS:  , ,  ,  ,    CIWA:    COWS:     Musculoskeletal: Strength & Muscle Tone: within normal limits Gait & Station: normal Patient leans: N/A  Psychiatric Specialty Exam: Physical Exam   Review of Systems   Blood pressure 109/76, pulse (!) 103, temperature 97.8 F (36.6 C), temperature source Oral, resp. rate 17, height 5' 3.78" (1.62 m), weight 81.2 kg, last menstrual  period 10/22/2020, SpO2 97 %.Body mass index is 30.94 kg/m.  General Appearance: Casual  Eye Contact:  Fair  Speech:  Clear and Coherent  Volume:  Normal  Mood:  Euthymic  Affect:  Congruent  Thought Process:  Coherent  Orientation:  Full (Time, Place, and Person)  Thought Content:  Rumination  Suicidal Thoughts:  Yes.  with intent/plan  Homicidal Thoughts:  Yes.  with intent/plan  Memory:  Immediate;   Fair  Judgement:  Impaired  Insight:  Lacking  Psychomotor Activity:  Normal  Concentration:  Concentration: Fair  Recall:  [employer] of Knowledge:  Fair  Language:  Fair  Akathisia:  Negative  Handed:  Right  AIMS (if indicated):     Assets:  Desire for Improvement Financial Resources/Insurance Housing Physical Health Resilience Social Support  ADL's:  Intact  Cognition:  WNL  Sleep:  Number of Hours: 7.25     Treatment Plan Summary: Daily contact with patient to assess and evaluate symptoms and progress in treatment and Medication management  1) Adjustment Disorder with mixed disturbance of emotions and conduct- established problem, unstable - Continue Abilify 20  mg, Buspar 30 mg BID, and Zoloft 150 mg daily  - Metabolic screening completed  2) HIV- established problem, stable - Continue Tivicay and Odefsey  This visit lasted 45 minutes. Greater than 50% of the time was spent in counseling and coordination of care. See HPI for details of coordination with guardian, her supervisor, and unit social worker on plan of care.   Salley Scarlet, MD 11/13/2020, 1:40 PM

## 2020-11-13 NOTE — Progress Notes (Signed)
   11/13/20 1415  Clinical Encounter Type  Visited With Patient  Visit Type Initial;Spiritual support;Social support  Referral From Nurse  Consult/Referral To Chaplain  ?Staff informed Ch that Pt wanted to see him. I met with Pt. Pt told me she there because she tried to hurt herself. I use the [facility]. Patient asked for a bible and a devotional book. I left and retrieved both. I will follow up with Pt later.

## 2020-11-13 NOTE — Progress Notes (Signed)
Recreation Therapy Notes  INPATIENT RECREATION THERAPY ASSESSMENT  Patient Details Name: [name] MRN: [id number] DOB: November 08, 1990 Today's Date: 11/13/2020       Information Obtained From: Patient  Able to Participate in Assessment/Interview: Yes  Patient Presentation: Responsive  Reason for Admission (Per Patient): Active Symptoms  Patient Stressors:    Coping Skills:   Freight forwarder (2+):  Bryan ([employer] Jewel[employer])  Frequency of Recreation/Participation: Biomedical engineer of Community Resources:     [employer]:     Current Use:    If no, Barriers?:    Expressed Interest in [employer] Information:    [employer] of Residence:  Insurance underwriter  Patient Main Form of Transportation: Other (Comment) (Group home)  Patient Strengths:  N/A  Patient Identified Areas of Improvement:  Learn to cope with my anger  Patient Goal for Hospitalization:  Find a new gaurdian and group home.  Current SI (including self-harm):  No  Current HI:  No  Current AVH: No  Staff Intervention Plan: Collaborate with Interdisciplinary Treatment Team,Group Attendance  Consent to Intern Participation: N/A  Brahm Barbeau 11/13/2020, 1:53 PM

## 2020-11-13 NOTE — Progress Notes (Signed)
Recreation Therapy Notes   Date: 11/13/2020  Time: 9:30 am   Location: Craft room     Behavioral response: N/A   Intervention Topic: Stress Management   Discussion/Intervention: Patient did not attend group.   Clinical Observations/Feedback:  Patient did not attend group.   Railey Glad LRT/CTRS        Janeisha Ryle 11/13/2020 11:25 AM

## 2020-11-13 NOTE — BHH Counselor (Signed)
CSW spoke with [facility] with [facility]'s CST team.  She reports that a referral would have to come in for an assessment on Monday, Wednesday or Friday for an assessment.  She reports that the patient's guardian will need to be available, though has a telehealth option.  She reports that the guardian will need to complete all paperwork.    CSW attempted to contact the patient's guardian and left HIPAA compliant voicemail.  Assunta Curtis, MSW, LCSW 11/13/2020 2:15 PM

## 2020-11-14 ENCOUNTER — Ambulatory Visit: Payer: Self-pay | Admitting: Infectious Diseases

## 2020-11-14 MED ORDER — TIVICAY 50 MG PO TABS: 50.0000 mg | ORAL_TABLET | Freq: Every day | ORAL | 1 refills | Status: DC

## 2020-11-14 MED ORDER — SERTRALINE HCL 100 MG PO TABS: 150.0000 mg | ORAL_TABLET | Freq: Every day | ORAL | 1 refills | Status: DC

## 2020-11-14 MED ORDER — HYDROXYZINE PAMOATE 100 MG PO CAPS: 100.0000 mg | ORAL_CAPSULE | Freq: Every day | ORAL | 1 refills | Status: DC

## 2020-11-14 MED ORDER — CHLORHEXIDINE GLUCONATE 0.12 % MT SOLN: 15.0000 mL | Freq: Two times a day (BID) | OROMUCOSAL | 0 refills | Status: DC

## 2020-11-14 MED ORDER — BUSPIRONE HCL 30 MG PO TABS: 30.0000 mg | ORAL_TABLET | Freq: Two times a day (BID) | ORAL | 1 refills | Status: DC

## 2020-11-14 MED ORDER — MELATONIN 3 MG PO TABS: 6.0000 mg | ORAL_TABLET | Freq: Every day | ORAL | 1 refills | Status: DC

## 2020-11-14 MED ORDER — DIPHENHYDRAMINE HCL 2 % EX CREA: 1.0000 "application " | TOPICAL_CREAM | Freq: Two times a day (BID) | CUTANEOUS | 0 refills | Status: DC | PRN

## 2020-11-14 MED ORDER — DOCUSATE SODIUM 100 MG PO CAPS: 100.0000 mg | ORAL_CAPSULE | Freq: Two times a day (BID) | ORAL | 1 refills | Status: DC

## 2020-11-14 MED ORDER — COMPLERA 200-25-300 MG PO TABS: 1.0000 | ORAL_TABLET | Freq: Every day | ORAL | 1 refills | Status: DC

## 2020-11-14 MED ORDER — IBUPROFEN 600 MG PO TABS: 600.0000 mg | ORAL_TABLET | Freq: Two times a day (BID) | ORAL | 1 refills | Status: DC

## 2020-11-14 MED ORDER — ARIPIPRAZOLE 20 MG PO TABS: 20.0000 mg | ORAL_TABLET | Freq: Every day | ORAL | 1 refills | Status: DC

## 2020-11-14 NOTE — BHH Group Notes (Signed)
LCSW Group Therapy Note     11/14/2020 2:21 PM     Type of Therapy/Topic:  Group Therapy:  Balance in Life     Participation Level:  Active     Description of Group:    This group will address the concept of balance and how it feels and looks when one is unbalanced. Patients will be encouraged to process areas in their lives that are out of balance and identify reasons for remaining unbalanced. Facilitators will guide patients in utilizing problem-solving interventions to address and correct the stressor making their life unbalanced. Understanding and applying boundaries will be explored and addressed for obtaining and maintaining a balanced life. Patients will be encouraged to explore ways to assertively make their unbalanced needs known to significant others in their lives, using other group members and facilitator for support and feedback.     Therapeutic Goals:  1.      Patient will identify two or more emotions or situations they have that consume much of in their lives.  2.      Patient will identify signs/triggers that life has become out of balance:  3.      Patient will identify two ways to set boundaries in order to achieve balance in their lives:  4.      Patient will demonstrate ability to communicate their needs through discussion and/or role plays     Summary of Patient Progress: Pt stated that her family members, mother and aunt, can be difficult to deal with and that they are her emotional triggers. CSW discussed the value of cutting back on relationships that are difficult can be a positive way of setting boundaries. Pt stated that her memories of her childhood are traumatic and group members discussed that breathing techniques can help with coping emotionally.        Therapeutic Modalities:   Cognitive Behavioral Therapy  Solution-Focused Therapy  Assertiveness Training     Tip Atienza [place] MSW, [place]  11/14/2020 2:21 PM

## 2020-11-14 NOTE — NC FL2 (Signed)
[place] LEVEL OF CARE SCREENING TOOL     IDENTIFICATION  Patient Name: Crystal [name] Birthdate: [birth year] Sex: female Admission Date (Current Location): 11/11/2020  [place] and [place] Number:  Engineering geologist and Address:  [facility], [address], [place], [place] [address]      Provider Number: [id number]  Attending Physician Name and Address:  Salley Scarlet, MD  Relative Name and Phone Number:       Current Level of Care: Hospital Recommended Level of Care: [facility] (Comment) (Group Home) Prior Approval Number:    Date Approved/Denied:   PASRR Number:    Discharge Plan: Other (Comment) (ALF, Group Home, Family Care Home)    Current Diagnoses: Patient Active Problem List   Diagnosis Date Noted  . Self-inflicted laceration of wrist, initial encounter ([place]) 11/08/2020  . HIV (human immunodeficiency virus infection) ([place])   . 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    Orientation RESPIRATION BLADDER Height & Weight     Self,Time,Situation,Place   (NA) Continent Weight: 179 lb (81.2 kg) Height:  5' 3.78" (162 cm)  BEHAVIORAL SYMPTOMS/MOOD NEUROLOGICAL BOWEL NUTRITION STATUS  Verbally abusive,Wanderer (Patient has a history of leaving the home of the group home when upset.)  (NA) Continent  (NA)  AMBULATORY STATUS COMMUNICATION OF NEEDS Skin   Independent Verbally Normal                       Personal Care Assistance Level of Assistance   (NA)           Functional Limitations Info             SPECIAL CARE FACTORS FREQUENCY   (HIV)                    Contractures Contractures Info: Not present    Additional Factors Info  Code Status Code Status Info: Full             Current Medications (11/14/2020):  This is the current hospital active medication list Current  Facility-Administered Medications  Medication Dose Route Frequency Provider Last Rate Last Admin  . acetaminophen (TYLENOL) tablet 650 mg  650 mg Oral Q6H PRN Clapacs, John T, MD      . alum & mag hydroxide-simeth (MAALOX/MYLANTA) 200-200-20 MG/5ML suspension 30 mL  30 mL Oral Q4H PRN Clapacs, Madie Reno, MD   30 mL at 11/12/20 2157  . ARIPiprazole (ABILIFY) tablet 20 mg  20 mg Oral Daily Clapacs, Madie Reno, MD   20 mg at 11/14/20 0817  . busPIRone (BUSPAR) tablet 30 mg  30 mg Oral BID Clapacs, Madie Reno, MD   30 mg at 11/14/20 0816  . chlorhexidine (PERIDEX) 0.12 % solution 15 mL  15 mL Mouth/Throat BID Clapacs, John T, MD   15 mL at 11/11/20 1821  . diphenhydrAMINE (BENADRYL) 2 % cream 1 application  1 application Topical BID PRN Clapacs, John T, MD      . docusate sodium (COLACE) capsule 100 mg  100 mg Oral BID Clapacs, Madie Reno, MD   100 mg at 11/14/20 0816  . dolutegravir (TIVICAY) tablet 50 mg  50 mg Oral Daily Clapacs, Madie Reno, MD   50 mg at 11/14/20 0973  . emtricitabine-rilpivir-tenofovir AF (ODEFSEY) 200-25-25 MG per tablet 1 tablet  1 tablet Oral Q breakfast Clapacs, Madie Reno, MD   1 tablet  at 11/14/20 0819  . hydrOXYzine (ATARAX/VISTARIL) tablet 100 mg  100 mg Oral QHS Clapacs, Madie Reno, MD   100 mg at 11/12/20 2112  . ibuprofen (ADVIL) tablet 600 mg  600 mg Oral BID Clapacs, Madie Reno, MD   600 mg at 11/14/20 0816  . magnesium hydroxide (MILK OF MAGNESIA) suspension 30 mL  30 mL Oral Daily PRN Clapacs, John T, MD      . medroxyPROGESTERone (DEPO-PROVERA) injection 150 mg  150 mg Intramuscular Q90 days Clapacs, Madie Reno, MD   150 mg at 11/11/20 1809  . melatonin tablet 6 mg  6 mg Oral QHS Clapacs, Madie Reno, MD   6 mg at 11/12/20 2112  . sertraline (ZOLOFT) tablet 150 mg  150 mg Oral Daily Clapacs, Madie Reno, MD   150 mg at 11/14/20 6384     Discharge Medications: Please see discharge summary for a list of discharge medications.  Relevant Imaging Results:  Relevant Lab Results:   Additional Information     Rozann Lesches, LCSW

## 2020-11-14 NOTE — BHH Group Notes (Signed)
[place] Group Notes:  (Nursing/MHT/Case Management/Adjunct)  Date:  11/14/2020  Time:  9:09 AM  Type of Therapy:  Community Meeting  Participation Level:  Active  Participation Quality:  Appropriate and Attentive  Affect:  Appropriate  Cognitive:  Alert and Appropriate  Insight:  Appropriate  Engagement in Group:  Engaged  Modes of Intervention:  Discussion, Education and Support  Summary of Progress/Problems:  Adela Lank [facility] 11/14/2020, 9:09 AM

## 2020-11-14 NOTE — Progress Notes (Signed)
  [facility] Adult Case Management Discharge Plan :  Will you be returning to the same living situation after discharge:  Yes,  pt is returning to his group home.  At discharge, do you have transportation home?: Yes,  Group Home is providing transportation. Do you have the ability to pay for your medications: Yes,  Medicaid  Release of information consent forms completed and in the chart;  Patient's signature needed at discharge.  Patient to Follow up at:  Follow-up Information    [facility] Follow up.   Why: Appointment was unable to be scheduled due Medicaid not being in network.  They are available for crisis.  Walk in  hours are 8-4PM, MWF.  Thanks! Contact information: [address] [place] [address] [phone]               Next level of care provider has access to [facility] Link:no  Safety Planning and Suicide Prevention discussed: Yes,  SPE completed with her guardian.  Have you used any form of tobacco in the last 30 days? (Cigarettes, Smokeless Tobacco, Cigars, and/or Pipes): Yes  Has patient been referred to the Quitline?: Patient refused referral  Patient has been referred for addiction treatment: Pt. refused referral  Harden Mo, LCSW 11/14/2020, 3:18 PM

## 2020-11-14 NOTE — Progress Notes (Signed)
Pt denies SI, HI and AVH. Pt was educated on dc plan and verbalizes understanding.Pt received belongings and dc packet. Collier Bullock RN

## 2020-11-14 NOTE — Plan of Care (Signed)
Problem: Education: Goal: Knowledge of [facility] Education information/materials will improve 11/14/2020 1753 by Kieth Brightly, RN Outcome: Adequate for Discharge 11/14/2020 1113 by Kieth Brightly, RN Outcome: Progressing Goal: Emotional status will improve 11/14/2020 1753 by Kieth Brightly, RN Outcome: Adequate for Discharge 11/14/2020 1113 by Kieth Brightly, RN Outcome: Progressing Goal: Mental status will improve 11/14/2020 1753 by Kieth Brightly, RN Outcome: Adequate for Discharge 11/14/2020 1113 by Kieth Brightly, RN Outcome: Progressing Goal: Verbalization of understanding the information provided will improve 11/14/2020 1753 by Kieth Brightly, RN Outcome: Adequate for Discharge 11/14/2020 1113 by Kieth Brightly, RN Outcome: Progressing   Problem: Activity: Goal: Interest or engagement in activities will improve 11/14/2020 1753 by Kieth Brightly, RN Outcome: Adequate for Discharge 11/14/2020 1113 by Kieth Brightly, RN Outcome: Progressing Goal: Sleeping patterns will improve 11/14/2020 1753 by Kieth Brightly, RN Outcome: Adequate for Discharge 11/14/2020 1113 by Kieth Brightly, RN Outcome: Progressing   Problem: Coping: Goal: Ability to verbalize frustrations and anger appropriately will improve 11/14/2020 1753 by Kieth Brightly, RN Outcome: Adequate for Discharge 11/14/2020 1113 by Kieth Brightly, RN Outcome: Progressing Goal: Ability to demonstrate self-control will improve 11/14/2020 1753 by Kieth Brightly, RN Outcome: Adequate for Discharge 11/14/2020 1113 by Kieth Brightly, RN Outcome: Progressing   Problem: Health Behavior/Discharge Planning: Goal: Identification of resources available to assist in meeting health care needs will improve 11/14/2020 1753 by Kieth Brightly, RN Outcome: Adequate for Discharge 11/14/2020 1113 by Kieth Brightly, RN Outcome: Progressing Goal: Compliance with treatment plan for underlying cause of condition will  improve 11/14/2020 1753 by Kieth Brightly, RN Outcome: Adequate for Discharge 11/14/2020 1113 by Kieth Brightly, RN Outcome: Progressing   Problem: Physical Regulation: Goal: Ability to maintain clinical measurements within normal limits will improve 11/14/2020 1753 by Kieth Brightly, RN Outcome: Adequate for Discharge 11/14/2020 1113 by Kieth Brightly, RN Outcome: Progressing   Problem: Safety: Goal: Periods of time without injury will increase 11/14/2020 1753 by Kieth Brightly, RN Outcome: Adequate for Discharge 11/14/2020 1113 by Kieth Brightly, RN Outcome: Progressing   Problem: Education: Goal: Utilization of techniques to improve thought processes will improve 11/14/2020 1753 by Kieth Brightly, RN Outcome: Adequate for Discharge 11/14/2020 1113 by Kieth Brightly, RN Outcome: Progressing Goal: Knowledge of the prescribed therapeutic regimen will improve 11/14/2020 1753 by Kieth Brightly, RN Outcome: Adequate for Discharge 11/14/2020 1113 by Kieth Brightly, RN Outcome: Progressing   Problem: Activity: Goal: Interest or engagement in leisure activities will improve 11/14/2020 1753 by Kieth Brightly, RN Outcome: Adequate for Discharge 11/14/2020 1113 by Kieth Brightly, RN Outcome: Progressing Goal: Imbalance in normal sleep/wake cycle will improve 11/14/2020 1753 by Kieth Brightly, RN Outcome: Adequate for Discharge 11/14/2020 1113 by Kieth Brightly, RN Outcome: Progressing   Problem: Coping: Goal: Coping ability will improve 11/14/2020 1753 by Kieth Brightly, RN Outcome: Adequate for Discharge 11/14/2020 1113 by Kieth Brightly, RN Outcome: Progressing Goal: Will verbalize feelings 11/14/2020 1753 by Kieth Brightly, RN Outcome: Adequate for Discharge 11/14/2020 1113 by Kieth Brightly, RN Outcome: Progressing   Problem: Health Behavior/Discharge Planning: Goal: Ability to make decisions will improve 11/14/2020 1753 by Kieth Brightly, RN Outcome: Adequate  for Discharge 11/14/2020 1113 by Kieth Brightly, RN Outcome: Progressing Goal: Compliance with therapeutic regimen will improve 11/14/2020 1753 by Kieth Brightly, RN Outcome: Adequate for Discharge 11/14/2020  1113 by Kieth Brightly, RN Outcome: Progressing   Problem: Role Relationship: Goal: Will demonstrate positive changes in social behaviors and relationships 11/14/2020 1753 by Kieth Brightly, RN Outcome: Adequate for Discharge 11/14/2020 1113 by Kieth Brightly, RN Outcome: Progressing   Problem: Safety: Goal: Ability to disclose and discuss suicidal ideas will improve 11/14/2020 1753 by Kieth Brightly, RN Outcome: Adequate for Discharge 11/14/2020 1113 by Kieth Brightly, RN Outcome: Progressing Goal: Ability to identify and utilize support systems that promote safety will improve 11/14/2020 1753 by Kieth Brightly, RN Outcome: Adequate for Discharge 11/14/2020 1113 by Kieth Brightly, RN Outcome: Progressing   Problem: Self-Concept: Goal: Will verbalize positive feelings about self 11/14/2020 1753 by Kieth Brightly, RN Outcome: Adequate for Discharge 11/14/2020 1113 by Kieth Brightly, RN Outcome: Progressing Goal: Level of anxiety will decrease 11/14/2020 1753 by Kieth Brightly, RN Outcome: Adequate for Discharge 11/14/2020 1113 by Kieth Brightly, RN Outcome: Progressing
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
## 2020-11-14 NOTE — Plan of Care (Signed)
Pt rates depression 4/10, hopelessness and anxiety 10/10. Pt denies SI, HI and AVH. Pt was educated on dc plan and verbalizes understanding. Collier Bullock RN Problem: Education: Goal: Knowledge of [place] [facility] Education information/materials will improve Outcome: Progressing Goal: Emotional status will improve Outcome: Progressing Goal: Mental status will improve Outcome: Progressing Goal: Verbalization of understanding the information provided will improve Outcome: Progressing   Problem: Activity: Goal: Interest or engagement in activities will improve Outcome: Progressing Goal: Sleeping patterns will improve Outcome: Progressing   Problem: Coping: Goal: Ability to verbalize frustrations and anger appropriately will improve Outcome: Progressing Goal: Ability to demonstrate self-control will improve Outcome: Progressing   Problem: Health Behavior/Discharge Planning: Goal: Identification of resources available to assist in meeting health care needs will improve Outcome: Progressing Goal: Compliance with treatment plan for underlying cause of condition will improve Outcome: Progressing   Problem: Physical Regulation: Goal: Ability to maintain clinical measurements within normal limits will improve Outcome: Progressing   Problem: Safety: Goal: Periods of time without injury will increase Outcome: Progressing   Problem: Education: Goal: Utilization of techniques to improve thought processes will improve Outcome: Progressing Goal: Knowledge of the prescribed therapeutic regimen will improve Outcome: Progressing   Problem: Activity: Goal: Interest or engagement in leisure activities will improve Outcome: Progressing Goal: Imbalance in normal sleep/wake cycle will improve Outcome: Progressing   Problem: Coping: Goal: Coping ability will improve Outcome: Progressing Goal: Will verbalize feelings Outcome: Progressing   Problem: Health Behavior/Discharge  Planning: Goal: Ability to make decisions will improve Outcome: Progressing Goal: Compliance with therapeutic regimen will improve Outcome: Progressing   Problem: Role Relationship: Goal: Will demonstrate positive changes in social behaviors and relationships Outcome: Progressing   Problem: Safety: Goal: Ability to disclose and discuss suicidal ideas will improve Outcome: Progressing Goal: Ability to identify and utilize support systems that promote safety will improve Outcome: Progressing   Problem: Self-Concept: Goal: Will verbalize positive feelings about self Outcome: Progressing Goal: Level of anxiety will decrease Outcome: Progressing

## 2020-11-14 NOTE — Progress Notes (Signed)
Pt wrote with a crayon on her wall. It was foul language and absurdities towards staff.Product manager was notified. Room was searched and writing pen and crayon were taken away indefinitely. Education was given and pt understood. Emotional support was given and other options for her to do as in a workbook and a spiritual book she requested. Collier Bullock RN

## 2020-11-14 NOTE — Progress Notes (Signed)
[facility] MD Progress Note  11/14/2020 12:22 PM Crystal [name]  MRN:  [id number]  CC "Can I go home today?"  Subjective:  Patient is a 30 year old female with 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 No acute events overnight. Patient is attending to ADLs, medication compliant.   Patient seen one-on-one today. Patient notes that she slept well overnight, and has been eating well. Her behavior on the unit has improved since our conference call yesterday morning. She has not exhibited any more behavioral outbursts. She denies suicidal ideations, homicidal ideations, visual hallucinations, and auditory hallucinations. She feels ready to return to her group home, and behave herself once more. She is aware that good behaviors will allow her to possibly obtain a new guardian, a different group home, or perhaps more independent living. Social work team has reached out to group home to determine when she can return.    Principal Problem: Adjustment disorder with mixed disturbance of emotions and conduct Diagnosis: Principal Problem:   Adjustment disorder with mixed disturbance of emotions and conduct Active Problems:   HIV (human immunodeficiency virus infection) ([place])  Total Time spent with patient: 30 minutes  Past Psychiatric History: See H&P  Past Medical History:  Past Medical History:  Diagnosis Date  . Cancer ([place])    skin  . Depression   . HIV (human immunodeficiency virus infection) ([place])     Past Surgical History:  Procedure Laterality Date  . SKIN SURGERY     Family History: History reviewed. No pertinent family history. Family Psychiatric  History: See H&P Social History:  Social History   Substance and Sexual Activity  Alcohol Use Not Currently     Social History   Substance and Sexual Activity  Drug Use Not Currently    Social History    Socioeconomic History  . Marital status: Single    Spouse name: Not on file  . Number of children: Not on file  . Years of education: Not on file  . Highest education level: Not on file  Occupational History  . Not on file  Tobacco Use  . Smoking status: Current Every Day Smoker    Packs/day: 0.50    Years: 12.00    Pack years: 6.00  . Smokeless tobacco: Never Used  Vaping Use  . Vaping Use: Never used  Substance and Sexual Activity  . Alcohol use: Not Currently  . Drug use: Not Currently  . Sexual activity: Not on file  Other Topics Concern  . Not on file  Social History Narrative  . Not on file   Social Determinants of Health   Financial Resource Strain: Not on file  Food Insecurity: Not on file  Transportation Needs: Not on file  Physical Activity: Not on file  Stress: Not on file  Social Connections: Not on file   Additional Social History:                         Sleep: Fair  Appetite:  Fair  Current Medications: Current Facility-Administered Medications  Medication Dose Route Frequency Provider Last Rate Last Admin  . acetaminophen (TYLENOL) tablet 650 mg  650 mg Oral Q6H PRN Clapacs, John T, MD      . alum & mag hydroxide-simeth (MAALOX/MYLANTA) 200-200-20 MG/5ML suspension 30 mL  30 mL Oral Q4H PRN Clapacs, Madie Reno, MD   30 mL at 11/12/20 2157  . ARIPiprazole (ABILIFY) tablet  20 mg  20 mg Oral Daily Clapacs, Madie Reno, MD   20 mg at 11/14/20 0817  . busPIRone (BUSPAR) tablet 30 mg  30 mg Oral BID Clapacs, Madie Reno, MD   30 mg at 11/14/20 0816  . chlorhexidine (PERIDEX) 0.12 % solution 15 mL  15 mL Mouth/Throat BID Clapacs, John T, MD   15 mL at 11/11/20 1821  . diphenhydrAMINE (BENADRYL) 2 % cream 1 application  1 application Topical BID PRN Clapacs, John T, MD      . docusate sodium (COLACE) capsule 100 mg  100 mg Oral BID Clapacs, Madie Reno, MD   100 mg at 11/14/20 0816  . dolutegravir (TIVICAY) tablet 50 mg  50 mg Oral Daily Clapacs, Madie Reno, MD   50  mg at 11/14/20 2355  . emtricitabine-rilpivir-tenofovir AF (ODEFSEY) 200-25-25 MG per tablet 1 tablet  1 tablet Oral Q breakfast Clapacs, Madie Reno, MD   1 tablet at 11/14/20 7322  . hydrOXYzine (ATARAX/VISTARIL) tablet 100 mg  100 mg Oral QHS Clapacs, Madie Reno, MD   100 mg at 11/12/20 2112  . ibuprofen (ADVIL) tablet 600 mg  600 mg Oral BID Clapacs, Madie Reno, MD   600 mg at 11/14/20 0816  . magnesium hydroxide (MILK OF MAGNESIA) suspension 30 mL  30 mL Oral Daily PRN Clapacs, John T, MD      . medroxyPROGESTERone (DEPO-PROVERA) injection 150 mg  150 mg Intramuscular Q90 days Clapacs, Madie Reno, MD   150 mg at 11/11/20 1809  . melatonin tablet 6 mg  6 mg Oral QHS Clapacs, Madie Reno, MD   6 mg at 11/12/20 2112  . sertraline (ZOLOFT) tablet 150 mg  150 mg Oral Daily Clapacs, John T, MD   150 mg at 11/14/20 0254    Lab Results:  No results found for this or any previous visit (from the past 48 hour(s)).  Blood Alcohol level:  Lab Results  Component Value Date   ETH <10 11/08/2020   ETH <10 27/02/2375    Metabolic Disorder Labs: Lab Results  Component Value Date   HGBA1C 5.2 11/12/2020   MPG 102.54 11/12/2020   No results found for: PROLACTIN Lab Results  Component Value Date   CHOL 169 11/12/2020   TRIG 186 (H) 11/12/2020   HDL 32 (L) 11/12/2020   CHOLHDL 5.3 11/12/2020   VLDL 37 11/12/2020   LDLCALC 100 (H) 11/12/2020    Physical Findings: AIMS:  , ,  ,  ,    CIWA:    COWS:     Musculoskeletal: Strength & Muscle Tone: within normal limits Gait & Station: normal Patient leans: N/A  Psychiatric Specialty Exam: Physical Exam   Review of Systems   Blood pressure 115/74, pulse 73, temperature 98.1 F (36.7 C), temperature source Oral, resp. rate 17, height 5' 3.78" (1.62 m), weight 81.2 kg, last menstrual period 10/22/2020, SpO2 99 %.Body mass index is 30.94 kg/m.  General Appearance: Casual  Eye Contact:  Fair  Speech:  Clear and Coherent  Volume:  Normal  Mood:  Euthymic   Affect:  Congruent  Thought Process:  Coherent  Orientation:  Full (Time, Place, and Person)  Thought Content:  Logical  Suicidal Thoughts:  No  Homicidal Thoughts:  No  Memory:  Immediate;   Fair  Judgement:  Impaired  Insight:  Lacking  Psychomotor Activity:  Normal  Concentration:  Concentration: Fair  Recall:  [employer] of Knowledge:  Fair  Language:  Fair  Akathisia:  Negative  Handed:  Right  AIMS (if indicated):     Assets:  Desire for Improvement Financial Resources/Insurance Housing Physical Health Resilience Social Support  ADL's:  Intact  Cognition:  WNL  Sleep:  Number of Hours: 3     Treatment Plan Summary: Daily contact with patient to assess and evaluate symptoms and progress in treatment and Medication management  1) Adjustment Disorder with mixed disturbance of emotions and conduct- established problem, stable - Continue Abilify 20 mg, Buspar 30 mg BID, and Zoloft 150 mg daily  - Metabolic screening completed  2) HIV- established problem, stable - Continue Tivicay and Frederich Chick, MD 11/14/2020, 12:22 PM

## 2020-11-14 NOTE — BHH Counselor (Signed)
CSW attempted to call the patient's group home and left a HIPAA compliant voicemail.  Assunta Curtis, MSW, LCSW 11/14/2020 12:03 PM

## 2020-12-17 ENCOUNTER — Other Ambulatory Visit: Payer: Self-pay

## 2020-12-17 ENCOUNTER — Other Ambulatory Visit
Admission: RE | Admit: 2020-12-17 | Discharge: 2020-12-17 | Disposition: A | Discharge status: Home / Self Care | Payer: Medicaid Other | Source: Ambulatory Visit | Attending: Infectious Diseases | Admitting: Infectious Diseases

## 2020-12-17 ENCOUNTER — Encounter: Payer: Self-pay | Admitting: Infectious Diseases

## 2020-12-17 ENCOUNTER — Ambulatory Visit: Payer: Medicaid Other | Attending: Infectious Diseases | Admitting: Infectious Diseases

## 2020-12-17 VITALS — BP 102/70 | HR 87 | Temp 97.4°F | Wt 182.0 lb

## 2020-12-17 DIAGNOSIS — B20 Human immunodeficiency virus [HIV] disease: Secondary | ICD-10-CM | POA: Insufficient documentation

## 2020-12-17 DIAGNOSIS — E785 Hyperlipidemia, unspecified: Secondary | ICD-10-CM | POA: Insufficient documentation

## 2020-12-17 DIAGNOSIS — F70 Mild intellectual disabilities: Secondary | ICD-10-CM | POA: Insufficient documentation

## 2020-12-17 DIAGNOSIS — F32A Depression, unspecified: Secondary | ICD-10-CM | POA: Diagnosis not present

## 2020-12-17 DIAGNOSIS — Z79899 Other long term (current) drug therapy: Secondary | ICD-10-CM | POA: Diagnosis not present

## 2020-12-17 DIAGNOSIS — F4320 Adjustment disorder, unspecified: Secondary | ICD-10-CM | POA: Insufficient documentation

## 2020-12-17 DIAGNOSIS — F17210 Nicotine dependence, cigarettes, uncomplicated: Secondary | ICD-10-CM | POA: Diagnosis not present

## 2020-12-17 DIAGNOSIS — Z9152 Personal history of nonsuicidal self-harm: Secondary | ICD-10-CM | POA: Diagnosis not present

## 2020-12-17 DIAGNOSIS — Z793 Long term (current) use of hormonal contraceptives: Secondary | ICD-10-CM | POA: Diagnosis not present

## 2020-12-17 LAB — HEPATITIS PANEL, ACUTE
HCV Ab: NONREACTIVE
Hep A IgM: NONREACTIVE
Hep B C IgM: NONREACTIVE
Hepatitis B Surface Ag: NONREACTIVE

## 2020-12-17 LAB — COMPREHENSIVE METABOLIC PANEL
ALT: 16 U/L (ref 0–44)
AST: 15 U/L (ref 15–41)
Albumin: 4.6 g/dL (ref 3.5–5.0)
Alkaline Phosphatase: 65 U/L (ref 38–126)
Anion gap: 6 (ref 5–15)
BUN: 19 mg/dL (ref 6–20)
CO2: 25 mmol/L (ref 22–32)
Calcium: 9.4 mg/dL (ref 8.9–10.3)
Chloride: 106 mmol/L (ref 98–111)
Creatinine, Ser: 1.4 mg/dL — ABNORMAL HIGH (ref 0.44–1.00)
GFR, Estimated: 52 mL/min — ABNORMAL LOW (ref >60)
Glucose, Bld: 98 mg/dL (ref 70–99)
Potassium: 4.3 mmol/L (ref 3.5–5.1)
Sodium: 137 mmol/L (ref 135–145)
Total Bilirubin: 0.5 mg/dL (ref 0.3–1.2)
Total Protein: 7.9 g/dL (ref 6.5–8.1)

## 2020-12-17 NOTE — Patient Instructions (Signed)
You are here for engaging in HIV care- you were being treated by Dr.Rodriguez in [place] with complera and dolutegravir- but currently only on complera- will do labs today, will get all your records from your previous HIv provider

## 2020-12-17 NOTE — Progress Notes (Signed)
NAME: Crystal [name]  DOB: 1991/04/28  MRN: [id number]  Date/Time: 12/17/2020 12:14 PM   Subjective:  Crystal [name] is a 30 year old female is here to engage in HIV care. She is [facility] -the owner Crystal [name] is also with her during this visit.  Her Psychiatrist [name] FNP-PCP ? Crystal [name] is a 30 y.o. with a history of mild intellectual disability and adjustment disorder with behavioral disturbances was recently admitted to the behavioral health unit between 11/11/2020 until 11/14/2020 for suicidal ideation and self-inflicted cuts to bilateral wrists. Patient moved to [facility] recently.  Prior to that she was getting care from Dr. Patsey Berthold in [place] for HIV she stays She was diagnosed with HIV when she was 30 years old. We do not have any records of her prior treatment. As per the group home owner she is on Complera and supposed to be on dolutegravir but their pharmacy does not carry that medicine. HIV diagnosed ? Nadir Cd4 unknown VL unknown OI unknown HAARt history unknown Acquired thru unknown Genotype unknown ? Past Medical History:  Diagnosis Date  . Cancer ([place])    skin  . Depression   . HIV (human immunodeficiency virus infection) ([place])   Adjustment disorder Behavioral disorder Mild  intellectual disability Past Surgical History:  Procedure Laterality Date  . SKIN SURGERY    Skin surgery nose for skin cancer Social History   Socioeconomic History  . Marital status: Single    Spouse name: Not on file  . Number of children: Not on file  . Years of education: Not on file  . Highest education level: Not on file  Occupational History  . Not on file  Tobacco Use  . Smoking status: Current Every Day Smoker    Packs/day: 0.50    Years: 12.00    Pack years: 6.00  . Smokeless tobacco: Never Used  Vaping Use  . Vaping Use: Never used  Substance and Sexual Activity  . Alcohol  use: Not Currently  . Drug use: Not Currently  . Sexual activity: Not on file  Other Topics Concern  . Not on file  Social History Narrative  . Not on file   Social Determinants of Health   Financial Resource Strain: Not on file  Food Insecurity: Not on file  Transportation Needs: Not on file  Physical Activity: Not on file  Stress: Not on file  Social Connections: Not on file  Intimate Partner Violence: Not on file    No family history on file. No Known Allergies ? Current Outpatient Medications  Medication Sig Dispense Refill  . ARIPiprazole (ABILIFY) 20 MG tablet Take 1 tablet (20 mg total) by mouth daily. 30 tablet 1  . atorvastatin (LIPITOR) 20 MG tablet Take 20 mg by mouth daily.    . busPIRone (BUSPAR) 30 MG tablet Take 1 tablet (30 mg total) by mouth 2 (two) times daily. 60 tablet 1  . chlorhexidine (PERIDEX) 0.12 % solution Use as directed 15 mLs in the mouth or throat 2 (two) times daily. 1893 mL 0  . Cholecalciferol (VITAMIN D3) 1.25 MG (50000 UT) TABS Take by mouth.    . diphenhydrAMINE (BENADRYL) 2 % cream Apply 1 application topically 2 (two) times daily as needed for itching. (apply to feet) 30 g 0  . docusate sodium (COLACE) 100 MG capsule Take 1 capsule (100 mg total) by mouth 2 (two) times daily. 60 capsule 1  . dolutegravir (TIVICAY) 50 MG  tablet Take 1 tablet (50 mg total) by mouth daily. 30 tablet 1  . emtricitabine-rilpivir-tenofovir DF (COMPLERA) 200-25-300 MG tablet Take 1 tablet by mouth daily. 30 tablet 1  . hydrOXYzine (VISTARIL) 100 MG capsule Take 1 capsule (100 mg total) by mouth at bedtime. 30 capsule 1  . ibuprofen (ADVIL) 600 MG tablet Take 1 tablet (600 mg total) by mouth 2 (two) times daily. 60 tablet 1  . medroxyPROGESTERone (DEPO-PROVERA) 150 MG/ML injection Inject 150 mg into the muscle every 3 (three) months.    . melatonin 3 MG TABS tablet Take 2 tablets (6 mg total) by mouth at bedtime. 60 tablet 1  . sertraline (ZOLOFT) 100 MG tablet Take  1.5 tablets (150 mg total) by mouth daily. 45 tablet 1  . vitamin B-12 (CYANOCOBALAMIN) 1000 MCG tablet Take 1,000 mcg by mouth daily.     No current facility-administered medications for this visit.    REVIEW OF SYSTEMS:  Const: negative fever, negative chills, negative weight loss Eyes: negative diplopia or visual changes, negative eye pain ENT: negative coryza, negative sore throat Resp: negative cough, hemoptysis, dyspnea Cards: negative for chest pain, palpitations, lower extremity edema GU: negative for frequency, dysuria and hematuria Skin: negative for rash and pruritus Heme: negative for easy bruising and gum/nose bleeding MS: negative for myalgias, arthralgias, back pain and muscle weakness Neurolo:negative for headaches, dizziness, vertigo, memory problems  Psych: As above Objective:  VITALS:  BP 102/70   Pulse 87   Temp (!) 97.4 F (36.3 C) (Oral)   Wt 182 lb (82.6 kg)   BMI 31.46 kg/m  PHYSICAL EXAM:  General: Alert, cooperative, no distress, appears stated age.  Head: Normocephalic, without obvious abnormality, atraumatic. Eyes: Conjunctivae clear, anicteric sclerae. Pupils are equal Nose: Skin on the ala nasi on the right is transplanted skin.  Nares normal. No drainage or sinus tenderness. Throat: Lips, mucosa, and tongue normal. No Thrush Neck: Supple, symmetrical, no adenopathy, thyroid: non tender no carotid bruit and no JVD. Back: No CVA tenderness. Lungs: Clear to auscultation bilaterally. No Wheezing or Rhonchi. No rales. Heart: Regular rate and rhythm, no murmur, rub or gallop. Abdomen: Soft, non-tender,not distended. Bowel sounds normal. No masses Extremities: Extremities normal, atraumatic, no cyanosis. No edema. No clubbing Skin: No rashes or lesions. Not Jaundiced Lymph: Cervical, supraclavicular normal. Neurologic: Grossly non-focal Pertinent Labs None available  health maintenance Vaccination  Vaccine Date last given comment  Influenza     Hepatitis B    Hepatitis A    Prevnar-PCV-13    Pneumovac-PPSV-23    TdaP    HPV    Shingrix ( zoster vaccine)     ______________________  Labs Lab Result  Date comment  HIV VL     CD4     Genotype     [id number]     HIV antibody     RPR     Quantiferon Gold     Hep C ab     Hepatitis B-ab,ag,c     Hepatitis A-IgM, IgG /T     Lipid     GC/CHL     PAP     HB,PLT,Cr, LFT       Preventive  Procedure Result  Date comment  colonoscopy     Mammogram     Dental exam     Opthal       Impression/Recommendation ? HIV disease on Complera/dolutegravir but she has not been taking the latter for the last 2 months Do not have any prior  records. We will get the labs today including CD4, HIV RNA, CMP, CBC, RPR, QuantiFERON gold and hepatitis panel. Continue Complera.  Once we have the labs can then decide on dolutegravir which is to be came  Behavioral disorder, adjustment disorder, mild intellectual disability. On sertraline, Vistaril, aripiprazole and buspirone  Hyperlipidemia on atorvastatin  On medroxyprogesterone as contraception ? ? We will get records from prior HIV provider and will see her back in a month.  Discussed the management with the patient and the caregiver.

## 2020-12-18 LAB — T-HELPER CELLS CD4/CD8 %
% CD 4 Pos. Lymph.: 51.6 % (ref 30.8–58.5)
Absolute CD 4 Helper: 1290 /uL (ref 359–1519)
Basophils Absolute: 0.1 10*3/uL (ref 0.0–0.2)
Basos: 1 %
CD3+CD4+ Cells/CD3+CD8+ Cells Bld: 1.87 (ref 0.92–3.72)
CD3+CD8+ Cells # Bld: 690 /uL (ref 109–897)
CD3+CD8+ Cells NFr Bld: 27.6 % (ref 12.0–35.5)
EOS (ABSOLUTE): 0.2 10*3/uL (ref 0.0–0.4)
Eos: 2 %
Hematocrit: 39 % (ref 34.0–46.6)
Hemoglobin: 12.8 g/dL (ref 11.1–15.9)
Immature Grans (Abs): 0.1 10*3/uL (ref 0.0–0.1)
Immature Granulocytes: 1 %
Lymphocytes Absolute: 2.5 10*3/uL (ref 0.7–3.1)
Lymphs: 24 %
MCH: 29.8 pg (ref 26.6–33.0)
MCHC: 32.8 g/dL (ref 31.5–35.7)
MCV: 91 fL (ref 79–97)
Monocytes Absolute: 0.7 10*3/uL (ref 0.1–0.9)
Monocytes: 7 %
Neutrophils Absolute: 6.7 10*3/uL (ref 1.4–7.0)
Neutrophils: 65 %
Platelets: 245 10*3/uL (ref 150–450)
RBC: 4.29 x10E6/uL (ref 3.77–5.28)
RDW: 12.4 % (ref 11.7–15.4)
WBC: 10.1 10*3/uL (ref 3.4–10.8)

## 2020-12-18 LAB — RPR: RPR Ser Ql: NONREACTIVE

## 2020-12-18 LAB — HIV-1 RNA QUANT-NO REFLEX-BLD
HIV 1 RNA Quant: <20 copies/mL
LOG10 HIV-1 RNA: UNDETERMINED log10copy/mL

## 2020-12-18 LAB — HEPATITIS B SURFACE ANTIBODY, QUANTITATIVE: Hep B S AB Quant (Post): 16.5 m[IU]/mL (ref >9.9)

## 2020-12-19 LAB — QUANTIFERON-TB GOLD PLUS: QuantiFERON-TB Gold Plus: NEGATIVE

## 2020-12-19 LAB — GENOSURE PRIME (GSPRIL)

## 2020-12-19 LAB — QUANTIFERON-TB GOLD PLUS (RQFGPL)
QuantiFERON Mitogen Value: >10 IU/mL
QuantiFERON Nil Value: 0.01 IU/mL
QuantiFERON TB1 Ag Value: 0.01 IU/mL
QuantiFERON TB2 Ag Value: 0.01 IU/mL

## 2020-12-24 ENCOUNTER — Telehealth: Payer: Self-pay

## 2020-12-24 NOTE — Telephone Encounter (Signed)
Received call from Deborra Medina inquiring about patient's recent lab work as she states Dr. Delaine Lame mentioned possibly changing the patient to Tivicay based on lab results. If medication switch is made, she would like the medication sent to [facility]. Will route to provider.   Beryle Flock, RN

## 2020-12-25 ENCOUNTER — Emergency Department
Admission: EM | Admit: 2020-12-25 | Discharge: 2020-12-26 | Disposition: A | Discharge status: Home / Self Care | Payer: Medicaid Other | Attending: Emergency Medicine | Admitting: Emergency Medicine

## 2020-12-25 ENCOUNTER — Other Ambulatory Visit: Payer: Self-pay

## 2020-12-25 DIAGNOSIS — Z21 Asymptomatic human immunodeficiency virus [HIV] infection status: Secondary | ICD-10-CM | POA: Diagnosis present

## 2020-12-25 DIAGNOSIS — F4325 Adjustment disorder with mixed disturbance of emotions and conduct: Secondary | ICD-10-CM | POA: Diagnosis not present

## 2020-12-25 DIAGNOSIS — F79 Unspecified intellectual disabilities: Secondary | ICD-10-CM | POA: Insufficient documentation

## 2020-12-25 DIAGNOSIS — B20 Human immunodeficiency virus [HIV] disease: Secondary | ICD-10-CM | POA: Diagnosis present

## 2020-12-25 DIAGNOSIS — X789XXA Intentional self-harm by unspecified sharp object, initial encounter: Secondary | ICD-10-CM | POA: Diagnosis present

## 2020-12-25 DIAGNOSIS — Z85828 Personal history of other malignant neoplasm of skin: Secondary | ICD-10-CM | POA: Insufficient documentation

## 2020-12-25 DIAGNOSIS — F17210 Nicotine dependence, cigarettes, uncomplicated: Secondary | ICD-10-CM | POA: Insufficient documentation

## 2020-12-25 DIAGNOSIS — S61519A Laceration without foreign body of unspecified wrist, initial encounter: Secondary | ICD-10-CM | POA: Diagnosis present

## 2020-12-25 DIAGNOSIS — Z046 Encounter for general psychiatric examination, requested by authority: Secondary | ICD-10-CM | POA: Diagnosis present

## 2020-12-25 DIAGNOSIS — F70 Mild intellectual disabilities: Secondary | ICD-10-CM | POA: Diagnosis present

## 2020-12-25 DIAGNOSIS — R45851 Suicidal ideations: Secondary | ICD-10-CM | POA: Insufficient documentation

## 2020-12-25 LAB — COMPREHENSIVE METABOLIC PANEL
ALT: 16 U/L (ref 0–44)
AST: 18 U/L (ref 15–41)
Albumin: 4.6 g/dL (ref 3.5–5.0)
Alkaline Phosphatase: 65 U/L (ref 38–126)
Anion gap: 8 (ref 5–15)
BUN: 17 mg/dL (ref 6–20)
CO2: 21 mmol/L — ABNORMAL LOW (ref 22–32)
Calcium: 9.2 mg/dL (ref 8.9–10.3)
Chloride: 111 mmol/L (ref 98–111)
Creatinine, Ser: 1.08 mg/dL — ABNORMAL HIGH (ref 0.44–1.00)
GFR, Estimated: >60 mL/min (ref >60)
Glucose, Bld: 103 mg/dL — ABNORMAL HIGH (ref 70–99)
Potassium: 3.9 mmol/L (ref 3.5–5.1)
Sodium: 140 mmol/L (ref 135–145)
Total Bilirubin: 0.4 mg/dL (ref 0.3–1.2)
Total Protein: 7.7 g/dL (ref 6.5–8.1)

## 2020-12-25 LAB — ETHANOL: Alcohol, Ethyl (B): <10 mg/dL (ref <10)

## 2020-12-25 LAB — CBC
HCT: 38.3 % (ref 36.0–46.0)
Hemoglobin: 12.8 g/dL (ref 12.0–15.0)
MCH: 30.6 pg (ref 26.0–34.0)
MCHC: 33.4 g/dL (ref 30.0–36.0)
MCV: 91.6 fL (ref 80.0–100.0)
Platelets: 252 10*3/uL (ref 150–400)
RBC: 4.18 MIL/uL (ref 3.87–5.11)
RDW: 12.6 % (ref 11.5–15.5)
WBC: 11.3 10*3/uL — ABNORMAL HIGH (ref 4.0–10.5)
nRBC: 0 % (ref 0.0–0.2)

## 2020-12-25 LAB — URINE DRUG SCREEN, QUALITATIVE (ARMC ONLY)
Amphetamines, Ur Screen: NOT DETECTED
Barbiturates, Ur Screen: NOT DETECTED
Benzodiazepine, Ur Scrn: NOT DETECTED
Cannabinoid 50 Ng, Ur ~~LOC~~: NOT DETECTED
Cocaine Metabolite,Ur ~~LOC~~: NOT DETECTED
MDMA (Ecstasy)Ur Screen: NOT DETECTED
Methadone Scn, Ur: NOT DETECTED
Opiate, Ur Screen: NOT DETECTED
Phencyclidine (PCP) Ur S: NOT DETECTED
Tricyclic, Ur Screen: POSITIVE — AB

## 2020-12-25 LAB — ACETAMINOPHEN LEVEL: Acetaminophen (Tylenol), Serum: <10 ug/mL — ABNORMAL LOW (ref 10–30)

## 2020-12-25 LAB — SALICYLATE LEVEL: Salicylate Lvl: <7 mg/dL — ABNORMAL LOW (ref 7.0–30.0)

## 2020-12-25 NOTE — ED Notes (Signed)
In triage pt dressed out and placed a shirt, pants, a belt, pair of underwear and socks as well as shoes in the bag and her personal papers

## 2020-12-25 NOTE — ED Triage Notes (Signed)
Pt arrives via [facility], from the [facility] on [address] Pt was reported to have ran away from the group home today, pt reports suicidal thoughts and states that she plans to break glass and cut her self with it, states that she has done that in the past.pt's ivc paperwork states she is diagnosed with mild intellectual disability, anxiety, and possible autism. Pt's guardian is Facilities manager at [phone], after hours # [phone] Pt resides at [facility] at [phone] Pt states that she is still having suicidal thoughts with the same plan Paperwork also states homicidal thoughts towards staff

## 2020-12-25 NOTE — ED Notes (Signed)
Report received from [facility]. Pt transferred to [place] [facility]. Pt oriented to unit by this RN. Pt denies any needs at this time. NAD noted. Will assess vitals q8hr per protocol.
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
## 2020-12-26 DIAGNOSIS — F4325 Adjustment disorder with mixed disturbance of emotions and conduct: Secondary | ICD-10-CM

## 2020-12-26 LAB — PREGNANCY, URINE: Preg Test, Ur: NEGATIVE

## 2020-12-26 MED ORDER — BUSPIRONE HCL 10 MG PO TABS: 30.0000 mg | ORAL_TABLET | Freq: Two times a day (BID) | ORAL | Status: DC

## 2020-12-26 MED ORDER — DOCUSATE SODIUM 100 MG PO CAPS: 100.0000 mg | ORAL_CAPSULE | Freq: Two times a day (BID) | ORAL | Status: DC

## 2020-12-26 MED ORDER — DOLUTEGRAVIR SODIUM 50 MG PO TABS: 50.0000 mg | ORAL_TABLET | Freq: Every day | ORAL | Status: DC

## 2020-12-26 MED ORDER — RILPIVIRINE HCL 25 MG PO TABS
25.0000 mg | ORAL_TABLET | Freq: Every day | ORAL | Status: DC
  Filled 2020-12-26: qty 1

## 2020-12-26 MED ORDER — EMTRICITABINE-TENOFOVIR DF 200-300 MG PO TABS
1.0000 | ORAL_TABLET | Freq: Every day | ORAL | Status: DC
  Filled 2020-12-26: qty 1

## 2020-12-26 MED ORDER — VITAMIN B-12 1000 MCG PO TABS: 1000.0000 ug | ORAL_TABLET | Freq: Every day | ORAL | Status: DC

## 2020-12-26 MED ORDER — SERTRALINE HCL 50 MG PO TABS: 150.0000 mg | ORAL_TABLET | Freq: Every day | ORAL | Status: DC

## 2020-12-26 MED ORDER — BICTEGRAVIR-EMTRICITAB-TENOFOV 50-200-25 MG PO TABS: 1.0000 | ORAL_TABLET | Freq: Every day | ORAL | Status: DC

## 2020-12-26 MED ORDER — ARIPIPRAZOLE 10 MG PO TABS: 20.0000 mg | ORAL_TABLET | Freq: Every day | ORAL | Status: DC

## 2020-12-26 MED ORDER — ATORVASTATIN CALCIUM 20 MG PO TABS: 20.0000 mg | ORAL_TABLET | Freq: Every day | ORAL | Status: DC

## 2020-12-26 NOTE — ED Notes (Signed)
Pt is throwing her breakfast tray and pillow into the hall.

## 2020-12-26 NOTE — Telephone Encounter (Signed)
Since her viral load was undetectable and her CD4 fine.  We can continue just the Complera.  She was not on the Tivicay when these labs were done. FU Dr Ulice Dash as scheduled

## 2020-12-26 NOTE — ED Notes (Signed)
Pt discharged back to group home. Refused VS. Belongings returned to patient. Discharge papers reviewed with legal guardian.

## 2020-12-26 NOTE — Telephone Encounter (Signed)
Per last office note, patient regimen is supposed to be Complera and Tivicay, but patient has not been taking Tivicay for last two months. Crystal [name] called to find out if patient should continue with Tivicay or discontinue based on labs. Will route to provider.   Beryle Flock, RN

## 2020-12-26 NOTE — ED Notes (Signed)
Pt is angry she is returning to her group home and is refusing VS.
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
## 2020-12-26 NOTE — ED Notes (Addendum)
RN spoke with  legal guardian Elroy Channel.  There are no concerns regarding the patient returning to her group home.

## 2020-12-26 NOTE — Telephone Encounter (Signed)
RN spoke with Deborra Medina and advised her per Dr. Ola Spurr that patient should continue with regimen of Complera and no need for Tivicay. Crystal [name] verbalized understanding and has no further questions. Crystal [name] states she will fax our office a records request for patient labs.   Beryle Flock, RN

## 2020-12-26 NOTE — ED Notes (Signed)
Pt to door requesting paper and something to write with. Pt given paper and crayon. Pt also asking about phone time and when she will be able to speak to psych doctor. Pt informed she will be able to use phone at nine and we are unable to tell her when doctor will be in to speak with her. Pt verbalized understanding.

## 2020-12-26 NOTE — ED Notes (Signed)
RN spoke with Crystal [name] about patient's discharge.  Pt will be picked up within the next 20 minutes.
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
## 2020-12-26 NOTE — ED Notes (Signed)
IVC/Consult Completed/Obs.& Reassess in Am

## 2020-12-26 NOTE — ED Notes (Signed)
Pt sleeping at this time. NAD noted. Will reassess vitals once pt awake.

## 2021-01-21 ENCOUNTER — Ambulatory Visit: Payer: Medicaid Other | Admitting: Infectious Diseases

## 2021-02-25 ENCOUNTER — Ambulatory Visit: Payer: Medicaid Other | Attending: Infectious Diseases | Admitting: Infectious Diseases

## 2021-02-25 ENCOUNTER — Other Ambulatory Visit: Payer: Self-pay

## 2021-02-25 DIAGNOSIS — E785 Hyperlipidemia, unspecified: Secondary | ICD-10-CM | POA: Insufficient documentation

## 2021-02-25 DIAGNOSIS — B20 Human immunodeficiency virus [HIV] disease: Secondary | ICD-10-CM | POA: Insufficient documentation

## 2021-02-25 DIAGNOSIS — F17210 Nicotine dependence, cigarettes, uncomplicated: Secondary | ICD-10-CM | POA: Diagnosis not present

## 2021-02-25 DIAGNOSIS — F4320 Adjustment disorder, unspecified: Secondary | ICD-10-CM | POA: Insufficient documentation

## 2021-02-25 DIAGNOSIS — F70 Mild intellectual disabilities: Secondary | ICD-10-CM | POA: Diagnosis not present

## 2021-02-25 DIAGNOSIS — Z79899 Other long term (current) drug therapy: Secondary | ICD-10-CM | POA: Insufficient documentation

## 2021-02-25 NOTE — Progress Notes (Signed)
NAME: Crystal [name]  DOB: 03-13-1991  MRN: [id number]  Date/Time: 02/25/2021 11:15 AM   Subjective:  Follow-up visit for HIV.  She is here with her group home owner Crystal [name]. She is [facility] -the owner Crystal [name] [phone] is also with her during this visit.    Her Psychiatrist [name] ? Crystal [name] is a 30 y.o. 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 I first saw her in March 2022. Patient moved to [facility] recently.  Prior to that she was getting care from Crystal [name] in [place]. She was diagnosed with HIV when she was 30 years old. Patient was on Complera until 2021 as prescribed by Crystal [name].  On reviewing the records from Crystal [name] office she always had undetectable viral load and a high CD4 count.  HIV diagnosed ? Nadir Cd4 unknown VL unknown OI unknown HAARt history unknown Genotype unknown ? Past Medical History:  Diagnosis Date  . Cancer ([place])    skin  . Depression   . HIV (human immunodeficiency virus infection) ([place])   Adjustment disorder Behavioral disorder Mild  intellectual disability Past Surgical History:  Procedure Laterality Date  . SKIN SURGERY    Skin surgery nose for skin cancer Social History   Socioeconomic History  . Marital status: Single    Spouse name: Not on file  . Number of children: Not on file  . Years of education: Not on file  . Highest education level: Not on file  Occupational History  . Not on file  Tobacco Use  . Smoking status: Current Every Day Smoker    Packs/day: 0.50    Years: 12.00    Pack years: 6.00  . Smokeless tobacco: Never Used  Vaping Use  . Vaping Use: Never used  Substance and Sexual Activity  . Alcohol use: Not Currently  . Drug use: Not Currently  . Sexual activity: Not on file  Other Topics Concern  . Not on file  Social  History Narrative  . Not on file   Social Determinants of Health   Financial Resource Strain: Not on file  Food Insecurity: Not on file  Transportation Needs: Not on file  Physical Activity: Not on file  Stress: Not on file  Social Connections: Not on file  Intimate Partner Violence: Not on file    No family history on file. No Known Allergies ? Current Outpatient Medications  Medication Sig Dispense Refill  . ARIPiprazole (ABILIFY) 20 MG tablet Take 1 tablet (20 mg total) by mouth daily. 30 tablet 1  . atorvastatin (LIPITOR) 20 MG tablet Take 20 mg by mouth daily.    . busPIRone (BUSPAR) 30 MG tablet Take 1 tablet (30 mg total) by mouth 2 (two) times daily. 60 tablet 1  . chlorhexidine (PERIDEX) 0.12 % solution Use as directed 15 mLs in the mouth or throat 2 (two) times daily. 1893 mL 0  . Cholecalciferol (VITAMIN D3) 1.25 MG (50000 UT) TABS Take by mouth.    . diphenhydrAMINE (BENADRYL) 2 % cream Apply 1 application topically 2 (two) times daily as needed for itching. (apply to feet) 30 g 0  . docusate sodium (COLACE) 100 MG capsule Take 1 capsule (100 mg total) by mouth 2 (two) times daily. 60 capsule 1  . dolutegravir (TIVICAY) 50 MG tablet Take 1 tablet (50 mg total) by mouth daily. 30 tablet 1  .  emtricitabine-rilpivir-tenofovir DF (COMPLERA) 200-25-300 MG tablet Take 1 tablet by mouth daily. 30 tablet 1  . hydrOXYzine (VISTARIL) 100 MG capsule Take 1 capsule (100 mg total) by mouth at bedtime. 30 capsule 1  . ibuprofen (ADVIL) 600 MG tablet Take 1 tablet (600 mg total) by mouth 2 (two) times daily. 60 tablet 1  . medroxyPROGESTERone (DEPO-PROVERA) 150 MG/ML injection Inject 150 mg into the muscle every 3 (three) months.    . melatonin 3 MG TABS tablet Take 2 tablets (6 mg total) by mouth at bedtime. 60 tablet 1  . sertraline (ZOLOFT) 100 MG tablet Take 1.5 tablets (150 mg total) by mouth daily. 45 tablet 1  . vitamin B-12 (CYANOCOBALAMIN) 1000 MCG tablet Take 1,000 mcg by mouth  daily.     No current facility-administered medications for this visit.    REVIEW OF SYSTEMS:  Const: negative fever, negative chills, negative weight loss Eyes: negative diplopia or visual changes, negative eye pain ENT: negative coryza, negative sore throat Resp: negative cough, hemoptysis, dyspnea Cards: negative for chest pain, palpitations, lower extremity edema GU: negative for frequency, dysuria and hematuria Skin: negative for rash and pruritus Heme: negative for easy bruising and gum/nose bleeding MS: negative for myalgias, arthralgias, back pain and muscle weakness Neurolo:negative for headaches, dizziness, vertigo, memory problems  Psych: As above Objective:  VITALS:  There were no vitals taken for this visit. PHYSICAL EXAM:  General: Alert, cooperative, no distress, appears stated age.  Head: Normocephalic, without obvious abnormality, atraumatic. Eyes: Conjunctivae clear, anicteric sclerae. Pupils are equal Nose: Skin on the ala nasi on the right is transplanted skin.  Nares normal. No drainage or sinus tenderness. Throat: Lips, mucosa, and tongue normal. No Thrush Neck: Supple, symmetrical, no adenopathy, thyroid: non tender no carotid bruit and no JVD. Back: No CVA tenderness. Lungs: Clear to auscultation bilaterally. No Wheezing or Rhonchi. No rales. Heart: Regular rate and rhythm, no murmur, rub or gallop. Abdomen: Soft, non-tender,not distended. Bowel sounds normal. No masses Extremities: Extremities normal, atraumatic, no cyanosis. No edema. No clubbing Skin: No rashes or lesions. Not Jaundiced Lymph: Cervical, supraclavicular normal. Neurologic: Grossly non-focal Pertinent Labs None available  health maintenance Vaccination  Vaccine Date last given comment  Influenza    Hepatitis B    Hepatitis A    Prevnar-PCV-13    Pneumovac-PPSV-23    TdaP    HPV    Shingrix ( zoster vaccine)     ______________________  Labs Lab Result  Date comment  HIV VL      CD4     Genotype     [id number]     HIV antibody     RPR     Quantiferon Gold     Hep C ab     Hepatitis B-ab,ag,c     Hepatitis A-IgM, IgG /T     Lipid     GC/CHL     PAP     HB,PLT,Cr, LFT       Preventive  Procedure Result  Date comment  colonoscopy     Mammogram     Dental exam     Opthal       Impression/Recommendation ? HIV disease on Complera.  Viral load undetectable and CD4 count more than thousand.  There was a question whether she was prescribed dolutegravir.  She does not need to take any dolutegravir.  And I do not see any records of it being given in the past as well.    Behavioral disorder, adjustment disorder,  mild intellectual disability. On , Vistaril, aripiprazole and buspirone Need to get her updated medication list.  Her caretaker will fax that information.  Hyperlipidemia on atorvastatin ? On medroxyprogesterone for contraception Her caretaker was wondering whether there was any HIV classes she could attend.  We will check with our CAD.  Discussed the management with the patient and the caregiver. Caregiver will bring the vaccination records Follow-up 4 months.Marland Kitchen

## 2021-02-25 NOTE — Patient Instructions (Signed)
You are here for follow up of HIV- your last Vl < 20 and Cd4 > 1000. Continue complera- you dont need to take tivicay. Stop Ibuprofen- discuss with your PCP regarding alternate for headache. Follow up 4 months

## 2021-03-07 ENCOUNTER — Other Ambulatory Visit: Payer: Self-pay

## 2021-03-07 ENCOUNTER — Emergency Department
Admission: EM | Admit: 2021-03-07 | Discharge: 2021-03-08 | Disposition: A | Discharge status: Home / Self Care | Payer: Medicaid Other | Attending: Student in an Organized Health Care Education/Training Program | Admitting: Student in an Organized Health Care Education/Training Program

## 2021-03-07 ENCOUNTER — Encounter: Payer: Self-pay | Admitting: Emergency Medicine

## 2021-03-07 DIAGNOSIS — F4325 Adjustment disorder with mixed disturbance of emotions and conduct: Secondary | ICD-10-CM | POA: Diagnosis present

## 2021-03-07 DIAGNOSIS — R45850 Homicidal ideations: Secondary | ICD-10-CM | POA: Insufficient documentation

## 2021-03-07 DIAGNOSIS — F17210 Nicotine dependence, cigarettes, uncomplicated: Secondary | ICD-10-CM | POA: Insufficient documentation

## 2021-03-07 DIAGNOSIS — F70 Mild intellectual disabilities: Secondary | ICD-10-CM | POA: Diagnosis present

## 2021-03-07 DIAGNOSIS — F329 Major depressive disorder, single episode, unspecified: Secondary | ICD-10-CM | POA: Diagnosis not present

## 2021-03-07 DIAGNOSIS — Z21 Asymptomatic human immunodeficiency virus [HIV] infection status: Secondary | ICD-10-CM | POA: Insufficient documentation

## 2021-03-07 DIAGNOSIS — R45851 Suicidal ideations: Secondary | ICD-10-CM | POA: Diagnosis not present

## 2021-03-07 DIAGNOSIS — Z20822 Contact with and (suspected) exposure to covid-19: Secondary | ICD-10-CM | POA: Diagnosis not present

## 2021-03-07 DIAGNOSIS — F32A Depression, unspecified: Secondary | ICD-10-CM | POA: Diagnosis not present

## 2021-03-07 DIAGNOSIS — Y900 Blood alcohol level of less than 20 mg/100 ml: Secondary | ICD-10-CM | POA: Insufficient documentation

## 2021-03-07 DIAGNOSIS — Z85828 Personal history of other malignant neoplasm of skin: Secondary | ICD-10-CM | POA: Insufficient documentation

## 2021-03-07 DIAGNOSIS — Z046 Encounter for general psychiatric examination, requested by authority: Secondary | ICD-10-CM | POA: Insufficient documentation

## 2021-03-07 LAB — COMPREHENSIVE METABOLIC PANEL
ALT: 16 U/L (ref 0–44)
AST: 17 U/L (ref 15–41)
Albumin: 4.4 g/dL (ref 3.5–5.0)
Alkaline Phosphatase: 84 U/L (ref 38–126)
Anion gap: 4 — ABNORMAL LOW (ref 5–15)
BUN: 11 mg/dL (ref 6–20)
CO2: 26 mmol/L (ref 22–32)
Calcium: 9.1 mg/dL (ref 8.9–10.3)
Chloride: 106 mmol/L (ref 98–111)
Creatinine, Ser: 1.03 mg/dL — ABNORMAL HIGH (ref 0.44–1.00)
GFR, Estimated: >60 mL/min (ref >60)
Glucose, Bld: 108 mg/dL — ABNORMAL HIGH (ref 70–99)
Potassium: 3.7 mmol/L (ref 3.5–5.1)
Sodium: 136 mmol/L (ref 135–145)
Total Bilirubin: 0.4 mg/dL (ref 0.3–1.2)
Total Protein: 7.6 g/dL (ref 6.5–8.1)

## 2021-03-07 LAB — URINE DRUG SCREEN, QUALITATIVE (ARMC ONLY)
Amphetamines, Ur Screen: NOT DETECTED
Barbiturates, Ur Screen: NOT DETECTED
Benzodiazepine, Ur Scrn: NOT DETECTED
Cannabinoid 50 Ng, Ur ~~LOC~~: NOT DETECTED
Cocaine Metabolite,Ur ~~LOC~~: NOT DETECTED
MDMA (Ecstasy)Ur Screen: NOT DETECTED
Methadone Scn, Ur: NOT DETECTED
Opiate, Ur Screen: NOT DETECTED
Phencyclidine (PCP) Ur S: NOT DETECTED
Tricyclic, Ur Screen: NOT DETECTED

## 2021-03-07 LAB — CBC
HCT: 37.1 % (ref 36.0–46.0)
Hemoglobin: 12.8 g/dL (ref 12.0–15.0)
MCH: 31.4 pg (ref 26.0–34.0)
MCHC: 34.5 g/dL (ref 30.0–36.0)
MCV: 91.2 fL (ref 80.0–100.0)
Platelets: 236 10*3/uL (ref 150–400)
RBC: 4.07 MIL/uL (ref 3.87–5.11)
RDW: 12.8 % (ref 11.5–15.5)
WBC: 9.4 10*3/uL (ref 4.0–10.5)
nRBC: 0 % (ref 0.0–0.2)

## 2021-03-07 LAB — ACETAMINOPHEN LEVEL: Acetaminophen (Tylenol), Serum: <10 ug/mL — ABNORMAL LOW (ref 10–30)

## 2021-03-07 LAB — ETHANOL: Alcohol, Ethyl (B): <10 mg/dL (ref <10)

## 2021-03-07 LAB — RESP PANEL BY RT-PCR (FLU A&B, COVID) ARPGX2
Influenza A by PCR: NEGATIVE
Influenza B by PCR: NEGATIVE
SARS Coronavirus 2 by RT PCR: NEGATIVE

## 2021-03-07 LAB — PREGNANCY, URINE: Preg Test, Ur: NEGATIVE

## 2021-03-07 LAB — SALICYLATE LEVEL: Salicylate Lvl: <7 mg/dL — ABNORMAL LOW (ref 7.0–30.0)

## 2021-03-07 NOTE — ED Notes (Signed)
Patient moved to [facility].  Oriented to unit with cameras and rounding.  Patient verbalized understanding.
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
## 2021-03-07 NOTE — ED Notes (Signed)
Hourly rounding performed, patient currently awake in room. Patient has no complaints at this time. Q15 minute rounds and monitoring via Rover and Officer to continue. 

## 2021-03-07 NOTE — ED Notes (Signed)
Report received from [place], Conservation officer, nature. Patient alert and oriented, warm and dry, and in no acute distress. Patient denies AVH and pain. Patient made aware of Q15 minute rounds and Engineer, drilling presence for their safety. Patient instructed to come to this nurse with needs or concerns.
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
## 2021-03-07 NOTE — ED Notes (Signed)
IVC pending consult   

## 2021-03-07 NOTE — ED Triage Notes (Signed)
Pt reports that she does not want to talk to Dr. Carlena Hurl
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
## 2021-03-08 DIAGNOSIS — F32A Depression, unspecified: Secondary | ICD-10-CM | POA: Insufficient documentation

## 2021-03-08 DIAGNOSIS — F70 Mild intellectual disabilities: Secondary | ICD-10-CM

## 2021-03-08 DIAGNOSIS — F4325 Adjustment disorder with mixed disturbance of emotions and conduct: Secondary | ICD-10-CM

## 2021-03-08 DIAGNOSIS — F39 Unspecified mood [affective] disorder: Secondary | ICD-10-CM | POA: Insufficient documentation

## 2021-03-08 NOTE — BH Assessment (Signed)
Writer received return phone call from patient's guardian ([name] [phone]). She's in agreement with the plan of the patient returning to the [place]. She also reports of having no safety concerns. Updated Psych NP (Jamie L.).  Writer called and left a HIPPA Compliant message with [place], [place] [phone]), requesting a return phone call. Writer updated patient's nurse Tomasa Hosteller).

## 2021-03-08 NOTE — ED Notes (Signed)
Patient provided clean clothes, shower and oral hygiene supplies. Patient showered early this morning, changed into clean clothes and had breakfast.
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
## 2021-03-08 NOTE — ED Notes (Signed)
IVC/pending reassessment in AM.

## 2021-03-08 NOTE — ED Notes (Signed)
Guardian called and made aware patient has been discharged back to group home. As per guardian will be available to pick patient up at 1330
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
## 2021-03-08 NOTE — Discharge Instructions (Signed)
Follow up with outpatient provder

## 2021-03-08 NOTE — ED Notes (Signed)
Patient discharge to the care of group home personnel. Patient was very hesitant in leaving, patient was advised that her guardian is aware she will return back to group home today.

## 2021-04-30 ENCOUNTER — Emergency Department
Admission: EM | Admit: 2021-04-30 | Discharge: 2021-04-30 | Disposition: A | Discharge status: Nursing Home (Includes ICF) | Payer: Medicaid Other | Attending: Emergency Medicine | Admitting: Emergency Medicine

## 2021-04-30 ENCOUNTER — Other Ambulatory Visit: Payer: Self-pay

## 2021-04-30 ENCOUNTER — Encounter: Payer: Self-pay | Admitting: Emergency Medicine

## 2021-04-30 DIAGNOSIS — B20 Human immunodeficiency virus [HIV] disease: Secondary | ICD-10-CM | POA: Diagnosis present

## 2021-04-30 DIAGNOSIS — S50812A Abrasion of left forearm, initial encounter: Secondary | ICD-10-CM | POA: Insufficient documentation

## 2021-04-30 DIAGNOSIS — Z85828 Personal history of other malignant neoplasm of skin: Secondary | ICD-10-CM | POA: Diagnosis not present

## 2021-04-30 DIAGNOSIS — S50811A Abrasion of right forearm, initial encounter: Secondary | ICD-10-CM | POA: Diagnosis not present

## 2021-04-30 DIAGNOSIS — Z21 Asymptomatic human immunodeficiency virus [HIV] infection status: Secondary | ICD-10-CM | POA: Diagnosis present

## 2021-04-30 DIAGNOSIS — S4991XA Unspecified injury of right shoulder and upper arm, initial encounter: Secondary | ICD-10-CM | POA: Diagnosis present

## 2021-04-30 DIAGNOSIS — F17210 Nicotine dependence, cigarettes, uncomplicated: Secondary | ICD-10-CM | POA: Insufficient documentation

## 2021-04-30 DIAGNOSIS — Z23 Encounter for immunization: Secondary | ICD-10-CM | POA: Diagnosis not present

## 2021-04-30 DIAGNOSIS — F32A Depression, unspecified: Secondary | ICD-10-CM | POA: Insufficient documentation

## 2021-04-30 DIAGNOSIS — Y9289 Other specified places as the place of occurrence of the external cause: Secondary | ICD-10-CM | POA: Diagnosis not present

## 2021-04-30 DIAGNOSIS — F4325 Adjustment disorder with mixed disturbance of emotions and conduct: Secondary | ICD-10-CM | POA: Diagnosis not present

## 2021-04-30 DIAGNOSIS — F70 Mild intellectual disabilities: Secondary | ICD-10-CM | POA: Diagnosis not present

## 2021-04-30 DIAGNOSIS — R45851 Suicidal ideations: Secondary | ICD-10-CM | POA: Insufficient documentation

## 2021-04-30 LAB — SALICYLATE LEVEL: Salicylate Lvl: <7 mg/dL — ABNORMAL LOW (ref 7.0–30.0)

## 2021-04-30 LAB — URINE DRUG SCREEN, QUALITATIVE (ARMC ONLY)
Amphetamines, Ur Screen: NOT DETECTED
Barbiturates, Ur Screen: NOT DETECTED
Benzodiazepine, Ur Scrn: NOT DETECTED
Cannabinoid 50 Ng, Ur ~~LOC~~: NOT DETECTED
Cocaine Metabolite,Ur ~~LOC~~: NOT DETECTED
MDMA (Ecstasy)Ur Screen: NOT DETECTED
Methadone Scn, Ur: NOT DETECTED
Opiate, Ur Screen: NOT DETECTED
Phencyclidine (PCP) Ur S: NOT DETECTED
Tricyclic, Ur Screen: POSITIVE — AB

## 2021-04-30 LAB — COMPREHENSIVE METABOLIC PANEL
ALT: 19 U/L (ref 0–44)
AST: 17 U/L (ref 15–41)
Albumin: 4.9 g/dL (ref 3.5–5.0)
Alkaline Phosphatase: 91 U/L (ref 38–126)
Anion gap: 9 (ref 5–15)
BUN: 14 mg/dL (ref 6–20)
CO2: 21 mmol/L — ABNORMAL LOW (ref 22–32)
Calcium: 9.2 mg/dL (ref 8.9–10.3)
Chloride: 106 mmol/L (ref 98–111)
Creatinine, Ser: 1.33 mg/dL — ABNORMAL HIGH (ref 0.44–1.00)
GFR, Estimated: 55 mL/min — ABNORMAL LOW (ref >60)
Glucose, Bld: 155 mg/dL — ABNORMAL HIGH (ref 70–99)
Potassium: 3.2 mmol/L — ABNORMAL LOW (ref 3.5–5.1)
Sodium: 136 mmol/L (ref 135–145)
Total Bilirubin: 0.7 mg/dL (ref 0.3–1.2)
Total Protein: 8.1 g/dL (ref 6.5–8.1)

## 2021-04-30 LAB — CBC
HCT: 41.4 % (ref 36.0–46.0)
Hemoglobin: 14.1 g/dL (ref 12.0–15.0)
MCH: 31.1 pg (ref 26.0–34.0)
MCHC: 34.1 g/dL (ref 30.0–36.0)
MCV: 91.4 fL (ref 80.0–100.0)
Platelets: 236 10*3/uL (ref 150–400)
RBC: 4.53 MIL/uL (ref 3.87–5.11)
RDW: 12.1 % (ref 11.5–15.5)
WBC: 11 10*3/uL — ABNORMAL HIGH (ref 4.0–10.5)
nRBC: 0 % (ref 0.0–0.2)

## 2021-04-30 LAB — ACETAMINOPHEN LEVEL: Acetaminophen (Tylenol), Serum: <10 ug/mL — ABNORMAL LOW (ref 10–30)

## 2021-04-30 LAB — ETHANOL: Alcohol, Ethyl (B): <10 mg/dL (ref <10)

## 2021-04-30 MED ORDER — TETANUS-DIPHTH-ACELL PERTUSSIS 5-2.5-18.5 LF-MCG/0.5 IM SUSY
0.5000 mL | PREFILLED_SYRINGE | Freq: Once | INTRAMUSCULAR | Status: AC
  Administered 2021-04-30: 0.5 mL via INTRAMUSCULAR

## 2021-04-30 MED ORDER — TETANUS-DIPHTHERIA TOXOIDS TD 5-2 LFU IM INJ
0.5000 mL | INJECTION | Freq: Once | INTRAMUSCULAR | Status: DC
  Filled 2021-04-30: qty 0.5

## 2021-04-30 NOTE — ED Notes (Signed)
Attempted to call legal guardian for patient, immediately went to voicemail. Will try again.

## 2021-04-30 NOTE — ED Notes (Signed)
Pt dressed out by Terrence Dupont, NT. Pt belongings: Black and white dress Black leggings  1 black shoe  Blue bra

## 2021-04-30 NOTE — ED Notes (Signed)
VOL  CONSULT  DONE   

## 2021-04-30 NOTE — ED Notes (Signed)
Pt given dinner tray.
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
## 2021-04-30 NOTE — ED Notes (Signed)
Doyce Para, [facility] here to provide transportation back to group home.  Belongings returned to patient at this time; patient able to dress self, escorted out per EDT, John.
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
## 2021-04-30 NOTE — ED Notes (Signed)
Unable to get in touch with group home, left message.
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
## 2021-04-30 NOTE — ED Notes (Addendum)
This RN spoke with Deborra Medina of [facility]; notified of plan to discharge.  States she will arrange for transportation with an approximate ETA of 1900.

## 2021-04-30 NOTE — ED Notes (Signed)
This RN reached patients legal guardian, Crystal [name] via telephone who confirms that discharge back to group home is a safe discharge plan, expressing verbal agreement in doing so.

## 2021-05-30 ENCOUNTER — Other Ambulatory Visit: Payer: Self-pay

## 2021-05-30 ENCOUNTER — Emergency Department
Admission: EM | Admit: 2021-05-30 | Discharge: 2021-05-31 | Disposition: A | Discharge status: Nursing Home (Includes ICF) | Payer: Medicaid Other | Attending: Emergency Medicine | Admitting: Emergency Medicine

## 2021-05-30 DIAGNOSIS — F840 Autistic disorder: Secondary | ICD-10-CM | POA: Diagnosis not present

## 2021-05-30 DIAGNOSIS — F4325 Adjustment disorder with mixed disturbance of emotions and conduct: Secondary | ICD-10-CM | POA: Insufficient documentation

## 2021-05-30 DIAGNOSIS — Z046 Encounter for general psychiatric examination, requested by authority: Secondary | ICD-10-CM | POA: Diagnosis present

## 2021-05-30 DIAGNOSIS — Z79899 Other long term (current) drug therapy: Secondary | ICD-10-CM | POA: Diagnosis not present

## 2021-05-30 DIAGNOSIS — Z21 Asymptomatic human immunodeficiency virus [HIV] infection status: Secondary | ICD-10-CM | POA: Insufficient documentation

## 2021-05-30 DIAGNOSIS — R45851 Suicidal ideations: Secondary | ICD-10-CM | POA: Insufficient documentation

## 2021-05-30 DIAGNOSIS — Z20822 Contact with and (suspected) exposure to covid-19: Secondary | ICD-10-CM | POA: Diagnosis not present

## 2021-05-30 DIAGNOSIS — F17210 Nicotine dependence, cigarettes, uncomplicated: Secondary | ICD-10-CM | POA: Diagnosis not present

## 2021-05-30 DIAGNOSIS — Z85828 Personal history of other malignant neoplasm of skin: Secondary | ICD-10-CM | POA: Diagnosis not present

## 2021-05-30 LAB — URINE DRUG SCREEN, QUALITATIVE (ARMC ONLY)
Amphetamines, Ur Screen: NOT DETECTED
Barbiturates, Ur Screen: NOT DETECTED
Benzodiazepine, Ur Scrn: NOT DETECTED
Cannabinoid 50 Ng, Ur ~~LOC~~: NOT DETECTED
Cocaine Metabolite,Ur ~~LOC~~: NOT DETECTED
MDMA (Ecstasy)Ur Screen: NOT DETECTED
Methadone Scn, Ur: NOT DETECTED
Opiate, Ur Screen: NOT DETECTED
Phencyclidine (PCP) Ur S: NOT DETECTED
Tricyclic, Ur Screen: NOT DETECTED

## 2021-05-30 LAB — CBC
HCT: 37.8 % (ref 36.0–46.0)
Hemoglobin: 13 g/dL (ref 12.0–15.0)
MCH: 31.6 pg (ref 26.0–34.0)
MCHC: 34.4 g/dL (ref 30.0–36.0)
MCV: 92 fL (ref 80.0–100.0)
Platelets: 226 10*3/uL (ref 150–400)
RBC: 4.11 MIL/uL (ref 3.87–5.11)
RDW: 12.5 % (ref 11.5–15.5)
WBC: 11 10*3/uL — ABNORMAL HIGH (ref 4.0–10.5)
nRBC: 0 % (ref 0.0–0.2)

## 2021-05-30 LAB — COMPREHENSIVE METABOLIC PANEL
ALT: 17 U/L (ref 0–44)
AST: 17 U/L (ref 15–41)
Albumin: 4.4 g/dL (ref 3.5–5.0)
Alkaline Phosphatase: 88 U/L (ref 38–126)
Anion gap: 7 (ref 5–15)
BUN: 13 mg/dL (ref 6–20)
CO2: 24 mmol/L (ref 22–32)
Calcium: 9.5 mg/dL (ref 8.9–10.3)
Chloride: 106 mmol/L (ref 98–111)
Creatinine, Ser: 1.11 mg/dL — ABNORMAL HIGH (ref 0.44–1.00)
GFR, Estimated: >60 mL/min (ref >60)
Glucose, Bld: 109 mg/dL — ABNORMAL HIGH (ref 70–99)
Potassium: 3.4 mmol/L — ABNORMAL LOW (ref 3.5–5.1)
Sodium: 137 mmol/L (ref 135–145)
Total Bilirubin: 0.5 mg/dL (ref 0.3–1.2)
Total Protein: 7.5 g/dL (ref 6.5–8.1)

## 2021-05-30 LAB — POC URINE PREG, ED: Preg Test, Ur: NEGATIVE

## 2021-05-30 LAB — ACETAMINOPHEN LEVEL: Acetaminophen (Tylenol), Serum: <10 ug/mL — ABNORMAL LOW (ref 10–30)

## 2021-05-30 LAB — SALICYLATE LEVEL: Salicylate Lvl: <7 mg/dL — ABNORMAL LOW (ref 7.0–30.0)

## 2021-05-30 LAB — ETHANOL: Alcohol, Ethyl (B): <10 mg/dL (ref <10)

## 2021-05-30 NOTE — ED Provider Notes (Signed)
[facility] Emergency Department Provider Note  ____________________________________________  Time seen: Approximately 10:20 PM  I have reviewed the triage vital signs and the nursing notes.   HISTORY  Chief Complaint IVC    Level 5 Caveat: Portions of the History and Physical including HPI and review of systems are unable to be completely obtained due to patient being a poor historian   HPI Crystal [name] is a 30 y.o. female with a history of HIV and depression who is sent to the ED under involuntary commitment by [facility] due to suicidal thoughts with a plan to cut her throat and ingest bleach.  She also feels aggressive toward other people at her group home, wanting to cut them as well.  Reports that she knows where she can access sharps and bleach.  She has a history of self-injurious behavior.  No current injuries, no pain or acute medical complaints.    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 Authorizing Provider  ARIPiprazole (ABILIFY) 20 MG tablet Take 1 tablet (20 mg total) by mouth daily. 11/14/20   Salley Scarlet, MD  atorvastatin (LIPITOR) 20 MG tablet Take 20 mg by mouth daily.    [provider]  busPIRone (BUSPAR) 30 MG tablet Take 1 tablet (30 mg total) by mouth 2 (two) times daily. 11/14/20   Salley Scarlet, MD  chlorhexidine (PERIDEX) 0.12 % solution Use as directed 15 mLs in the mouth  or throat 2 (two) times daily. 11/14/20   Salley Scarlet, MD  Cholecalciferol (VITAMIN D3) 1.25 MG (50000 UT) TABS Take by mouth.    [provider]  diphenhydrAMINE (BENADRYL) 2 % cream Apply 1 application topically 2 (two) times daily as needed for itching. (apply to feet) 11/14/20   Salley Scarlet, MD  docusate sodium (COLACE) 100 MG capsule Take 1 capsule (100 mg total) by mouth 2 (two) times daily. 11/14/20   Salley Scarlet, MD  emtricitabine-rilpivir-tenofovir DF (COMPLERA) 200-25-300 MG tablet Take 1 tablet by mouth daily. 11/14/20   Salley Scarlet, MD  hydrOXYzine (VISTARIL) 100 MG capsule Take 1 capsule (100 mg total) by mouth at bedtime. 11/14/20   Salley Scarlet, MD  ibuprofen (ADVIL) 600 MG tablet Take 1 tablet (600 mg total) by mouth 2 (two) times daily. 11/14/20   Salley Scarlet, MD  medroxyPROGESTERone (DEPO-PROVERA) 150 MG/ML injection Inject 150 mg into the muscle every 3 (three) months.    [provider]  melatonin 3 MG TABS tablet Take 2 tablets (6 mg total) by mouth at bedtime. 11/14/20   Salley Scarlet, MD  sertraline (ZOLOFT) 100 MG tablet Take 1.5 tablets (150 mg total) by mouth daily. 11/14/20   Salley Scarlet, MD  vitamin B-12 (CYANOCOBALAMIN) 1000 MCG tablet Take 1,000 mcg by mouth daily.    [provider]     Allergies Patient has no known  allergies.   No family history on file.  Social History Social History   Tobacco Use   Smoking status: Every Day    Packs/day: 0.50    Years: 12.00    Pack years: 6.00    Types: Cigarettes   Smokeless tobacco: Never  Vaping Use   Vaping Use: Never used  Substance Use Topics   Alcohol use: Not Currently   Drug use: Not Currently    Review of Systems Level 5 Caveat: Portions of the History and Physical including HPI and review of systems are unable to be completely obtained due to patient being a poor historian   Constitutional:   No known fever.  ENT:   No  rhinorrhea. Cardiovascular:   No chest pain or syncope. Respiratory:   No dyspnea or cough. Gastrointestinal:   Negative for abdominal pain, vomiting and diarrhea.  Musculoskeletal:   Negative for focal pain or swelling ____________________________________________   PHYSICAL EXAM:  VITAL SIGNS: ED Triage Vitals  Enc Vitals Group     BP 05/30/21 2111 112/76     Pulse Rate 05/30/21 2111 74     Resp 05/30/21 2111 14     Temp 05/30/21 2111 98.1 F (36.7 C)     Temp Source 05/30/21 2111 Oral     SpO2 05/30/21 2111 98 %     Weight 05/30/21 2112 160 lb (72.6 kg)     Height 05/30/21 2112 '5\' 1"'$  (1.549 m)     Head Circumference --      Peak Flow --      Pain Score 05/30/21 2112 0     Pain Loc --      Pain Edu? --      Excl. in [place]? --     Vital signs reviewed, nursing assessments reviewed.   Constitutional:   Alert and oriented. Non-toxic appearance. Eyes:   Conjunctivae are normal. EOMI. PERRL. ENT      Head:   Normocephalic and atraumatic.      Nose:   No congestion/rhinnorhea.       Mouth/Throat:   MMM, no pharyngeal erythema. No peritonsillar mass.       Neck:   No meningismus. Full ROM. Hematological/Lymphatic/Immunilogical:   No cervical lymphadenopathy. Cardiovascular:   RRR. Symmetric bilateral radial and DP pulses.  No murmurs. Cap refill less than 2 seconds. Respiratory:   Normal respiratory effort without tachypnea/retractions. Breath sounds are clear and equal bilaterally. No wheezes/rales/rhonchi. Musculoskeletal:   Normal range of motion in all extremities. No joint effusions.  No lower extremity tenderness.  No edema. Neurologic:   Normal speech and language.  Motor grossly intact. No acute focal neurologic deficits are appreciated.  Skin:    Skin is warm, dry and intact. No rash noted.  No petechiae, purpura, or bullae.  ____________________________________________    LABS (pertinent positives/negatives) (all labs ordered are listed, but only abnormal  results are displayed) Labs Reviewed  COMPREHENSIVE METABOLIC PANEL - Abnormal; Notable for the following components:      Result Value   Potassium 3.4 (*)    Glucose, Bld 109 (*)    Creatinine, Ser 1.11 (*)    All other components within normal limits  SALICYLATE LEVEL - Abnormal; Notable for the following components:   Salicylate Lvl [id number] (*)    All other components within normal limits  ACETAMINOPHEN LEVEL - Abnormal; Notable for the following components:   Acetaminophen (Tylenol), Serum <10 (*)    All other components within normal limits  CBC -  Abnormal; Notable for the following components:   WBC 11.0 (*)    All other components within normal limits  RESP PANEL BY RT-PCR (FLU A&B, COVID) ARPGX2  ETHANOL  URINE DRUG SCREEN, QUALITATIVE ([facility] ONLY)  POC URINE PREG, ED   ____________________________________________   EKG    ____________________________________________    RADIOLOGY  No results found.  ____________________________________________   PROCEDURES Procedures  ____________________________________________    CLINICAL IMPRESSION / ASSESSMENT AND PLAN / ED COURSE  Medications ordered in the ED: Medications - No data to display  Pertinent labs & imaging results that were available during my care of the patient were reviewed by me and considered in my medical decision making (see chart for details).   Jaeya Regel was evaluated in Emergency Department on 05/30/2021 for the symptoms described in the history of present illness. She was evaluated in the context of the global COVID-19 pandemic, which necessitated consideration that the patient might be at risk for infection with the SARS-CoV-2 virus that causes COVID-19. Institutional protocols and algorithms that pertain to the evaluation of patients at risk for COVID-19 are in a state of rapid change based on information released by regulatory bodies including the CDC and federal and state organizations.  These policies and algorithms were followed during the patient's care in the ED.   Patient presents under IVC due to SI and agitation.  We will continue IVC for now pending psychiatry evaluation.  She is medically stable.  The patient has been placed in psychiatric observation due to the need to provide a safe environment for the patient while obtaining psychiatric consultation and evaluation, as well as ongoing medical and medication management to treat the patient's condition.  The patient has been placed under full IVC at this time.       ____________________________________________   FINAL CLINICAL IMPRESSION(S) / ED DIAGNOSES    Final diagnoses:  Suicidal ideation     ED Discharge Orders     None       Portions of this note were generated with dragon dictation software. Dictation errors may occur despite best attempts at proofreading.   Carrie Mew, MD 05/30/21 2221
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
## 2021-05-30 NOTE — ED Notes (Signed)
Pt dressed out in burgundy scrubs with myself and Hailey present. Pt's belongings include green earrings,white necklace, yellow necklace, socks, sneakers, camo pants, green shirt, blue panties, and bra. Pt has glasses with her, denies money, wallet, or cell phone. Items bagged, labeled, and placed at nurses station for lock up. Pt ambulated to [facility] with officer escort.

## 2021-05-31 DIAGNOSIS — R4589 Other symptoms and signs involving emotional state: Secondary | ICD-10-CM | POA: Insufficient documentation

## 2021-05-31 DIAGNOSIS — F840 Autistic disorder: Secondary | ICD-10-CM

## 2021-05-31 DIAGNOSIS — R45851 Suicidal ideations: Secondary | ICD-10-CM | POA: Insufficient documentation

## 2021-05-31 LAB — RESP PANEL BY RT-PCR (FLU A&B, COVID) ARPGX2
Influenza A by PCR: NEGATIVE
Influenza B by PCR: NEGATIVE
SARS Coronavirus 2 by RT PCR: NEGATIVE

## 2021-05-31 NOTE — ED Notes (Signed)
Pt [place], [employer] [phone], notified of pt discharge at [place].  Pt facility notified @ [phone], facility stated they would send private transport.  Pt in ED stated "I don't want to go back there, I want to be inpatient, can't you just send me to another hospital."  Pt has been advised that she will be returning to her group home and for any additional questions she can contact her legal guardian, phone number has been provided.

## 2021-05-31 NOTE — ED Notes (Signed)
Patient has been cleared for psych. Attempts to group home and legal guardian made (no answer) to inform them that patient is able to return home. MD made aware. Patient resting comfortably in bed. Will continue to monitor.

## 2021-05-31 NOTE — Discharge Instructions (Signed)
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
## 2021-07-01 ENCOUNTER — Other Ambulatory Visit
Admission: RE | Admit: 2021-07-01 | Discharge: 2021-07-01 | Disposition: A | Discharge status: Home / Self Care | Payer: Medicaid Other | Attending: Infectious Diseases | Admitting: Infectious Diseases

## 2021-07-01 ENCOUNTER — Ambulatory Visit: Payer: Medicaid Other | Attending: Infectious Diseases | Admitting: Infectious Diseases

## 2021-07-01 ENCOUNTER — Other Ambulatory Visit: Payer: Self-pay

## 2021-07-01 VITALS — BP 103/77 | HR 86 | Resp 16 | Ht 61.0 in | Wt 160.0 lb

## 2021-07-01 DIAGNOSIS — Z21 Asymptomatic human immunodeficiency virus [HIV] infection status: Secondary | ICD-10-CM | POA: Insufficient documentation

## 2021-07-01 DIAGNOSIS — K5900 Constipation, unspecified: Secondary | ICD-10-CM | POA: Diagnosis not present

## 2021-07-01 DIAGNOSIS — E785 Hyperlipidemia, unspecified: Secondary | ICD-10-CM | POA: Diagnosis not present

## 2021-07-01 DIAGNOSIS — F17210 Nicotine dependence, cigarettes, uncomplicated: Secondary | ICD-10-CM | POA: Diagnosis not present

## 2021-07-01 DIAGNOSIS — B20 Human immunodeficiency virus [HIV] disease: Secondary | ICD-10-CM | POA: Diagnosis not present

## 2021-07-01 DIAGNOSIS — Z79899 Other long term (current) drug therapy: Secondary | ICD-10-CM | POA: Insufficient documentation

## 2021-07-01 DIAGNOSIS — Z793 Long term (current) use of hormonal contraceptives: Secondary | ICD-10-CM | POA: Insufficient documentation

## 2021-07-01 DIAGNOSIS — F70 Mild intellectual disabilities: Secondary | ICD-10-CM | POA: Diagnosis not present

## 2021-07-01 DIAGNOSIS — F4320 Adjustment disorder, unspecified: Secondary | ICD-10-CM | POA: Diagnosis not present

## 2021-07-01 LAB — COMPREHENSIVE METABOLIC PANEL
ALT: 16 U/L (ref 0–44)
AST: 15 U/L (ref 15–41)
Albumin: 4.2 g/dL (ref 3.5–5.0)
Alkaline Phosphatase: 84 U/L (ref 38–126)
Anion gap: 6 (ref 5–15)
BUN: 12 mg/dL (ref 6–20)
CO2: 24 mmol/L (ref 22–32)
Calcium: 9.1 mg/dL (ref 8.9–10.3)
Chloride: 108 mmol/L (ref 98–111)
Creatinine, Ser: 1.32 mg/dL — ABNORMAL HIGH (ref 0.44–1.00)
GFR, Estimated: 56 mL/min — ABNORMAL LOW (ref >60)
Glucose, Bld: 100 mg/dL — ABNORMAL HIGH (ref 70–99)
Potassium: 4 mmol/L (ref 3.5–5.1)
Sodium: 138 mmol/L (ref 135–145)
Total Bilirubin: 0.4 mg/dL (ref 0.3–1.2)
Total Protein: 7.5 g/dL (ref 6.5–8.1)

## 2021-07-01 LAB — CBC WITH DIFFERENTIAL/PLATELET
Abs Immature Granulocytes: 0.04 10*3/uL (ref 0.00–0.07)
Basophils Absolute: 0 10*3/uL (ref 0.0–0.1)
Basophils Relative: 1 %
Eosinophils Absolute: 0.1 10*3/uL (ref 0.0–0.5)
Eosinophils Relative: 1 %
HCT: 38 % (ref 36.0–46.0)
Hemoglobin: 13.2 g/dL (ref 12.0–15.0)
Immature Granulocytes: 1 %
Lymphocytes Relative: 25 %
Lymphs Abs: 2 10*3/uL (ref 0.7–4.0)
MCH: 31.4 pg (ref 26.0–34.0)
MCHC: 34.7 g/dL (ref 30.0–36.0)
MCV: 90.3 fL (ref 80.0–100.0)
Monocytes Absolute: 0.6 10*3/uL (ref 0.1–1.0)
Monocytes Relative: 7 %
Neutro Abs: 5.2 10*3/uL (ref 1.7–7.7)
Neutrophils Relative %: 65 %
Platelets: 237 10*3/uL (ref 150–400)
RBC: 4.21 MIL/uL (ref 3.87–5.11)
RDW: 12.7 % (ref 11.5–15.5)
WBC: 7.9 10*3/uL (ref 4.0–10.5)
nRBC: 0 % (ref 0.0–0.2)
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
## 2021-07-01 NOTE — Patient Instructions (Signed)
You are here for follow-up.  Today we will do labs.  CBC, CMP and labs  immune status.  You have only been taking Complera since the time I saw you since March 2022.  The dolutegravir is in your medication list but you have not been taking it for at least the last 6 months so I do not see a reason why you should start taking it now. Your last viral load was undetectable and your T-cell count was more than thousand.  So you are well controlled with 1 medication which is Complera.  Follow-up in 6 months.

## 2021-07-02 LAB — T-HELPER CELLS CD4/CD8 %
% CD 4 Pos. Lymph.: 51.9 % (ref 30.8–58.5)
Absolute CD 4 Helper: 1038 /uL (ref 359–1519)
Basophils Absolute: 0 10*3/uL (ref 0.0–0.2)
Basos: 1 %
CD3+CD4+ Cells/CD3+CD8+ Cells Bld: 1.87 (ref 0.92–3.72)
CD3+CD8+ Cells # Bld: 554 /uL (ref 109–897)
CD3+CD8+ Cells NFr Bld: 27.7 % (ref 12.0–35.5)
EOS (ABSOLUTE): 0.1 10*3/uL (ref 0.0–0.4)
Eos: 1 %
Hematocrit: 39.5 % (ref 34.0–46.6)
Hemoglobin: 12.8 g/dL (ref 11.1–15.9)
Immature Grans (Abs): 0 10*3/uL (ref 0.0–0.1)
Immature Granulocytes: 0 %
Lymphocytes Absolute: 2 10*3/uL (ref 0.7–3.1)
Lymphs: 26 %
MCH: 29.4 pg (ref 26.6–33.0)
MCHC: 32.4 g/dL (ref 31.5–35.7)
MCV: 91 fL (ref 79–97)
Monocytes Absolute: 0.6 10*3/uL (ref 0.1–0.9)
Monocytes: 7 %
Neutrophils Absolute: 5.1 10*3/uL (ref 1.4–7.0)
Neutrophils: 65 %
Platelets: 250 10*3/uL (ref 150–450)
RBC: 4.36 x10E6/uL (ref 3.77–5.28)
RDW: 12.2 % (ref 11.7–15.4)
WBC: 7.8 10*3/uL (ref 3.4–10.8)

## 2021-08-25 ENCOUNTER — Other Ambulatory Visit: Payer: Self-pay

## 2021-08-25 ENCOUNTER — Emergency Department
Admission: EM | Admit: 2021-08-25 | Discharge: 2021-08-25 | Disposition: A | Discharge status: Home / Self Care | Payer: Medicaid Other | Attending: Emergency Medicine | Admitting: Emergency Medicine

## 2021-08-25 ENCOUNTER — Encounter: Payer: Self-pay | Admitting: Emergency Medicine

## 2021-08-25 DIAGNOSIS — F70 Mild intellectual disabilities: Secondary | ICD-10-CM | POA: Insufficient documentation

## 2021-08-25 DIAGNOSIS — Z21 Asymptomatic human immunodeficiency virus [HIV] infection status: Secondary | ICD-10-CM | POA: Diagnosis not present

## 2021-08-25 DIAGNOSIS — R45851 Suicidal ideations: Secondary | ICD-10-CM | POA: Insufficient documentation

## 2021-08-25 DIAGNOSIS — Z046 Encounter for general psychiatric examination, requested by authority: Secondary | ICD-10-CM | POA: Diagnosis present

## 2021-08-25 DIAGNOSIS — R451 Restlessness and agitation: Secondary | ICD-10-CM

## 2021-08-25 DIAGNOSIS — S0993XA Unspecified injury of face, initial encounter: Secondary | ICD-10-CM | POA: Insufficient documentation

## 2021-08-25 DIAGNOSIS — F4325 Adjustment disorder with mixed disturbance of emotions and conduct: Secondary | ICD-10-CM | POA: Diagnosis not present

## 2021-08-25 DIAGNOSIS — R45850 Homicidal ideations: Secondary | ICD-10-CM | POA: Diagnosis not present

## 2021-08-25 DIAGNOSIS — F17210 Nicotine dependence, cigarettes, uncomplicated: Secondary | ICD-10-CM | POA: Diagnosis not present

## 2021-08-25 DIAGNOSIS — Z85828 Personal history of other malignant neoplasm of skin: Secondary | ICD-10-CM | POA: Insufficient documentation

## 2021-08-25 LAB — CBC
HCT: 39.1 % (ref 36.0–46.0)
Hemoglobin: 12.5 g/dL (ref 12.0–15.0)
MCH: 29.9 pg (ref 26.0–34.0)
MCHC: 32 g/dL (ref 30.0–36.0)
MCV: 93.5 fL (ref 80.0–100.0)
Platelets: 229 10*3/uL (ref 150–400)
RBC: 4.18 MIL/uL (ref 3.87–5.11)
RDW: 13.2 % (ref 11.5–15.5)
WBC: 10.3 10*3/uL (ref 4.0–10.5)
nRBC: 0 % (ref 0.0–0.2)

## 2021-08-25 LAB — COMPREHENSIVE METABOLIC PANEL
ALT: 16 U/L (ref 0–44)
AST: 14 U/L — ABNORMAL LOW (ref 15–41)
Albumin: 4.1 g/dL (ref 3.5–5.0)
Alkaline Phosphatase: 100 U/L (ref 38–126)
Anion gap: 3 — ABNORMAL LOW (ref 5–15)
BUN: 10 mg/dL (ref 6–20)
CO2: 23 mmol/L (ref 22–32)
Calcium: 8.7 mg/dL — ABNORMAL LOW (ref 8.9–10.3)
Chloride: 110 mmol/L (ref 98–111)
Creatinine, Ser: 1.18 mg/dL — ABNORMAL HIGH (ref 0.44–1.00)
GFR, Estimated: >60 mL/min (ref >60)
Glucose, Bld: 115 mg/dL — ABNORMAL HIGH (ref 70–99)
Potassium: 3.7 mmol/L (ref 3.5–5.1)
Sodium: 136 mmol/L (ref 135–145)
Total Bilirubin: 0.3 mg/dL (ref 0.3–1.2)
Total Protein: 7.2 g/dL (ref 6.5–8.1)

## 2021-08-25 LAB — SALICYLATE LEVEL: Salicylate Lvl: <7 mg/dL — ABNORMAL LOW (ref 7.0–30.0)

## 2021-08-25 LAB — ACETAMINOPHEN LEVEL: Acetaminophen (Tylenol), Serum: <10 ug/mL — ABNORMAL LOW (ref 10–30)

## 2021-08-25 LAB — ETHANOL: Alcohol, Ethyl (B): <10 mg/dL (ref <10)

## 2021-08-25 NOTE — ED Notes (Signed)
RN called for on-call social worker [phone] as directed by voicemail of pt legal guardian.
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
## 2021-08-25 NOTE — ED Notes (Addendum)
[phone], [name] called for update regarding consult with psychiatry.

## 2021-08-25 NOTE — ED Notes (Signed)
Spoke with [employer] with [employer] called and updated on patient being in ER and discharged back to facility.

## 2021-08-25 NOTE — ED Notes (Signed)
[place], Crystal [name] [phone]   [phone]     Listed legal guardian contacted. No answer, left HIPPA compliant VM
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
## 2021-08-25 NOTE — ED Notes (Signed)
VOL, pend psych consult

## 2021-08-25 NOTE — ED Notes (Signed)
Pt resides at [facility]

## 2021-08-25 NOTE — ED Notes (Signed)
Discharged to [name]. Left with all of belongings. Discharged papers reviewed with [name]. Ambulatory.

## 2021-08-25 NOTE — ED Notes (Signed)
1 Black Jacket  1 [employer]  1 Blue flower tank top  1 Tan Bra 1 Brown belt  1 [employer]  1 Pair of black shoes  1 Pair of white socks  1 Blue underwear  1 pair of sliver hoop earrings 1 sliver necklace  1 white necklace  A handful of candy

## 2021-08-25 NOTE — Discharge Instructions (Addendum)
You have been seen in the emergency department for a  psychiatric concern. You have been evaluated both medically as well as psychiatrically. Please follow-up with your outpatient resources provided. Return to the emergency department for any worsening symptoms, or any thoughts of hurting yourself or anyone else so that we may attempt to help you. 

## 2021-08-25 NOTE — ED Notes (Signed)
[name], en route to pick up patient for group home.

## 2021-12-30 ENCOUNTER — Ambulatory Visit: Payer: Medicaid Other | Admitting: Infectious Diseases

## 2021-12-31 ENCOUNTER — Ambulatory Visit (HOSPITAL_COMMUNITY)
Admission: EM | Admit: 2021-12-31 | Discharge: 2021-12-31 | Disposition: A | Discharge status: Home / Self Care | Payer: Medicaid Other | Attending: Psychiatry | Admitting: Psychiatry

## 2021-12-31 DIAGNOSIS — F419 Anxiety disorder, unspecified: Secondary | ICD-10-CM | POA: Insufficient documentation

## 2021-12-31 DIAGNOSIS — R45851 Suicidal ideations: Secondary | ICD-10-CM | POA: Insufficient documentation

## 2021-12-31 DIAGNOSIS — Z79899 Other long term (current) drug therapy: Secondary | ICD-10-CM | POA: Insufficient documentation

## 2021-12-31 DIAGNOSIS — F329 Major depressive disorder, single episode, unspecified: Secondary | ICD-10-CM | POA: Insufficient documentation

## 2021-12-31 DIAGNOSIS — R45850 Homicidal ideations: Secondary | ICD-10-CM | POA: Insufficient documentation

## 2021-12-31 NOTE — Discharge Instructions (Addendum)
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
## 2022-01-08 ENCOUNTER — Other Ambulatory Visit
Admission: RE | Admit: 2022-01-08 | Discharge: 2022-01-08 | Disposition: A | Discharge status: Home / Self Care | Payer: Medicaid Other | Attending: Infectious Diseases | Admitting: Infectious Diseases

## 2022-01-08 ENCOUNTER — Encounter: Payer: Self-pay | Admitting: Infectious Diseases

## 2022-01-08 ENCOUNTER — Ambulatory Visit: Payer: Medicaid Other | Attending: Infectious Diseases | Admitting: Infectious Diseases

## 2022-01-08 VITALS — BP 104/71 | HR 82 | Temp 98.0°F | Wt 159.0 lb

## 2022-01-08 DIAGNOSIS — R634 Abnormal weight loss: Secondary | ICD-10-CM | POA: Insufficient documentation

## 2022-01-08 DIAGNOSIS — F919 Conduct disorder, unspecified: Secondary | ICD-10-CM | POA: Diagnosis not present

## 2022-01-08 DIAGNOSIS — B20 Human immunodeficiency virus [HIV] disease: Secondary | ICD-10-CM | POA: Insufficient documentation

## 2022-01-08 DIAGNOSIS — E785 Hyperlipidemia, unspecified: Secondary | ICD-10-CM | POA: Insufficient documentation

## 2022-01-08 DIAGNOSIS — Z79899 Other long term (current) drug therapy: Secondary | ICD-10-CM | POA: Insufficient documentation

## 2022-01-08 DIAGNOSIS — F70 Mild intellectual disabilities: Secondary | ICD-10-CM | POA: Diagnosis not present

## 2022-01-08 LAB — CK: Total CK: 52 U/L (ref 38–234)

## 2022-01-08 LAB — COMPREHENSIVE METABOLIC PANEL
ALT: 14 U/L (ref 0–44)
AST: 13 U/L — ABNORMAL LOW (ref 15–41)
Albumin: 4.5 g/dL (ref 3.5–5.0)
Alkaline Phosphatase: 85 U/L (ref 38–126)
Anion gap: 3 — ABNORMAL LOW (ref 5–15)
BUN: 12 mg/dL (ref 6–20)
CO2: 22 mmol/L (ref 22–32)
Calcium: 9 mg/dL (ref 8.9–10.3)
Chloride: 111 mmol/L (ref 98–111)
Creatinine, Ser: 1.27 mg/dL — ABNORMAL HIGH (ref 0.44–1.00)
GFR, Estimated: 58 mL/min — ABNORMAL LOW (ref >60)
Glucose, Bld: 92 mg/dL (ref 70–99)
Potassium: 3.9 mmol/L (ref 3.5–5.1)
Sodium: 136 mmol/L (ref 135–145)
Total Bilirubin: 0.5 mg/dL (ref 0.3–1.2)
Total Protein: 7.4 g/dL (ref 6.5–8.1)

## 2022-01-08 MED ORDER — DOVATO 50-300 MG PO TABS: 1.0000 | ORAL_TABLET | Freq: Every day | ORAL | 3 refills | Status: DC

## 2022-01-08 NOTE — Progress Notes (Signed)
NAME: Crystal [name]  ?DOB: [birth year]  ?MRN: [id number]  ?Date/Time: 01/08/2022 9:21 AM ? ? ?Subjective:  ?Follow-up visit for HIV.  She is here with her group home owner Adonis Huguenin. ?She is [facility] -the owner vivian [phone] is also with her during this visit. ? ?Her attendant says she is losing weight- lost 22 pounds in 1 year ?She has no fever, night sweats, cough, sob, pain abdomen, blood in stool. Has good appetite. No change in meds ?Saw renal specialist for elevated creatinine ?100 % adherent to complera  ? ?  Her Psychiatrist [facility] ?[name] ?S ?The following is taken from the records from the last visit ?Following taken from old records ?Crystal [name] is a 31 y.o. 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 ?I first saw her in March 2022. ?Patient moved to [facility] recently.  Prior to that she was getting care from Dr. Patsey Berthold in [place]. ?She was diagnosed with HIV when she was 31 years old. ?Patient was on Complera until 2021 as prescribed by Dr. Patsey Berthold.  On reviewing the records from Dr. Domingo Dimes office she always had undetectable viral load and a high CD4 count.  HIV diagnosed ? ?Nadir Cd4 unknown ?VL unknown ?OI unknown ?HAARt history unknown ?Genotype done last visit was no resistance ?? ?Past Medical History:  ?Diagnosis Date  ? Cancer [facility])   ? skin  ? Depression   ? HIV (human immunodeficiency virus infection) ([place])   ?Adjustment disorder ?Behavioral disorder ?Mild  intellectual disability ?Past Surgical History:  ?Procedure Laterality Date  ? SKIN SURGERY    ?Skin surgery nose for skin cancer ?Social History  ? ?Socioeconomic History  ? Marital status: Single  ?  Spouse name: Not on file  ? Number of children: Not on file  ? Years of education: Not on file  ? Highest education level: Not on file  ?Occupational History  ? Not on file   ?Tobacco Use  ? Smoking status: Every Day  ?  Packs/day: 0.50  ?  Years: 12.00  ?  Pack years: 6.00  ?  Types: Cigarettes  ? Smokeless tobacco: Never  ?Vaping Use  ? Vaping Use: Never used  ?Substance and Sexual Activity  ? Alcohol use: Not Currently  ? Drug use: Not Currently  ? Sexual activity: Not on file  ?Other Topics Concern  ? Not on file  ?Social History Narrative  ? Not on file  ? ?Social Determinants of Health  ? ?Financial Resource Strain: Not on file  ?Food Insecurity: Not on file  ?Transportation Needs: Not on file  ?Physical Activity: Not on file  ?Stress: Not on file  ?Social Connections: Not on file  ?Intimate Partner Violence: Not on file  ?  ?No family history on file. ?No Known Allergies ?? ?Current Outpatient Medications  ?Medication Sig Dispense Refill  ? ARIPiprazole (ABILIFY) 20 MG tablet Take 1 tablet (20 mg total) by mouth daily. (Patient taking differently: Take 30 mg by mouth daily.) 30 tablet 1  ? atorvastatin (LIPITOR) 20 MG tablet Take 20 mg by mouth daily.    ? busPIRone (BUSPAR) 30 MG tablet Take 1 tablet (30 mg total) by mouth 2 (two) times daily. 60 tablet 1  ? chlorhexidine (PERIDEX) 0.12 % solution Use as directed 15 mLs in the mouth or throat 2 (two) times daily. 1893 mL 0  ? Cholecalciferol (VITAMIN D3)  1.25 MG (50000 UT) TABS Take by mouth.    ? diphenhydrAMINE (BENADRYL) 2 % cream Apply 1 application topically 2 (two) times daily as needed for itching. (apply to feet) 30 g 0  ? docusate sodium (COLACE) 100 MG capsule Take 1 capsule (100 mg total) by mouth 2 (two) times daily. 60 capsule 1  ? emtricitabine-rilpivir-tenofovir DF (COMPLERA) 200-25-300 MG tablet Take 1 tablet by mouth daily. 30 tablet 1  ? hydrOXYzine (VISTARIL) 100 MG capsule Take 1 capsule (100 mg total) by mouth at bedtime. 30 capsule 1  ? linaclotide (LINZESS) 145 MCG CAPS capsule Take 145 mcg by mouth daily before breakfast.    ? medroxyPROGESTERone (DEPO-PROVERA) 150 MG/ML injection Inject 150 mg into the  muscle every 3 (three) months.    ? melatonin 3 MG TABS tablet Take 2 tablets (6 mg total) by mouth at bedtime. 60 tablet 1  ? sertraline (ZOLOFT) 100 MG tablet Take 1.5 tablets (150 mg total) by mouth daily. (Patient taking differently: Take 200 mg by mouth daily. 2 tabs) 45 tablet 1  ? vitamin B-12 (CYANOCOBALAMIN) 1000 MCG tablet Take 1,000 mcg by mouth daily.    ? ?No current facility-administered medications for this visit.  ?  ?REVIEW OF SYSTEMS:  ?Const: negative fever, negative chills, n++weight loss ?Eyes: negative diplopia or visual changes, negative eye pain ?ENT: negative coryza, negative sore throat ?Resp: negative cough, hemoptysis, dyspnea ?Cards: negative for chest pain, palpitations, lower extremity edema ?GU: negative for frequency, dysuria and hematuria ?Skin: negative for rash and pruritus ?Heme: negative for easy bruising and gum/nose bleeding ?MS: negative for myalgias, arthralgias, back pain and muscle weakness ?Neurolo:negative for headaches, dizziness, vertigo, memory problems  ?Psych: As above ?Objective:  ?VITALS:  ?BP 104/71   Pulse 82   Temp 98 ?F (36.7 ?C) (Temporal)   Wt 159 lb (72.1 kg)   BMI 30.04 kg/m?   ?PHYSICAL EXAM:  ?General: Alert, cooperative, no distress, appears stated age.  ?Head: Normocephalic, without obvious abnormality, atraumatic. ?Eyes: Conjunctivae clear, anicteric sclerae. Pupils are equal ?Lungs: Clear to auscultation bilaterally. No Wheezing or Rhonchi. No rales. ?Heart: Regular rate and rhythm, no murmur, rub or gallop. ?Abdomen: Soft, non-tender,not distended. Bowel sounds normal. No masses ?Extremities: Extremities normal, atraumatic, no cyanosis. No edema. No clubbing ?Skin: No rashes or lesions. Not Jaundiced ?Lymph: Cervical, supraclavicular normal. ?Neurologic: Grossly non-focal ? ?Vaccine Date last given comment  ?Influenza    ?Hepatitis B    ?Hepatitis A    ?Prevnar-PCV-13    ?Pneumovac-PPSV-23    ?TdaP    ?HPV    ?Shingrix ( zoster vaccine)     ? ?______________________ ? ?Labs ?Lab Result  Date comment  ?HIV VL Less than 20 12/17/2020   ?CD4 1290 12/17/2020   ?Genotype No resistance Geno sure prime on 12/17/2020   ?[id number]     ?HIV antibody     ?RPR Nonreactive 12/17/2020   ?Quantiferon Gold Nonreactive 12/17/2020   ?Hep C ab Nonreactive 12/17/2020 12/17/2020  ?Hepatitis B-ab,ag,c 16.5 hepatitis B post vaccine antibodies    ?Hepatitis A-IgM, IgG /T Nonreactive    ?Lipid     ?GC/CHL     ?PAP     ?HB,PLT,Cr, LFT 13, 226, 1.11    ? ? ?Preventive  ?Procedure Result  Date comment  ?colonoscopy     ?Mammogram     ?Dental exam     ?Opthal     ? ? ?Impression/Recommendation ?? ?HIV disease on Complera.  Viral load undetectable and CD4 count  more than thousand.  100% adherent to Complera.  Will change complera to diovato because of kidney disease ? ?Increase in creatinine - r/o fanconi due to tenofovir in complera- DC complera and change to dovato ? ?Weight loss of 20 pounds- unclear etiology- ?Check TSH ?R/o HAART not causing the problem ? ?Hyperlipidemia on atorvastatin ??check CK ? ?On medroxyprogesterone for contraception ? ?Abilify and BuSpar for adjustment/behavioral disorder. ?Discussed the management with the patient and the caregiver. ?Follow-up 1 month for labs  ?Spoke to Careers information officer at [facility]  ?

## 2022-01-08 NOTE — Patient Instructions (Signed)
You are here for follow up- you are losing weight- will change complera to dovato because of early kidney disease . Take 1 tablet a day ?

## 2022-01-09 LAB — T-HELPER CELLS CD4/CD8 %
% CD 4 Pos. Lymph.: 54.6 % (ref 30.8–58.5)
Absolute CD 4 Helper: 928 /uL (ref 359–1519)
Basophils Absolute: 0 10*3/uL (ref 0.0–0.2)
Basos: 1 %
CD3+CD4+ Cells/CD3+CD8+ Cells Bld: 1.92 (ref 0.92–3.72)
CD3+CD8+ Cells # Bld: 483 /uL (ref 109–897)
CD3+CD8+ Cells NFr Bld: 28.4 % (ref 12.0–35.5)
EOS (ABSOLUTE): 0.1 10*3/uL (ref 0.0–0.4)
Eos: 1 %
Hematocrit: 40.6 % (ref 34.0–46.6)
Hemoglobin: 13.1 g/dL (ref 11.1–15.9)
Immature Grans (Abs): 0 10*3/uL (ref 0.0–0.1)
Immature Granulocytes: 0 %
Lymphocytes Absolute: 1.7 10*3/uL (ref 0.7–3.1)
Lymphs: 26 %
MCH: 30 pg (ref 26.6–33.0)
MCHC: 32.3 g/dL (ref 31.5–35.7)
MCV: 93 fL (ref 79–97)
Monocytes Absolute: 0.5 10*3/uL (ref 0.1–0.9)
Monocytes: 8 %
Neutrophils Absolute: 4.2 10*3/uL (ref 1.4–7.0)
Neutrophils: 64 %
Platelets: 226 10*3/uL (ref 150–450)
RBC: 4.36 x10E6/uL (ref 3.77–5.28)
RDW: 12.4 % (ref 11.7–15.4)
WBC: 6.5 10*3/uL (ref 3.4–10.8)

## 2022-01-09 LAB — RPR: RPR Ser Ql: NONREACTIVE

## 2022-01-09 LAB — HIV-1 RNA QUANT-NO REFLEX-BLD
HIV 1 RNA Quant: <20 copies/mL
LOG10 HIV-1 RNA: UNDETERMINED log10copy/mL

## 2022-01-14 LAB — QUANTIFERON-TB GOLD PLUS (RQFGPL)
QuantiFERON Mitogen Value: >10 IU/mL
QuantiFERON Nil Value: 0.06 IU/mL
QuantiFERON TB1 Ag Value: 0.06 IU/mL
QuantiFERON TB2 Ag Value: 0.05 IU/mL

## 2022-01-14 LAB — QUANTIFERON-TB GOLD PLUS: QuantiFERON-TB Gold Plus: NEGATIVE

## 2022-01-22 ENCOUNTER — Other Ambulatory Visit: Payer: Self-pay

## 2022-01-22 ENCOUNTER — Emergency Department
Admission: EM | Admit: 2022-01-22 | Discharge: 2022-01-23 | Disposition: A | Discharge status: Home / Self Care | Payer: Medicaid Other | Attending: Emergency Medicine | Admitting: Emergency Medicine

## 2022-01-22 DIAGNOSIS — Z20822 Contact with and (suspected) exposure to covid-19: Secondary | ICD-10-CM | POA: Insufficient documentation

## 2022-01-22 DIAGNOSIS — F17210 Nicotine dependence, cigarettes, uncomplicated: Secondary | ICD-10-CM | POA: Diagnosis not present

## 2022-01-22 DIAGNOSIS — B20 Human immunodeficiency virus [HIV] disease: Secondary | ICD-10-CM | POA: Diagnosis present

## 2022-01-22 DIAGNOSIS — F4325 Adjustment disorder with mixed disturbance of emotions and conduct: Secondary | ICD-10-CM | POA: Diagnosis not present

## 2022-01-22 DIAGNOSIS — R4589 Other symptoms and signs involving emotional state: Secondary | ICD-10-CM

## 2022-01-22 DIAGNOSIS — Z21 Asymptomatic human immunodeficiency virus [HIV] infection status: Secondary | ICD-10-CM | POA: Diagnosis present

## 2022-01-22 DIAGNOSIS — Z85828 Personal history of other malignant neoplasm of skin: Secondary | ICD-10-CM | POA: Insufficient documentation

## 2022-01-22 DIAGNOSIS — R45851 Suicidal ideations: Secondary | ICD-10-CM

## 2022-01-22 DIAGNOSIS — F70 Mild intellectual disabilities: Secondary | ICD-10-CM | POA: Diagnosis not present

## 2022-01-22 LAB — COMPREHENSIVE METABOLIC PANEL
ALT: 12 U/L (ref 0–44)
AST: 12 U/L — ABNORMAL LOW (ref 15–41)
Albumin: 4.6 g/dL (ref 3.5–5.0)
Alkaline Phosphatase: 90 U/L (ref 38–126)
Anion gap: 8 (ref 5–15)
BUN: 13 mg/dL (ref 6–20)
CO2: 21 mmol/L — ABNORMAL LOW (ref 22–32)
Calcium: 9.1 mg/dL (ref 8.9–10.3)
Chloride: 111 mmol/L (ref 98–111)
Creatinine, Ser: 1.31 mg/dL — ABNORMAL HIGH (ref 0.44–1.00)
GFR, Estimated: 56 mL/min — ABNORMAL LOW (ref >60)
Glucose, Bld: 98 mg/dL (ref 70–99)
Potassium: 3.7 mmol/L (ref 3.5–5.1)
Sodium: 140 mmol/L (ref 135–145)
Total Bilirubin: 0.6 mg/dL (ref 0.3–1.2)
Total Protein: 7.5 g/dL (ref 6.5–8.1)

## 2022-01-22 LAB — URINE DRUG SCREEN, QUALITATIVE (ARMC ONLY)
Amphetamines, Ur Screen: NOT DETECTED
Barbiturates, Ur Screen: NOT DETECTED
Benzodiazepine, Ur Scrn: NOT DETECTED
Cannabinoid 50 Ng, Ur ~~LOC~~: NOT DETECTED
Cocaine Metabolite,Ur ~~LOC~~: NOT DETECTED
MDMA (Ecstasy)Ur Screen: NOT DETECTED
Methadone Scn, Ur: NOT DETECTED
Opiate, Ur Screen: NOT DETECTED
Phencyclidine (PCP) Ur S: NOT DETECTED
Tricyclic, Ur Screen: NOT DETECTED

## 2022-01-22 LAB — ACETAMINOPHEN LEVEL: Acetaminophen (Tylenol), Serum: <10 ug/mL — ABNORMAL LOW (ref 10–30)

## 2022-01-22 LAB — RESP PANEL BY RT-PCR (FLU A&B, COVID) ARPGX2
Influenza A by PCR: NEGATIVE
Influenza B by PCR: NEGATIVE
SARS Coronavirus 2 by RT PCR: NEGATIVE

## 2022-01-22 LAB — CBC
HCT: 41.2 % (ref 36.0–46.0)
Hemoglobin: 13 g/dL (ref 12.0–15.0)
MCH: 29.7 pg (ref 26.0–34.0)
MCHC: 31.6 g/dL (ref 30.0–36.0)
MCV: 94.3 fL (ref 80.0–100.0)
Platelets: 237 10*3/uL (ref 150–400)
RBC: 4.37 MIL/uL (ref 3.87–5.11)
RDW: 13 % (ref 11.5–15.5)
WBC: 8.9 10*3/uL (ref 4.0–10.5)
nRBC: 0 % (ref 0.0–0.2)

## 2022-01-22 LAB — ETHANOL: Alcohol, Ethyl (B): <10 mg/dL (ref <10)

## 2022-01-22 LAB — SALICYLATE LEVEL: Salicylate Lvl: <7 mg/dL — ABNORMAL LOW (ref 7.0–30.0)

## 2022-01-22 LAB — PREGNANCY, URINE: Preg Test, Ur: NEGATIVE

## 2022-01-22 MED ORDER — LINACLOTIDE 145 MCG PO CAPS
145.0000 ug | ORAL_CAPSULE | Freq: Every day | ORAL | Status: DC
  Administered 2022-01-23: 145 ug via ORAL
  Filled 2022-01-22: qty 1

## 2022-01-22 MED ORDER — HYDROXYZINE PAMOATE 50 MG PO CAPS
100.0000 mg | ORAL_CAPSULE | Freq: Every day | ORAL | Status: DC
  Filled 2022-01-22: qty 2

## 2022-01-22 MED ORDER — MELATONIN 5 MG PO TABS
5.0000 mg | ORAL_TABLET | Freq: Every day | ORAL | Status: DC
  Administered 2022-01-23: 5 mg via ORAL
  Filled 2022-01-22: qty 1

## 2022-01-22 MED ORDER — ATORVASTATIN CALCIUM 20 MG PO TABS
20.0000 mg | ORAL_TABLET | Freq: Every day | ORAL | Status: DC
  Administered 2022-01-23: 20 mg via ORAL
  Filled 2022-01-22: qty 1

## 2022-01-22 MED ORDER — SERTRALINE HCL 50 MG PO TABS
200.0000 mg | ORAL_TABLET | Freq: Every day | ORAL | Status: DC
  Administered 2022-01-23: 200 mg via ORAL
  Filled 2022-01-22: qty 4

## 2022-01-22 MED ORDER — ARIPIPRAZOLE 15 MG PO TABS
30.0000 mg | ORAL_TABLET | Freq: Every day | ORAL | Status: DC
  Administered 2022-01-23: 30 mg via ORAL
  Filled 2022-01-22: qty 2

## 2022-01-22 MED ORDER — DOCUSATE SODIUM 100 MG PO CAPS
100.0000 mg | ORAL_CAPSULE | Freq: Two times a day (BID) | ORAL | Status: DC
  Administered 2022-01-23 (×2): 100 mg via ORAL
  Filled 2022-01-22 (×2): qty 1

## 2022-01-22 MED ORDER — DOLUTEGRAVIR-LAMIVUDINE 50-300 MG PO TABS: 1.0000 | ORAL_TABLET | Freq: Every day | ORAL | Status: DC

## 2022-01-22 MED ORDER — ACETAMINOPHEN 325 MG PO TABS: 650.0000 mg | ORAL_TABLET | ORAL | Status: DC | PRN

## 2022-01-22 MED ORDER — BUSPIRONE HCL 5 MG PO TABS
30.0000 mg | ORAL_TABLET | Freq: Two times a day (BID) | ORAL | Status: DC
  Administered 2022-01-23 (×2): 30 mg via ORAL
  Filled 2022-01-22 (×2): qty 6

## 2022-01-22 NOTE — ED Notes (Signed)
Contacted [employer] to speak to someone on situation. JJ with [employer] notified this nruse that Officer Somasundaram is on a call at this time and when cleared would be requested to return call to this nurse ?
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
## 2022-01-22 NOTE — ED Notes (Signed)
Patient transferred from Triage to room [facility] after dressing out and screening for contraband. Report received from Boykin, RN including situation, background, assessment and recommendations. Pt oriented to [employer] including Q15 minute rounds as well as Engineer, drilling for their protection. Patient is alert and oriented, warm and dry in no acute distress. Patient denies HI and AVH. Pt. Encouraged to let this nurse know if needs arise.  ?

## 2022-01-22 NOTE — ED Notes (Signed)
Pt provided with snack and drink 
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
## 2022-01-22 NOTE — ED Notes (Signed)
Pt is expressing unhappiness with psyc team stating that she feels they believe she is lying about events that have occurred. Pt educated on plan of care, continues to talk as if not hearing nurse. Calm at this time, will keep pt updated with plan of care ?

## 2022-01-22 NOTE — ED Notes (Signed)
Psych team is here for evaluations ?

## 2022-01-22 NOTE — ED Provider Notes (Addendum)
? ?[facility] ?Provider Note ? ? ? Event Date/Time  ? First MD Initiated Contact with Patient 01/22/22 1958   ?  (approximate) ? ? ?History  ? ?Suicidal ? ? ?HPI ? ?Crystal [name] is a 31 y.o. female with a history of intellectual disability, HIV who is sent to the ED from her group home due to suicidal ideation.  Patient has been seen multiple times in the past for suicidal ideation, discharged each time.  Symptoms today are not different from her usual presentation.  No recent self-injurious behavior or ingestion.  She reports a similar plan as she previously did.  She denies a specific intent to harm himself.  No HI or hallucinations. ? ?Denies any acute medical complaints such as pain fever or vomiting. ?  ? ? ?Physical Exam  ? ?Triage Vital Signs: ?ED Triage Vitals  ?Enc Vitals Group  ?   BP 01/22/22 1917 136/78  ?   Pulse Rate 01/22/22 1917 94  ?   Resp 01/22/22 1917 18  ?   Temp 01/22/22 1917 98.3 ?F (36.8 ?C)  ?   Temp Source 01/22/22 1917 Oral  ?   SpO2 01/22/22 1917 97 %  ?   Weight 01/22/22 1918 160 lb (72.6 kg)  ?   Height 01/22/22 1918 '5\' 1"'$  (1.549 m)  ?   Head Circumference --   ?   Peak Flow --   ?   Pain Score 01/22/22 1934 0  ?   Pain Loc --   ?   Pain Edu? --   ?   Excl. in [place]? --   ? ? ?Most recent vital signs: ?Vitals:  ? 01/22/22 1917  ?BP: 136/78  ?Pulse: 94  ?Resp: 18  ?Temp: 98.3 ?F (36.8 ?C)  ?SpO2: 97%  ? ? ? ?General: Awake, no distress.  ?CV:  Good peripheral perfusion.  ?Resp:  Normal effort.  ?Abd:  No distention.  ? ? ? ?ED Results / Procedures / Treatments  ? ?Labs ?(all labs ordered are listed, but only abnormal results are displayed) ?Labs Reviewed  ?COMPREHENSIVE METABOLIC PANEL - Abnormal; Notable for the following components:  ?    Result Value  ? CO2 21 (*)   ? Creatinine, Ser 1.31 (*)   ? AST 12 (*)   ? GFR, Estimated 56 (*)   ? All other components within normal limits  ?SALICYLATE LEVEL - Abnormal; Notable for the following components:  ? Salicylate  Lvl <4.0 (*)   ? All other components within normal limits  ?ACETAMINOPHEN LEVEL - Abnormal; Notable for the following components:  ? Acetaminophen (Tylenol), Serum <10 (*)   ? All other components within normal limits  ?RESP PANEL BY RT-PCR (FLU A&B, COVID) ARPGX2  ?ETHANOL  ?CBC  ?URINE DRUG SCREEN, QUALITATIVE ([facility] ONLY)  ?PREGNANCY, URINE  ? ? ? ?EKG ? ? ? ? ?RADIOLOGY ? ? ? ? ?PROCEDURES: ? ?Critical Care performed: No ? ?Procedures ? ? ?MEDICATIONS ORDERED IN ED: ?Medications - No data to display ? ? ?IMPRESSION / MDM / ASSESSMENT AND PLAN / ED COURSE  ?I reviewed the triage vital signs and the nursing notes. ?             ?               ? ?Patient brought to the ED due to suicidal ideation.  She does not have a realistic intention or plan to harm herself currently.  She is not committable.  We will request psychiatry evaluation.  She is medically stable. ? ?The patient has been placed in psychiatric observation due to the need to provide a safe environment for the patient while obtaining psychiatric consultation and evaluation, as well as ongoing medical and medication management to treat the patient's condition.  The patient has not been placed under full IVC at this time. ? ? ?----------------------------------------- ?9:46 PM on 01/22/2022 ?----------------------------------------- ?Case discussed with psychiatry who finds the patient be psychiatrically stable, not an imminent danger to herself or others.  Recommends discharge.  I called patient's guardian, [name] of [place] services, unable to reach him.  Patient is safe discharge with group home who is aware. ?  ? ? ?FINAL CLINICAL IMPRESSION(S) / ED DIAGNOSES  ? ?Final diagnoses:  ?Suicidal ideation  ? ? ? ?Rx / DC Orders  ? ?ED Discharge Orders   ? ? None  ? ?  ? ? ? ?Note:  This document was prepared using Dragon voice recognition software and may include unintentional dictation errors. ?  ?Carrie Mew, MD ?01/22/22 2121 ? ?   ?Carrie Mew, MD ?01/22/22 2147 ? ?

## 2022-01-22 NOTE — ED Notes (Signed)
Pych at bedside  ?

## 2022-01-23 MED ORDER — HYDROXYZINE HCL 25 MG PO TABS: 100.0000 mg | ORAL_TABLET | Freq: Every day | ORAL | Status: DC

## 2022-01-23 MED ORDER — HYDROXYZINE HCL 50 MG PO TABS
100.0000 mg | ORAL_TABLET | Freq: Every day | ORAL | Status: DC
  Administered 2022-01-23: 100 mg via ORAL
  Filled 2022-01-23 (×2): qty 2

## 2022-01-23 MED ORDER — LAMIVUDINE 150 MG PO TABS
300.0000 mg | ORAL_TABLET | Freq: Every day | ORAL | Status: DC
  Filled 2022-01-23 (×2): qty 2

## 2022-01-23 MED ORDER — DOLUTEGRAVIR SODIUM 50 MG PO TABS
50.0000 mg | ORAL_TABLET | Freq: Every day | ORAL | Status: DC
  Filled 2022-01-23: qty 1

## 2022-01-23 NOTE — ED Provider Notes (Signed)
Today's Vitals  ? 01/22/22 1917 01/22/22 1918 01/22/22 1934  ?BP: 136/78    ?Pulse: 94    ?Resp: 18    ?Temp: 98.3 ?F (36.8 ?C)    ?TempSrc: Oral    ?SpO2: 97%    ?Weight:  72.6 kg   ?Height:  '5\' 1"'$  (1.549 m)   ?PainSc:   0-No pain  ? ?Body mass index is 30.23 kg/m?. ? ? ?No acute events overnight.  Awaiting social work disposition. ?  ?Fannie Alomar, Delice Bison, DO ?01/23/22 1017 ? ?
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
## 2022-01-23 NOTE — ED Notes (Signed)
Pt given shower supplies, currently in shower at this time. 

## 2022-01-23 NOTE — ED Notes (Signed)
Pt out of shower now. ?

## 2022-01-23 NOTE — ED Notes (Signed)
Pt given breakfast tray and drink 

## 2022-02-10 ENCOUNTER — Ambulatory Visit: Payer: Medicaid Other | Admitting: Infectious Diseases

## 2022-02-17 ENCOUNTER — Ambulatory Visit: Payer: Medicaid Other | Admitting: Infectious Diseases

## 2022-03-05 ENCOUNTER — Ambulatory Visit: Payer: Medicaid Other | Admitting: Infectious Diseases

## 2022-03-08 ENCOUNTER — Emergency Department
Admission: EM | Admit: 2022-03-08 | Discharge: 2022-03-08 | Disposition: A | Discharge status: Nursing Home (Includes ICF) | Payer: Medicaid Other | Attending: Emergency Medicine | Admitting: Emergency Medicine

## 2022-03-08 ENCOUNTER — Other Ambulatory Visit: Payer: Self-pay

## 2022-03-08 ENCOUNTER — Encounter: Payer: Self-pay | Admitting: Emergency Medicine

## 2022-03-08 DIAGNOSIS — R45851 Suicidal ideations: Secondary | ICD-10-CM | POA: Diagnosis not present

## 2022-03-08 DIAGNOSIS — Z85828 Personal history of other malignant neoplasm of skin: Secondary | ICD-10-CM | POA: Diagnosis not present

## 2022-03-08 DIAGNOSIS — Z20822 Contact with and (suspected) exposure to covid-19: Secondary | ICD-10-CM | POA: Insufficient documentation

## 2022-03-08 DIAGNOSIS — Z21 Asymptomatic human immunodeficiency virus [HIV] infection status: Secondary | ICD-10-CM | POA: Diagnosis not present

## 2022-03-08 DIAGNOSIS — Z1339 Encounter for screening examination for other mental health and behavioral disorders: Secondary | ICD-10-CM | POA: Insufficient documentation

## 2022-03-08 DIAGNOSIS — F4325 Adjustment disorder with mixed disturbance of emotions and conduct: Secondary | ICD-10-CM | POA: Diagnosis present

## 2022-03-08 LAB — COMPREHENSIVE METABOLIC PANEL
ALT: 12 U/L (ref 0–44)
AST: 11 U/L — ABNORMAL LOW (ref 15–41)
Albumin: 4.1 g/dL (ref 3.5–5.0)
Alkaline Phosphatase: 81 U/L (ref 38–126)
Anion gap: 8 (ref 5–15)
BUN: 15 mg/dL (ref 6–20)
CO2: 23 mmol/L (ref 22–32)
Calcium: 9 mg/dL (ref 8.9–10.3)
Chloride: 108 mmol/L (ref 98–111)
Creatinine, Ser: 1.14 mg/dL — ABNORMAL HIGH (ref 0.44–1.00)
GFR, Estimated: >60 mL/min (ref >60)
Glucose, Bld: 110 mg/dL — ABNORMAL HIGH (ref 70–99)
Potassium: 4 mmol/L (ref 3.5–5.1)
Sodium: 139 mmol/L (ref 135–145)
Total Bilirubin: 0.3 mg/dL (ref 0.3–1.2)
Total Protein: 7.5 g/dL (ref 6.5–8.1)

## 2022-03-08 LAB — CBC
HCT: 38.8 % (ref 36.0–46.0)
Hemoglobin: 12.3 g/dL (ref 12.0–15.0)
MCH: 30.6 pg (ref 26.0–34.0)
MCHC: 31.7 g/dL (ref 30.0–36.0)
MCV: 96.5 fL (ref 80.0–100.0)
Platelets: 205 10*3/uL (ref 150–400)
RBC: 4.02 MIL/uL (ref 3.87–5.11)
RDW: 13.3 % (ref 11.5–15.5)
WBC: 8.9 10*3/uL (ref 4.0–10.5)
nRBC: 0 % (ref 0.0–0.2)

## 2022-03-08 LAB — ETHANOL: Alcohol, Ethyl (B): <10 mg/dL (ref <10)

## 2022-03-08 LAB — SALICYLATE LEVEL: Salicylate Lvl: <7 mg/dL — ABNORMAL LOW (ref 7.0–30.0)

## 2022-03-08 LAB — SARS CORONAVIRUS 2 BY RT PCR: SARS Coronavirus 2 by RT PCR: NEGATIVE

## 2022-03-08 LAB — ACETAMINOPHEN LEVEL: Acetaminophen (Tylenol), Serum: <10 ug/mL — ABNORMAL LOW (ref 10–30)
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
## 2022-03-08 NOTE — Discharge Instructions (Signed)
Return to the ER immediately for new, worsening, or persistent thoughts of harming yourself or any thoughts of harming others, hearing voices, or any other new or worsening symptoms that concern you.  Follow-up with your regular mental health provider.

## 2022-03-08 NOTE — ED Notes (Signed)
Pt. Transferred from Triage to room 3 after dressing out and screening for contraband. Report to include Situation, Background, Assessment and Recommendations from [facility]. Pt. Oriented to Quad including Q15 minute rounds as well as Engineer, drilling for their protection. Patient is alert and oriented, warm and dry in no acute distress. Patient denies SI, HI, and AVH. Pt. Encouraged to let me know if needs arise.

## 2022-03-08 NOTE — ED Notes (Signed)
1 pair pants, 1 pair underwear, 1 t-shirt, 1 bra. Pt dressed out by this RN and Shawn, EDT.
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
## 2022-03-08 NOTE — ED Provider Notes (Signed)
-----------------------------------------   10:47 AM on 03/08/2022 -----------------------------------------  The patient has been reevaluated by psychiatry this morning.  Per NP Lord, the patient is cleared for discharge.  She advises that the patient has spoken to police about her concerns.  Discharge instructions and return precautions have been provided.   Arta Silence, MD 03/08/22 1049

## 2022-03-17 ENCOUNTER — Encounter: Payer: Self-pay | Admitting: Infectious Diseases

## 2022-03-17 ENCOUNTER — Other Ambulatory Visit
Admission: RE | Admit: 2022-03-17 | Discharge: 2022-03-17 | Disposition: A | Discharge status: Home / Self Care | Payer: Medicaid Other | Attending: Infectious Diseases | Admitting: Infectious Diseases

## 2022-03-17 ENCOUNTER — Ambulatory Visit: Payer: Medicaid Other | Attending: Infectious Diseases | Admitting: Infectious Diseases

## 2022-03-17 VITALS — BP 96/67 | HR 89 | Temp 98.4°F | Wt 147.0 lb

## 2022-03-17 DIAGNOSIS — Z79899 Other long term (current) drug therapy: Secondary | ICD-10-CM | POA: Diagnosis not present

## 2022-03-17 DIAGNOSIS — B20 Human immunodeficiency virus [HIV] disease: Secondary | ICD-10-CM | POA: Insufficient documentation

## 2022-03-17 DIAGNOSIS — E785 Hyperlipidemia, unspecified: Secondary | ICD-10-CM | POA: Insufficient documentation

## 2022-03-17 DIAGNOSIS — F919 Conduct disorder, unspecified: Secondary | ICD-10-CM | POA: Insufficient documentation

## 2022-03-17 DIAGNOSIS — Z6827 Body mass index (BMI) 27.0-27.9, adult: Secondary | ICD-10-CM | POA: Insufficient documentation

## 2022-03-17 DIAGNOSIS — F4320 Adjustment disorder, unspecified: Secondary | ICD-10-CM | POA: Diagnosis not present

## 2022-03-17 DIAGNOSIS — Z21 Asymptomatic human immunodeficiency virus [HIV] infection status: Secondary | ICD-10-CM | POA: Diagnosis present

## 2022-03-17 DIAGNOSIS — R634 Abnormal weight loss: Secondary | ICD-10-CM | POA: Diagnosis not present

## 2022-03-17 LAB — CREATININE, SERUM
Creatinine, Ser: 1.35 mg/dL — ABNORMAL HIGH (ref 0.44–1.00)
GFR, Estimated: 54 mL/min — ABNORMAL LOW (ref >60)

## 2022-03-17 LAB — URINALYSIS, ROUTINE W REFLEX MICROSCOPIC
Bilirubin Urine: NEGATIVE
Glucose, UA: NEGATIVE mg/dL
Hgb urine dipstick: NEGATIVE
Ketones, ur: NEGATIVE mg/dL
Nitrite: NEGATIVE
Protein, ur: 30 mg/dL — AB
Specific Gravity, Urine: 1.023 (ref 1.005–1.030)
pH: 5 (ref 5.0–8.0)

## 2022-03-17 LAB — TSH: TSH: 0.913 u[IU]/mL (ref 0.350–4.500)

## 2022-03-17 MED ORDER — DOVATO 50-300 MG PO TABS
1.0000 | ORAL_TABLET | Freq: Every day | ORAL | 5 refills | Status: DC
Start: 2022-03-17 — End: 2023-03-31

## 2022-03-17 NOTE — Progress Notes (Signed)
NAME: Crystal [name]  DOB: 1991-08-03  MRN: [id number]  Date/Time: 03/17/2022 9:10 AM   Subjective:  Follow-up visit for HIV.  She is here with her group home  personnel Rosey Bath She is [facility] Two months ago the HAART was changed from complera  ( tenofovir, FTC and rilpavarine to Dovato to eliminate tenofovir which can cause renal insufficiency, Fanconi syndrome    she continues to lose  weight- lost 37 pounds in 16 months She has no fever, night sweats, cough, sob, pain abdomen, blood in stool. Has good appetite.    Cheryl lindley FNP-PCP  The following is taken from the records from the last visit Following taken from old records [name] is a 31 y.o. 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   She was diagnosed with HIV when she was 31 years old. Patient was on Complera until 2021 as prescribed by Dr. Jayme Cloud.  On reviewing the records from Dr. Georgann Housekeeper office she always had undetectable viral load and a high CD4 count.  HIV diagnosed ? Nadir Cd4 unknown VL unknown OI unknown HAARt history unknown Genotype  no resistance ? Past Medical History:  Diagnosis Date   Cancer (HCC)    skin   Depression    HIV (human immunodeficiency virus infection) ([facility])   Adjustment disorder Behavioral disorder Mild  intellectual disability Past Surgical History:  Procedure Laterality Date   SKIN SURGERY    Skin surgery nose for skin cancer Social History   Socioeconomic History   Marital status: Single    Spouse name: Not on file   Number of children: Not on file   Years of education: Not on file   Highest education level: Not on file  Occupational History   Not on file  Tobacco Use   Smoking status: Every Day    Packs/day: 0.50    Years: 12.00    Total pack years: 6.00    Types: Cigarettes   Smokeless tobacco: Never  Vaping Use   Vaping Use: Never  used  Substance and Sexual Activity   Alcohol use: Not Currently   Drug use: Not Currently   Sexual activity: Not on file  Other Topics Concern   Not on file  Social History Narrative   Not on file   Social Determinants of Health   Financial Resource Strain: Not on file  Food Insecurity: Not on file  Transportation Needs: Not on file  Physical Activity: Not on file  Stress: Not on file  Social Connections: Not on file  Intimate Partner Violence: Not on file    No family history on file. No Known Allergies ? Current Outpatient Medications  Medication Sig Dispense Refill   ARIPiprazole (ABILIFY) 20 MG tablet Take 1 tablet (20 mg total) by mouth daily. (Patient taking differently: Take 30 mg by mouth daily.) 30 tablet 1   atorvastatin (LIPITOR) 20 MG tablet Take 20 mg by mouth daily.     busPIRone (BUSPAR) 30 MG tablet Take 1 tablet (30 mg total) by mouth 2 (two) times daily. 60 tablet 1   chlorhexidine (PERIDEX) 0.12 % solution Use as directed 15 mLs in the mouth or throat 2 (two) times daily. 1893 mL 0   Cholecalciferol (VITAMIN D3) 1.25 MG (50000 UT) TABS Take 1 capsule by mouth once a week.     diphenhydrAMINE (BENADRYL) 2 % cream Apply 1 application topically 2 (two) times  daily as needed for itching. (apply to feet) 30 g 0   docusate sodium (COLACE) 100 MG capsule Take 1 capsule (100 mg total) by mouth 2 (two) times daily. 60 capsule 1   dolutegravir-lamiVUDine (DOVATO) 50-300 MG tablet Take 1 tablet by mouth daily. 30 tablet 3   hydrOXYzine (VISTARIL) 100 MG capsule Take 1 capsule (100 mg total) by mouth at bedtime. 30 capsule 1   linaclotide (LINZESS) 145 MCG CAPS capsule Take 145 mcg by mouth daily before breakfast.     medroxyPROGESTERone (DEPO-PROVERA) 150 MG/ML injection Inject 150 mg into the muscle every 3 (three) months.     melatonin 3 MG TABS tablet Take 2 tablets (6 mg total) by mouth at bedtime. 60 tablet 1   sertraline (ZOLOFT) 100 MG tablet Take 1.5 tablets (150  mg total) by mouth daily. (Patient taking differently: Take 200 mg by mouth daily. 2 tabs) 45 tablet 1   vitamin B-12 (CYANOCOBALAMIN) 1000 MCG tablet Take 1,000 mcg by mouth daily.     No current facility-administered medications for this visit.    REVIEW OF SYSTEMS:  Const: negative fever, negative chills, n++weight loss Eyes: negative diplopia or visual changes, negative eye pain ENT: negative coryza, negative sore throat Resp: negative cough, hemoptysis, dyspnea Cards: negative for chest pain, palpitations, lower extremity edema GU: negative for frequency, dysuria and hematuria Skin: negative for rash and pruritus Heme: negative for easy bruising and gum/nose bleeding MS: negative for myalgias, arthralgias, back pain and muscle weakness Neurolo:negative for headaches, dizziness, vertigo, memory problems  Psych: As above Objective:  VITALS:  BP 96/67   Pulse 89   Temp 98.4 F (36.9 C) (Temporal)   Wt 147 lb (66.7 kg)   BMI 27.78 kg/m   PHYSICAL EXAM:  General: Alert, cooperative, no distress, appears stated age.  Head: Normocephalic, without obvious abnormality, atraumatic. Eyes: Conjunctivae clear, anicteric sclerae. Pupils are equal Lungs: Clear to auscultation bilaterally. No Wheezing or Rhonchi. No rales. Heart: Regular rate and rhythm, no murmur, rub or gallop. Abdomen: Soft, non-tender,not distended. Bowel sounds normal. No masses Extremities: Extremities normal, atraumatic, no cyanosis. No edema. No clubbing Skin: No rashes or lesions. Not Jaundiced Lymph: Cervical, supraclavicular normal. Neurologic: Grossly non-focal  Vaccine Date last given comment  Influenza    Hepatitis B    Hepatitis A    Prevnar-PCV-13    Pneumovac-PPSV-23    TdaP 04/30/21   HPV    Shingrix ( zoster vaccine)     ______________________  Labs Lab Result  Date comment  HIV VL Less than 20 12/2021   CD4 928 ( 54%) 01/08/22   Genotype No resistance Geno sure prime on 12/17/2020    [id number]     HIV antibody     RPR Nonreactive 01/08/22   Quantiferon Gold Nonreactive 01/08/22   Hep C ab Nonreactive 12/17/2020 12/17/2020  Hepatitis B-ab,ag,c 16.5 hepatitis B post vaccine antibodies    Hepatitis A-IgM, IgG /T Nonreactive    Lipid     GC/CHL     PAP     HB,PLT,Cr, LFT 13, 226, 1.11      Preventive  Procedure Result  Date comment  colonoscopy     Mammogram     Dental exam     Opthal       Impression/Recommendation ? HIV disease .    changed complera to dovato because of kidney disease in Feb 2023  Increase in creatinine - discontinued complera and changed to Dovato to eliminate Tenofovir Now the crcl >  60 and cr is 1.14 ( was 1.31)  Weight loss of 37 pounds-in 1 year- TSH-N Will discuss with her PCP  Hyperlipidemia on atorvastatin   On medroxyprogesterone for contraception  Abilify and BuSpar for adjustment/behavioral disorder. Discussed the management with the patient and the caregiver.  Follow up lab today And appt in 6 months

## 2022-03-18 LAB — HIV-1 RNA QUANT-NO REFLEX-BLD
HIV 1 RNA Quant: <20 copies/mL
LOG10 HIV-1 RNA: UNDETERMINED log10copy/mL

## 2022-03-23 ENCOUNTER — Telehealth: Payer: Self-pay

## 2022-03-23 NOTE — Telephone Encounter (Signed)
Results given to Deirdre Peer at [facility] wanted to let Dr.Ravishankar know that they try to get patient to drink more water but they will continue to try.   [place], CMA

## 2022-03-23 NOTE — Telephone Encounter (Signed)
-----   Message from Tsosie Billing, MD sent at 03/23/2022  1:45 PM EDT ----- Can you please let her know that the kidney function is still slihtly borderline- she needs to drink a lot of water. Other labs for HIV look good ----- Message ----- From: Buel Ream, Lab In [place] Sent: 03/17/2022  10:35 AM EDT To: Tsosie Billing, MD

## 2022-04-01 ENCOUNTER — Other Ambulatory Visit: Payer: Self-pay

## 2022-04-01 ENCOUNTER — Emergency Department
Admission: EM | Admit: 2022-04-01 | Discharge: 2022-04-06 | Disposition: A | Discharge status: Home / Self Care | Payer: Medicaid Other | Attending: Emergency Medicine | Admitting: Emergency Medicine

## 2022-04-01 ENCOUNTER — Emergency Department: Payer: Medicaid Other

## 2022-04-01 DIAGNOSIS — F70 Mild intellectual disabilities: Secondary | ICD-10-CM | POA: Diagnosis present

## 2022-04-01 DIAGNOSIS — F840 Autistic disorder: Secondary | ICD-10-CM

## 2022-04-01 DIAGNOSIS — Z20822 Contact with and (suspected) exposure to covid-19: Secondary | ICD-10-CM | POA: Diagnosis not present

## 2022-04-01 DIAGNOSIS — F4325 Adjustment disorder with mixed disturbance of emotions and conduct: Secondary | ICD-10-CM | POA: Diagnosis not present

## 2022-04-01 DIAGNOSIS — Z21 Asymptomatic human immunodeficiency virus [HIV] infection status: Secondary | ICD-10-CM | POA: Diagnosis present

## 2022-04-01 DIAGNOSIS — Z046 Encounter for general psychiatric examination, requested by authority: Secondary | ICD-10-CM | POA: Diagnosis present

## 2022-04-01 DIAGNOSIS — R45851 Suicidal ideations: Secondary | ICD-10-CM | POA: Diagnosis not present

## 2022-04-01 DIAGNOSIS — B20 Human immunodeficiency virus [HIV] disease: Secondary | ICD-10-CM | POA: Diagnosis present

## 2022-04-01 LAB — URINE DRUG SCREEN, QUALITATIVE (ARMC ONLY)
Amphetamines, Ur Screen: NOT DETECTED
Barbiturates, Ur Screen: NOT DETECTED
Benzodiazepine, Ur Scrn: NOT DETECTED
Cannabinoid 50 Ng, Ur ~~LOC~~: NOT DETECTED
Cocaine Metabolite,Ur ~~LOC~~: NOT DETECTED
MDMA (Ecstasy)Ur Screen: NOT DETECTED
Methadone Scn, Ur: NOT DETECTED
Opiate, Ur Screen: NOT DETECTED
Phencyclidine (PCP) Ur S: NOT DETECTED
Tricyclic, Ur Screen: POSITIVE — AB

## 2022-04-01 LAB — CBC
HCT: 36.2 % (ref 36.0–46.0)
Hemoglobin: 11.7 g/dL — ABNORMAL LOW (ref 12.0–15.0)
MCH: 30.9 pg (ref 26.0–34.0)
MCHC: 32.3 g/dL (ref 30.0–36.0)
MCV: 95.5 fL (ref 80.0–100.0)
Platelets: 179 10*3/uL (ref 150–400)
RBC: 3.79 MIL/uL — ABNORMAL LOW (ref 3.87–5.11)
RDW: 12.9 % (ref 11.5–15.5)
WBC: 7.1 10*3/uL (ref 4.0–10.5)
nRBC: 0 % (ref 0.0–0.2)

## 2022-04-01 LAB — COMPREHENSIVE METABOLIC PANEL
ALT: 11 U/L (ref 0–44)
AST: 11 U/L — ABNORMAL LOW (ref 15–41)
Albumin: 4.2 g/dL (ref 3.5–5.0)
Alkaline Phosphatase: 50 U/L (ref 38–126)
Anion gap: 5 (ref 5–15)
BUN: 13 mg/dL (ref 6–20)
CO2: 21 mmol/L — ABNORMAL LOW (ref 22–32)
Calcium: 8.8 mg/dL — ABNORMAL LOW (ref 8.9–10.3)
Chloride: 114 mmol/L — ABNORMAL HIGH (ref 98–111)
Creatinine, Ser: 1.19 mg/dL — ABNORMAL HIGH (ref 0.44–1.00)
GFR, Estimated: >60 mL/min (ref >60)
Glucose, Bld: 98 mg/dL (ref 70–99)
Potassium: 3.6 mmol/L (ref 3.5–5.1)
Sodium: 140 mmol/L (ref 135–145)
Total Bilirubin: 0.5 mg/dL (ref 0.3–1.2)
Total Protein: 7.1 g/dL (ref 6.5–8.1)

## 2022-04-01 LAB — ETHANOL: Alcohol, Ethyl (B): <10 mg/dL (ref <10)

## 2022-04-01 LAB — ACETAMINOPHEN LEVEL: Acetaminophen (Tylenol), Serum: <10 ug/mL — ABNORMAL LOW (ref 10–30)

## 2022-04-01 LAB — SALICYLATE LEVEL: Salicylate Lvl: <7 mg/dL — ABNORMAL LOW (ref 7.0–30.0)

## 2022-04-01 LAB — POC URINE PREG, ED: Preg Test, Ur: NEGATIVE

## 2022-04-01 MED ORDER — LAMIVUDINE 150 MG PO TABS
300.0000 mg | ORAL_TABLET | Freq: Every day | ORAL | Status: DC
  Administered 2022-04-02 – 2022-04-06 (×5): 300 mg via ORAL
  Filled 2022-04-01 (×5): qty 2

## 2022-04-01 MED ORDER — LORAZEPAM 1 MG PO TABS
1.0000 mg | ORAL_TABLET | Freq: Once | ORAL | Status: AC
  Administered 2022-04-01: 1 mg via ORAL
  Filled 2022-04-01: qty 1

## 2022-04-01 MED ORDER — DOCUSATE SODIUM 100 MG PO CAPS
100.0000 mg | ORAL_CAPSULE | Freq: Two times a day (BID) | ORAL | Status: DC
  Administered 2022-04-01 – 2022-04-06 (×10): 100 mg via ORAL
  Filled 2022-04-01 (×10): qty 1

## 2022-04-01 MED ORDER — MELATONIN 5 MG PO TABS
5.0000 mg | ORAL_TABLET | Freq: Every day | ORAL | Status: DC
  Administered 2022-04-01 – 2022-04-05 (×5): 5 mg via ORAL
  Filled 2022-04-01 (×6): qty 1

## 2022-04-01 MED ORDER — SERTRALINE HCL 50 MG PO TABS
150.0000 mg | ORAL_TABLET | Freq: Every day | ORAL | Status: DC
  Administered 2022-04-02 – 2022-04-06 (×5): 150 mg via ORAL
  Filled 2022-04-01 (×5): qty 1

## 2022-04-01 MED ORDER — ATORVASTATIN CALCIUM 20 MG PO TABS
20.0000 mg | ORAL_TABLET | Freq: Every day | ORAL | Status: DC
  Administered 2022-04-02 – 2022-04-06 (×5): 20 mg via ORAL
  Filled 2022-04-01 (×5): qty 1

## 2022-04-01 MED ORDER — HYDROXYZINE PAMOATE 50 MG PO CAPS
100.0000 mg | ORAL_CAPSULE | Freq: Every day | ORAL | Status: DC
  Filled 2022-04-01: qty 2

## 2022-04-01 MED ORDER — ARIPIPRAZOLE 10 MG PO TABS
20.0000 mg | ORAL_TABLET | Freq: Every day | ORAL | Status: DC
  Administered 2022-04-02 – 2022-04-06 (×5): 20 mg via ORAL
  Filled 2022-04-01 (×5): qty 2

## 2022-04-01 MED ORDER — DOLUTEGRAVIR SODIUM 50 MG PO TABS
50.0000 mg | ORAL_TABLET | Freq: Every day | ORAL | Status: DC
Start: 2022-04-02 — End: 2022-04-06
  Administered 2022-04-02 – 2022-04-06 (×5): 50 mg via ORAL
  Filled 2022-04-01 (×5): qty 1

## 2022-04-01 MED ORDER — BUSPIRONE HCL 10 MG PO TABS
30.0000 mg | ORAL_TABLET | Freq: Two times a day (BID) | ORAL | Status: DC
  Administered 2022-04-01 – 2022-04-06 (×10): 30 mg via ORAL
  Filled 2022-04-01 (×10): qty 3

## 2022-04-01 MED ORDER — DOLUTEGRAVIR-LAMIVUDINE 50-300 MG PO TABS: 1.0000 | ORAL_TABLET | Freq: Every day | ORAL | Status: DC

## 2022-04-01 MED ORDER — LINACLOTIDE 145 MCG PO CAPS
145.0000 ug | ORAL_CAPSULE | Freq: Every day | ORAL | Status: DC
  Administered 2022-04-02 – 2022-04-06 (×5): 145 ug via ORAL
  Filled 2022-04-01 (×5): qty 1

## 2022-04-01 NOTE — ED Notes (Signed)
VOL/Pending Dispostion

## 2022-04-01 NOTE — Discharge Instructions (Addendum)
Continue your medications.  Follow-up with your providers.  Do not hesitate to return if you need to.

## 2022-04-01 NOTE — ED Notes (Signed)
Hospital meal provided.  100% consumed, pt tolerated w/o complaints.  Waste discarded appropriately.   

## 2022-04-01 NOTE — ED Notes (Signed)
[employer] contacted to get after hours social worker/legal guardian rep

## 2022-04-01 NOTE — ED Notes (Signed)
IVC PENDING  CONSULT ?
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
## 2022-04-01 NOTE — ED Notes (Signed)
Received call from Adonis Huguenin, owner of Group home stating her new legal Guardian is Robyne Peers @ [phone].

## 2022-04-01 NOTE — ED Notes (Signed)
This writer attempted to contact pt new legal guardian, Crystal [name], with no answer, will attempt to call back shortly.

## 2022-04-01 NOTE — ED Notes (Signed)
IVC  PAPERS  RESCINDED  PER  DR  CLAPACS  MD INFORMED  ALLY  RN
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
## 2022-04-01 NOTE — ED Notes (Signed)
Attempted to call "Mel Almond" at [phone] as provided by patient of legal guardian's number.

## 2022-04-01 NOTE — ED Notes (Signed)
Pt belongings:  1 grey shirt 1 pair of blue pajama pants 1 pair of blue underwear

## 2022-04-01 NOTE — ED Notes (Addendum)
This Probation officer explained disposition to pt once legal guardian is notified. Pt continuously stating she does not want to go back to group home.  Pt then walked to lobby stating " I can do what I want" and continued to walk out of department. ED charge RN, vanessa at scene of pt, trying to verbally deescalate situation. [employer] notified about pt leaving.

## 2022-04-01 NOTE — ED Notes (Signed)
This Probation officer spoke with Group home owner vivian about pt updated dispo, per vivian if pt comes back to group home she will need a 1:1 sitter that needs to be approved with manage care company before she comes back.

## 2022-04-01 NOTE — ED Notes (Addendum)
Called  [facility]   [phone]     To Group Home  For information of legal guardian contact info. Staff provided name of Crystal [name] as legal guardian but unable to provide further information regarding phone number. Staff took this RN name and phone number for another staff member to return phone call.

## 2022-04-01 NOTE — ED Notes (Signed)
Crystal [name] Agricultural consultant able to deescalate pt situation and pt cooperative to come back into department and back in assigned bed. Pt cooperative at this time. [employer] notified about pt location

## 2022-04-01 NOTE — ED Notes (Signed)
Attempted to call  Delilah Shan   [place] Supervisor  No answer, left HIPPA compliant VM.

## 2022-04-01 NOTE — ED Notes (Signed)
Attempted to call,  joveterice,rubin Legal Guardian     [phone] dss   No answer-unable to leave VM

## 2022-04-01 NOTE — ED Notes (Signed)
Attempted to call, [name] [phone] ext [phone]. No answer
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
## 2022-04-01 NOTE — ED Notes (Signed)
This Probation officer spoke with Crystal [name], on call DSS personnel, cynthia updated on pt disposition and needs from Group home before pt returns, Caren Griffins stated she will contact Ms Truddie Coco and her supervisor about pt situation.
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
## 2022-04-02 LAB — SARS CORONAVIRUS 2 BY RT PCR: SARS Coronavirus 2 by RT PCR: NEGATIVE

## 2022-04-02 MED ORDER — HYDROXYZINE HCL 25 MG PO TABS
25.0000 mg | ORAL_TABLET | Freq: Four times a day (QID) | ORAL | Status: DC | PRN
  Administered 2022-04-02 – 2022-04-06 (×5): 25 mg via ORAL
  Filled 2022-04-02 (×5): qty 1

## 2022-04-02 MED ORDER — HYDROXYZINE HCL 25 MG PO TABS
100.0000 mg | ORAL_TABLET | Freq: Every day | ORAL | Status: DC
  Administered 2022-04-02 – 2022-04-05 (×3): 100 mg via ORAL
  Filled 2022-04-02 (×4): qty 4

## 2022-04-02 NOTE — ED Notes (Signed)
Patient visiting with other patients in dayroom. No noted distress or abnormal behaviors noted. Will continue 15 minute checks and observation by security camera for safety.

## 2022-04-02 NOTE — ED Notes (Signed)
Pt coming to nurse's station repeatedly asking various questions.

## 2022-04-02 NOTE — ED Notes (Signed)
Patient resting quietly in room. No noted distress or abnormal behaviors noted. Will continue 15 minute checks and observation by security camera for safety. 

## 2022-04-02 NOTE — ED Notes (Signed)
Per pt's legal guardian, patient cannot use the phone.

## 2022-04-02 NOTE — ED Notes (Signed)
Report received from Amy, Conservation officer, nature. Patient alert and oriented, warm and dry, in no acute distress. Patient denies SI, HI, AVH and pain. Patient made aware of Q15 minute rounds and security cameras for their safety. Patient instructed to come to this nurse with needs or concerns.

## 2022-04-02 NOTE — ED Provider Notes (Signed)
Emergency Medicine Observation Re-evaluation Note  Crystal [name] is a 31 y.o. female, seen on rounds today.  Pt initially presented to the ED for complaints of Psychiatric Evaluation and Suicidal Currently, the patient is calm, resting.  Physical Exam  BP 111/81 (BP Location: Left Arm)   Pulse 85   Temp 97.9 F (36.6 C) (Oral)   Resp 20   LMP  (Exact Date)   SpO2 98%    ED Course / MDM  EKG:   I have reviewed the labs performed to date as well as medications administered while in observation.  Recent changes in the last 24 hours include None.  Plan  Current plan is for TOC/SW Placement.  [name] is not under involuntary commitment.     Duffy Bruce, MD 04/02/22 [phone]
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
## 2022-04-02 NOTE — ED Notes (Addendum)
Ms Crystal [name] (group home owner) reports the pt has had increased anxiety and frequent mood swings the past month.

## 2022-04-02 NOTE — ED Notes (Signed)
RN attempted to call group home.  No answer.  [phone]

## 2022-04-02 NOTE — ED Notes (Signed)
Patient transferred from ED to [facility] room 4 after screening for contraband. Report received from [place], RN including Situation, Background, Assessment and Recommendations. Pt oriented to unit including Q15 minute rounds as well as the security cameras for their protection. Patient is alert and oriented, warm and dry in no acute distress. Patient denies HI, and AVH. Pt. Encouraged to let this nurse know if needs arise.
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
## 2022-04-02 NOTE — ED Notes (Addendum)
Legal guardian gave verbal consent for the patient to take Atarax for anxiety.

## 2022-04-02 NOTE — ED Notes (Signed)
Pt given nighttime snack. 

## 2022-04-03 NOTE — ED Notes (Signed)
VOL/Pending D/C on Monday

## 2022-04-03 NOTE — ED Notes (Signed)
VOL/pending dispo

## 2022-04-03 NOTE — ED Notes (Signed)
Report to include Situation, Background, Assessment, and Recommendations received from [facility]. Patient alert and oriented, warm and dry, in no acute distress. Patient denies SI, HI, AVH and pain. Patient made aware of Q15 minute rounds and security cameras for their safety. Patient instructed to come to me with needs or concerns.

## 2022-04-03 NOTE — ED Notes (Signed)
Snack and beverage given. 

## 2022-04-03 NOTE — ED Notes (Signed)
Pt assisted to call her [place]. Currently talking to her on the phone

## 2022-04-04 MED ORDER — ACETAMINOPHEN 325 MG PO TABS: 650.0000 mg | ORAL_TABLET | Freq: Four times a day (QID) | ORAL | Status: DC | PRN

## 2022-04-04 NOTE — ED Notes (Signed)
Pt given breakfast tray and drink 

## 2022-04-04 NOTE — ED Notes (Signed)
Pt given snack and drink at this time. 

## 2022-04-04 NOTE — ED Notes (Signed)
Snack and beverage given. 

## 2022-04-04 NOTE — ED Provider Notes (Signed)
Emergency Medicine Observation Re-evaluation Note  Crystal [name] is a 32 y.o. female, seen on rounds today.  Pt initially presented to the ED for complaints of Psychiatric Evaluation and Suicidal  Currently, the patient is resting in bed- no issues overnight per [name]. Is having some nightmares- requesting tylenol.  Physical Exam  Blood pressure 104/74, pulse 72, temperature 98.3 F (36.8 C), temperature source Oral, resp. rate 18, SpO2 97 %.  Physical Exam General: No apparent distress Pulm: Normal WOB Neuro: resting         ED Course / MDM     I have reviewed the labs performed to date as well as medications administered while in observation.  Recent changes in the last 24 hours include added on tylenol PRN per patient request.   Plan   Current plan is to continue to wait for placement  Patient is not under full IVC at this time.   Vanessa Coloma, MD 04/04/22 [phone]

## 2022-04-04 NOTE — ED Notes (Signed)
Report to include Situation, Background, Assessment, and Recommendations received from [facility]. Patient alert and oriented, warm and dry, in no acute distress. Patient denies SI, HI, AVH and pain. Patient made aware of Q15 minute rounds and security cameras for their safety. Patient instructed to come to me with needs or concerns.

## 2022-04-04 NOTE — ED Notes (Signed)
Pt given shower supplies, currently in shower. 

## 2022-04-04 NOTE — ED Notes (Signed)
VOL/pending discharge on Monday

## 2022-04-04 NOTE — ED Notes (Signed)
Pt given dinner tray and drink at this time. 

## 2022-04-04 NOTE — ED Notes (Signed)
Pt out of shower at this time. Pt requesting a comb and diet coke, this Probation officer allowed pt to use comb and return when done. No diet coke given, pt given ice water.

## 2022-04-05 NOTE — ED Notes (Signed)
VOL/pending D/C on 04/06/22

## 2022-04-05 NOTE — ED Notes (Signed)
Snack and beverage given. 

## 2022-04-05 NOTE — ED Notes (Signed)
Report received from Karie Fetch, Conservation officer, nature. On initial round after report Pt is warm/dry, resting quietly in room without any s/s of distress.  Will continue to monitor throughout shift as ordered for any changes in behaviors and for continued safety.

## 2022-04-05 NOTE — ED Notes (Signed)
Pt given dinner tray.

## 2022-04-05 NOTE — ED Notes (Signed)
Patient provided snack at appropriate snack time.  Pt consumed 100% of snack provided, tolerated well w/o complaints   Trash disposted of appropriately by patient.  

## 2022-04-05 NOTE — ED Notes (Signed)
Pt was given breakfast and a drink at this time.

## 2022-04-05 NOTE — ED Notes (Signed)
Report to include Situation, Background, Assessment, and Recommendations received from [facility]. Patient alert and oriented, warm and dry, in no acute distress. Patient denies SI, HI, AVH and pain. Patient made aware of Q15 minute rounds and security cameras for their safety. Patient instructed to come to me with needs or concerns.

## 2022-04-06 NOTE — ED Notes (Signed)
VOL PENDING D/C TOMMOROW 7/17

## 2022-04-06 NOTE — ED Notes (Signed)
Patient up to door and asking to take a shower, Tech is getting shower items together for her, she is calm and cooperative at this time.

## 2022-04-06 NOTE — ED Notes (Signed)
VOL PENDING D/C TODAY 7/17

## 2022-04-06 NOTE — ED Notes (Signed)
Patient's guardian called and said they will be here within hour to pick her up, Nurse did let Patient know due to her asking repeatedly that it would be today that she would be discharged. Patient seemed more calm and less anxious, Nurse will continue to monitor.

## 2022-04-06 NOTE — TOC Progression Note (Addendum)
Transition of Care [facility]) - Progression Note    Patient Details  Name: Crystal [name] MRN: [id number] Date of Birth: 10-22-1990  Transition of Care [facility]) CM/SW [place], [place] Phone Number: 04/06/2022, 12:20 PM  Clinical Narrative:     Update: Crystal called back, reports will be here in 1 hour to pick patient up and requests that dc paperwork be ready. CSW has informed Therapist, sports.     Per chart review patient's guardian will be coming to pick patient up today.   CSW has called Guardian Crystal [name]- at [phone] twice, no answer, lvm inquiring as to what time they will arrive. Pending call back .       Expected Discharge Plan and Services                                                 Social Determinants of Health (SDOH) Interventions    Readmission Risk Interventions     No data to display

## 2022-04-06 NOTE — ED Notes (Signed)
Breakfast given at 930

## 2022-04-06 NOTE — ED Provider Notes (Signed)
Emergency Medicine Observation Re-evaluation Note  Crystal [name] is a 31 y.o. female, seen on rounds today.   Physical Exam  BP 110/72 (BP Location: Left Arm)   Pulse 81   Temp 98.2 F (36.8 C) (Oral)   Resp 18   LMP  (Exact Date)   SpO2 97%  Physical Exam General: Patient resting comfortably in bed Lungs: Patient not in respiratory distress Psych: Patient not combative  ED Course / MDM  EKG:     Plan  Current plan is to continue to wait for placement.  Crystal [name] is not under involuntary commitment.     Nena Polio, MD 04/06/22 [phone]

## 2022-04-06 NOTE — ED Notes (Signed)
Patient showering at this time. 

## 2022-07-13 ENCOUNTER — Encounter: Payer: Self-pay | Admitting: Emergency Medicine

## 2022-07-13 ENCOUNTER — Emergency Department
Admission: EM | Admit: 2022-07-13 | Discharge: 2022-07-13 | Disposition: A | Discharge status: Home / Self Care | Payer: Medicaid Other | Attending: Emergency Medicine | Admitting: Emergency Medicine

## 2022-07-13 DIAGNOSIS — S0993XA Unspecified injury of face, initial encounter: Secondary | ICD-10-CM | POA: Diagnosis present

## 2022-07-13 DIAGNOSIS — S0181XA Laceration without foreign body of other part of head, initial encounter: Secondary | ICD-10-CM | POA: Insufficient documentation

## 2022-07-13 DIAGNOSIS — S01419A Laceration without foreign body of unspecified cheek and temporomandibular area, initial encounter: Secondary | ICD-10-CM

## 2022-07-13 DIAGNOSIS — S51819A Laceration without foreign body of unspecified forearm, initial encounter: Secondary | ICD-10-CM

## 2022-07-13 DIAGNOSIS — S51811A Laceration without foreign body of right forearm, initial encounter: Secondary | ICD-10-CM | POA: Diagnosis not present
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
## 2022-07-13 NOTE — ED Notes (Signed)
Pt given breakfast tray by ED tech.

## 2022-07-13 NOTE — ED Notes (Signed)
Lunch tray given to pt.
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
## 2022-07-13 NOTE — Discharge Instructions (Addendum)
Gently wash the wound with soap and water.  It is okay to shower, but do not submerge in a bath or go swimming as it is healing.  Do not vigorously scrub.   Gently pat dry.   Once dry, then apply Neosporin or bacitracin or even Vaseline ointment to the area to act as a barrier to help prevent infection.     

## 2022-07-13 NOTE — ED Notes (Signed)
This nurse spoke with pt guardian regarding plan of care for pt. Verbalized understanding of pt discharge and acknowledged pt wishes to move to a different group home.
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
## 2022-07-13 NOTE — ED Notes (Signed)
VM left for guardian regarding pt discharge.
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
## 2022-07-13 NOTE — ED Notes (Signed)
Pt given dinner tray.

## 2022-07-15 ENCOUNTER — Emergency Department
Admission: EM | Admit: 2022-07-15 | Discharge: 2022-07-16 | Disposition: A | Discharge status: Home / Self Care | Payer: Medicaid Other | Attending: Emergency Medicine | Admitting: Emergency Medicine

## 2022-07-15 ENCOUNTER — Encounter: Payer: Self-pay | Admitting: Emergency Medicine

## 2022-07-15 ENCOUNTER — Other Ambulatory Visit: Payer: Self-pay

## 2022-07-15 DIAGNOSIS — F331 Major depressive disorder, recurrent, moderate: Secondary | ICD-10-CM | POA: Insufficient documentation

## 2022-07-15 DIAGNOSIS — F4325 Adjustment disorder with mixed disturbance of emotions and conduct: Secondary | ICD-10-CM | POA: Diagnosis not present

## 2022-07-15 DIAGNOSIS — Z21 Asymptomatic human immunodeficiency virus [HIV] infection status: Secondary | ICD-10-CM | POA: Diagnosis not present

## 2022-07-15 DIAGNOSIS — X789XXA Intentional self-harm by unspecified sharp object, initial encounter: Secondary | ICD-10-CM | POA: Diagnosis not present

## 2022-07-15 DIAGNOSIS — Z85828 Personal history of other malignant neoplasm of skin: Secondary | ICD-10-CM | POA: Insufficient documentation

## 2022-07-15 DIAGNOSIS — F840 Autistic disorder: Secondary | ICD-10-CM | POA: Diagnosis not present

## 2022-07-15 DIAGNOSIS — R45851 Suicidal ideations: Secondary | ICD-10-CM

## 2022-07-15 DIAGNOSIS — F70 Mild intellectual disabilities: Secondary | ICD-10-CM | POA: Diagnosis present

## 2022-07-15 DIAGNOSIS — S61511A Laceration without foreign body of right wrist, initial encounter: Secondary | ICD-10-CM | POA: Diagnosis not present

## 2022-07-15 DIAGNOSIS — R4589 Other symptoms and signs involving emotional state: Secondary | ICD-10-CM

## 2022-07-15 DIAGNOSIS — Z20822 Contact with and (suspected) exposure to covid-19: Secondary | ICD-10-CM | POA: Diagnosis not present

## 2022-07-15 DIAGNOSIS — F17210 Nicotine dependence, cigarettes, uncomplicated: Secondary | ICD-10-CM | POA: Diagnosis not present

## 2022-07-15 DIAGNOSIS — S6991XA Unspecified injury of right wrist, hand and finger(s), initial encounter: Secondary | ICD-10-CM | POA: Diagnosis present

## 2022-07-15 DIAGNOSIS — B20 Human immunodeficiency virus [HIV] disease: Secondary | ICD-10-CM | POA: Diagnosis present

## 2022-07-15 LAB — COMPREHENSIVE METABOLIC PANEL
ALT: 12 U/L (ref 0–44)
AST: 13 U/L — ABNORMAL LOW (ref 15–41)
Albumin: 4.4 g/dL (ref 3.5–5.0)
Alkaline Phosphatase: 66 U/L (ref 38–126)
Anion gap: 9 (ref 5–15)
BUN: 18 mg/dL (ref 6–20)
CO2: 21 mmol/L — ABNORMAL LOW (ref 22–32)
Calcium: 9 mg/dL (ref 8.9–10.3)
Chloride: 109 mmol/L (ref 98–111)
Creatinine, Ser: 1.19 mg/dL — ABNORMAL HIGH (ref 0.44–1.00)
GFR, Estimated: >60 mL/min (ref >60)
Glucose, Bld: 122 mg/dL — ABNORMAL HIGH (ref 70–99)
Potassium: 3.4 mmol/L — ABNORMAL LOW (ref 3.5–5.1)
Sodium: 139 mmol/L (ref 135–145)
Total Bilirubin: 0.3 mg/dL (ref 0.3–1.2)
Total Protein: 7.5 g/dL (ref 6.5–8.1)

## 2022-07-15 LAB — URINE DRUG SCREEN, QUALITATIVE (ARMC ONLY)
Amphetamines, Ur Screen: NOT DETECTED
Barbiturates, Ur Screen: NOT DETECTED
Benzodiazepine, Ur Scrn: NOT DETECTED
Cannabinoid 50 Ng, Ur ~~LOC~~: NOT DETECTED
Cocaine Metabolite,Ur ~~LOC~~: NOT DETECTED
MDMA (Ecstasy)Ur Screen: NOT DETECTED
Methadone Scn, Ur: NOT DETECTED
Opiate, Ur Screen: NOT DETECTED
Phencyclidine (PCP) Ur S: NOT DETECTED
Tricyclic, Ur Screen: NOT DETECTED

## 2022-07-15 LAB — CBC
HCT: 37.8 % (ref 36.0–46.0)
Hemoglobin: 12.4 g/dL (ref 12.0–15.0)
MCH: 32.1 pg (ref 26.0–34.0)
MCHC: 32.8 g/dL (ref 30.0–36.0)
MCV: 97.9 fL (ref 80.0–100.0)
Platelets: 192 10*3/uL (ref 150–400)
RBC: 3.86 MIL/uL — ABNORMAL LOW (ref 3.87–5.11)
RDW: 11.9 % (ref 11.5–15.5)
WBC: 8.6 10*3/uL (ref 4.0–10.5)
nRBC: 0 % (ref 0.0–0.2)

## 2022-07-15 LAB — ETHANOL: Alcohol, Ethyl (B): <10 mg/dL (ref <10)

## 2022-07-15 LAB — SALICYLATE LEVEL: Salicylate Lvl: <7 mg/dL — ABNORMAL LOW (ref 7.0–30.0)

## 2022-07-15 LAB — ACETAMINOPHEN LEVEL: Acetaminophen (Tylenol), Serum: <10 ug/mL — ABNORMAL LOW (ref 10–30)

## 2022-07-15 NOTE — BH Assessment (Signed)
This writer attempted to reach pt's home; however there was no answer.
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
## 2022-07-16 DIAGNOSIS — F331 Major depressive disorder, recurrent, moderate: Secondary | ICD-10-CM | POA: Insufficient documentation

## 2022-07-16 LAB — SARS CORONAVIRUS 2 BY RT PCR: SARS Coronavirus 2 by RT PCR: NEGATIVE

## 2022-07-16 LAB — RESP PANEL BY RT-PCR (FLU A&B, COVID) ARPGX2
Influenza A by PCR: NEGATIVE
Influenza B by PCR: NEGATIVE
SARS Coronavirus 2 by RT PCR: NEGATIVE

## 2022-07-16 LAB — PREGNANCY, URINE: Preg Test, Ur: NEGATIVE

## 2022-07-16 MED ORDER — ONDANSETRON 4 MG PO TBDP: 4.0000 mg | ORAL_TABLET | Freq: Once | ORAL | Status: AC

## 2022-07-16 MED ORDER — ONDANSETRON 4 MG PO TBDP
ORAL_TABLET | ORAL | Status: AC
  Administered 2022-07-16: 4 mg via ORAL
  Filled 2022-07-16: qty 1

## 2022-07-16 NOTE — ED Notes (Signed)
joveterice,rub[place] Legal Guardian     [phone]   Attempt made to notify Legal guardian of patient being here in the ER.  HIPPA compliant message left for return call.

## 2022-07-16 NOTE — ED Notes (Signed)
This RN attempted to contact group home with no answer

## 2022-07-16 NOTE — ED Notes (Signed)
VOL/D/C in AM

## 2022-07-16 NOTE — ED Provider Notes (Signed)
Emergency Medicine Observation Re-evaluation Note  Georgina Krist is a 31 y.o. female, seen on rounds today.   Physical Exam  BP 106/64 (BP Location: Right Arm)   Pulse 88   Temp 98 F (36.7 C) (Oral)   Resp 18   Ht '5\' 1"'$  (1.549 m)   Wt 72.7 kg   SpO2 98%   BMI 30.28 kg/m  Physical Exam General: Patient resting comfortably in bed Lungs: Patient in no respiratory distress Psych: Patient not combative  ED Course / MDM  EKG:     Plan  Psychiatry seen the patient feels he is safe for discharge back to the group home follow-up with his providers there.    Nena Polio, MD 07/16/22 1052

## 2022-07-16 NOTE — Discharge Instructions (Addendum)
Return to the ER for worsening symptoms, feelings of hurting yourself or others, or other concerns.  Please continue medicines follow-up with your psychiatrist and therapist.

## 2022-07-16 NOTE — ED Notes (Signed)
This RN attempted to call group home with no answer again
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
## 2022-07-16 NOTE — ED Notes (Signed)
Discharged with Calla Kicks from group home. Left with all of belongings.
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
## 2022-07-16 NOTE — ED Notes (Signed)
Patient dressed out by this [employer]ar[employer] manager. Belonging bagged and locked in belongings room.  1 pair animal print pants 1 purple underwear 1 pair socks 1 black shirt

## 2022-07-18 ENCOUNTER — Emergency Department
Admission: EM | Admit: 2022-07-18 | Discharge: 2022-09-13 | Disposition: A | Discharge status: Home / Self Care | Payer: Medicaid Other | Attending: Emergency Medicine | Admitting: Emergency Medicine

## 2022-07-18 ENCOUNTER — Other Ambulatory Visit: Payer: Self-pay

## 2022-07-18 DIAGNOSIS — S61511A Laceration without foreign body of right wrist, initial encounter: Secondary | ICD-10-CM | POA: Insufficient documentation

## 2022-07-18 DIAGNOSIS — F17210 Nicotine dependence, cigarettes, uncomplicated: Secondary | ICD-10-CM | POA: Diagnosis not present

## 2022-07-18 DIAGNOSIS — Z609 Problem related to social environment, unspecified: Secondary | ICD-10-CM | POA: Insufficient documentation

## 2022-07-18 DIAGNOSIS — F840 Autistic disorder: Secondary | ICD-10-CM | POA: Diagnosis present

## 2022-07-18 DIAGNOSIS — Z139 Encounter for screening, unspecified: Secondary | ICD-10-CM | POA: Insufficient documentation

## 2022-07-18 DIAGNOSIS — Z20822 Contact with and (suspected) exposure to covid-19: Secondary | ICD-10-CM | POA: Insufficient documentation

## 2022-07-18 DIAGNOSIS — R109 Unspecified abdominal pain: Secondary | ICD-10-CM | POA: Insufficient documentation

## 2022-07-18 DIAGNOSIS — Z85828 Personal history of other malignant neoplasm of skin: Secondary | ICD-10-CM | POA: Diagnosis not present

## 2022-07-18 DIAGNOSIS — F419 Anxiety disorder, unspecified: Secondary | ICD-10-CM | POA: Diagnosis not present

## 2022-07-18 DIAGNOSIS — X789XXA Intentional self-harm by unspecified sharp object, initial encounter: Secondary | ICD-10-CM | POA: Diagnosis not present

## 2022-07-18 DIAGNOSIS — S6991XA Unspecified injury of right wrist, hand and finger(s), initial encounter: Secondary | ICD-10-CM | POA: Diagnosis present

## 2022-07-18 DIAGNOSIS — R45851 Suicidal ideations: Secondary | ICD-10-CM | POA: Diagnosis not present

## 2022-07-18 DIAGNOSIS — Z046 Encounter for general psychiatric examination, requested by authority: Secondary | ICD-10-CM

## 2022-07-18 DIAGNOSIS — S61519A Laceration without foreign body of unspecified wrist, initial encounter: Secondary | ICD-10-CM | POA: Diagnosis present

## 2022-07-18 DIAGNOSIS — F4325 Adjustment disorder with mixed disturbance of emotions and conduct: Secondary | ICD-10-CM | POA: Diagnosis not present

## 2022-07-18 DIAGNOSIS — Z21 Asymptomatic human immunodeficiency virus [HIV] infection status: Secondary | ICD-10-CM | POA: Diagnosis not present

## 2022-07-18 LAB — COMPREHENSIVE METABOLIC PANEL
ALT: 12 U/L (ref 0–44)
AST: 13 U/L — ABNORMAL LOW (ref 15–41)
Albumin: 4.3 g/dL (ref 3.5–5.0)
Alkaline Phosphatase: 64 U/L (ref 38–126)
Anion gap: 5 (ref 5–15)
BUN: 11 mg/dL (ref 6–20)
CO2: 26 mmol/L (ref 22–32)
Calcium: 9 mg/dL (ref 8.9–10.3)
Chloride: 109 mmol/L (ref 98–111)
Creatinine, Ser: 1.25 mg/dL — ABNORMAL HIGH (ref 0.44–1.00)
GFR, Estimated: 59 mL/min — ABNORMAL LOW (ref >60)
Glucose, Bld: 71 mg/dL (ref 70–99)
Potassium: 3.7 mmol/L (ref 3.5–5.1)
Sodium: 140 mmol/L (ref 135–145)
Total Bilirubin: 0.4 mg/dL (ref 0.3–1.2)
Total Protein: 7.4 g/dL (ref 6.5–8.1)

## 2022-07-18 LAB — RESP PANEL BY RT-PCR (FLU A&B, COVID) ARPGX2
Influenza A by PCR: NEGATIVE
Influenza B by PCR: NEGATIVE
SARS Coronavirus 2 by RT PCR: NEGATIVE

## 2022-07-18 LAB — CBC
HCT: 38.2 % (ref 36.0–46.0)
Hemoglobin: 12.4 g/dL (ref 12.0–15.0)
MCH: 31.6 pg (ref 26.0–34.0)
MCHC: 32.5 g/dL (ref 30.0–36.0)
MCV: 97.4 fL (ref 80.0–100.0)
Platelets: 186 10*3/uL (ref 150–400)
RBC: 3.92 MIL/uL (ref 3.87–5.11)
RDW: 12 % (ref 11.5–15.5)
WBC: 5.8 10*3/uL (ref 4.0–10.5)
nRBC: 0 % (ref 0.0–0.2)

## 2022-07-18 LAB — URINE DRUG SCREEN, QUALITATIVE (ARMC ONLY)
Amphetamines, Ur Screen: NOT DETECTED
Barbiturates, Ur Screen: NOT DETECTED
Benzodiazepine, Ur Scrn: NOT DETECTED
Cannabinoid 50 Ng, Ur ~~LOC~~: NOT DETECTED
Cocaine Metabolite,Ur ~~LOC~~: NOT DETECTED
MDMA (Ecstasy)Ur Screen: NOT DETECTED
Methadone Scn, Ur: NOT DETECTED
Opiate, Ur Screen: NOT DETECTED
Phencyclidine (PCP) Ur S: NOT DETECTED
Tricyclic, Ur Screen: NOT DETECTED

## 2022-07-18 LAB — SALICYLATE LEVEL: Salicylate Lvl: <7 mg/dL — ABNORMAL LOW (ref 7.0–30.0)

## 2022-07-18 LAB — ACETAMINOPHEN LEVEL: Acetaminophen (Tylenol), Serum: <10 ug/mL — ABNORMAL LOW (ref 10–30)

## 2022-07-18 LAB — POC URINE PREG, ED: Preg Test, Ur: NEGATIVE

## 2022-07-18 LAB — ETHANOL: Alcohol, Ethyl (B): <10 mg/dL (ref <10)

## 2022-07-18 MED ORDER — SERTRALINE HCL 100 MG PO TABS
200.0000 mg | ORAL_TABLET | Freq: Every day | ORAL | Status: DC
  Administered 2022-07-18 – 2022-09-12 (×57): 200 mg via ORAL
  Filled 2022-07-18 (×59): qty 2

## 2022-07-18 MED ORDER — DOLUTEGRAVIR SODIUM 50 MG PO TABS
50.0000 mg | ORAL_TABLET | Freq: Every day | ORAL | Status: DC
  Administered 2022-07-19 – 2022-09-12 (×56): 50 mg via ORAL
  Filled 2022-07-18 (×59): qty 1

## 2022-07-18 MED ORDER — BUSPIRONE HCL 10 MG PO TABS
30.0000 mg | ORAL_TABLET | Freq: Two times a day (BID) | ORAL | Status: DC
  Administered 2022-07-18 – 2022-09-12 (×114): 30 mg via ORAL
  Filled 2022-07-18 (×115): qty 3

## 2022-07-18 MED ORDER — RISPERIDONE 1 MG PO TABS
1.0000 mg | ORAL_TABLET | Freq: Two times a day (BID) | ORAL | Status: DC
  Administered 2022-07-18 – 2022-09-12 (×114): 1 mg via ORAL
  Filled 2022-07-18 (×115): qty 1

## 2022-07-18 MED ORDER — DOLUTEGRAVIR-LAMIVUDINE 50-300 MG PO TABS: 1.0000 | ORAL_TABLET | Freq: Every day | ORAL | Status: DC

## 2022-07-18 MED ORDER — LAMIVUDINE 150 MG PO TABS
300.0000 mg | ORAL_TABLET | Freq: Every day | ORAL | Status: DC
  Administered 2022-07-19 – 2022-09-12 (×55): 300 mg via ORAL
  Filled 2022-07-18 (×56): qty 2

## 2022-07-18 MED ORDER — ARIPIPRAZOLE 10 MG PO TABS: 20.0000 mg | ORAL_TABLET | Freq: Every day | ORAL | Status: DC
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
## 2022-07-18 NOTE — ED Notes (Signed)
Pt dressed out by this RN and [place] EDT  1 black shirt 1 gray bra 1 pair burgundy pants 1 pair pink underwear 1 pair black underwear 1 pair black socks  1 pair black shoes

## 2022-07-18 NOTE — ED Notes (Signed)
Pt given a snack 

## 2022-07-18 NOTE — BH Assessment (Signed)
Comprehensive Clinical Assessment (CCA) Screening, Triage and Referral Note  07/18/2022 Crystal [name] [id number]  Chief Complaint:  Chief Complaint  Patient presents with   Suicidal   Visit Diagnosis: Adjustment Disorder  Crystal [name] is a 31 year old female who presents to the ER, after she ran away from her Group Home. Patient has superficial cuts to her wrist. She is well known to the ER for similar behaviors, as attempts to move out her group home. Spoke with [employer] DSS Worker/Guardian (Crystal [name]) and she states, her group home will not take her back because of the recent increase of behaviors and safety concerns. However, if they are willing to take her until, they find another placement, the patient can return upon discharge. Writer spoke with the group home (Crystal [name]-[phone]) and she expressed concerns regarding the patient's behaviors. They also shared the patient cannot return because of it.  During the interview the patient was calm, cooperative and pleasant. During the interview, she was able to provide appropriate answers to the questions. She states she has always had suicidal thoughts and nothing isn't different from the other times. She also acknowledges she want to move to another group home.   Patient Reported Information How did you hear about [place]? Self  What Is the Reason for Your Visit/Call Today? Patient voicing SI  How Long Has This Been Causing You Problems? > than 6 months  What Do You Feel Would Help You the Most Today? Treatment for Depression or other mood problem   Have You Recently Had Any Thoughts About Hurting Yourself? No  Are You Planning to Commit Suicide/Harm Yourself At This time? No   Have you Recently Had Thoughts About [place]? No  Are You Planning to Harm Someone at This Time? No  Explanation: No data recorded  Have You Used Any Alcohol or Drugs in the Past 24 Hours? No  How Long Ago Did You Use Drugs  or Alcohol? No data recorded What Did You Use and How Much? Patient reports she smoked marijuana and drank alcohol.   Do You Currently Have a Therapist/Psychiatrist? Yes  Name of Therapist/Psychiatrist: Unknown   Have You Been Recently Discharged From Any Office Practice or Programs? No  Explanation of Discharge From Practice/Program: No data recorded   CCA Screening Triage Referral Assessment Type of Contact: Face-to-Face  Telemedicine Service Delivery:   Is this Initial or Reassessment? No data recorded Date Telepsych consult ordered in [facility]:  No data recorded Time Telepsych consult ordered in [facility]:  No data recorded Location of Assessment: [facility] ED  Provider Location: [facility] ED   Collateral Involvement: Spoke with DSS   Does Patient Have a [place]? No data recorded Name and Contact of Legal Guardian: No data recorded If Minor and Not Living with Parent(s), Who has Custody? N/A  Is CPS involved or ever been involved? Never  Is APS involved or ever been involved? Never   Patient Determined To Be At Risk for Harm To Self or Others Based on Review of Patient Reported Information or Presenting Complaint? No  Method: No data recorded Availability of Means: No data recorded Intent: No data recorded Notification Required: No data recorded Additional Information for Danger to Others Potential: No data recorded Additional Comments for Danger to Others Potential: No data recorded Are There Guns or Other Weapons in Your Home? No data recorded Types of Guns/Weapons: No data recorded Are These Weapons Safely Secured?  No data recorded Who Could Verify You Are Able To Have These Secured: No data recorded Do You Have any Outstanding Charges, Pending Court Dates, Parole/Probation? No data recorded Contacted To Inform of Risk of Harm To Self or Others: No data recorded  Does Patient Present under Involuntary Commitment? Yes  IVC  Papers Initial File Date: 07/18/22   [place] of Residence: [place]   Patient Currently Receiving the Following Services: Medication Management; Individual Therapy   Determination of Need: Emergent (2 hours)   Options For Referral: ED Visit   Discharge Disposition:    Crystal Fusi MS, LCAS, [facility], [facility] Therapeutic Triage Specialist 07/18/2022 11:38 AM

## 2022-07-18 NOTE — Consult Note (Cosign Needed Addendum)
[facility] Face-to-Face Psychiatry Consult   Reason for Consult:Suicidal  Referring Physician: EDP Patient Identification: Albina Gosney MRN:  [id number] Principal Diagnosis: Adjustment disorder with mixed disturbance of emotions and conduct Diagnosis:  Principal Problem:   Adjustment disorder with mixed disturbance of emotions and conduct Active Problems:   Autistic spectrum disorder   Self-inflicted laceration of wrist, initial encounter ([place])   Total Time spent with patient: 45 minutes  Subjective: "Always been like that since I was a kid," referring to suicidal ideations.  This is a 31 year old female in the ED for suicidal ideation.  The patient ran away from her group home with SI with plan to cut her wrists.  On exam patient has superficial scratches on her right wrist.  This client is well known to the ED and providers for similar presentations, IDD with low threshold for frustration.  She states that she is in the ED because "I am suicidal and "I've always been that way."  Patient states that she sees an outside therapist.  Jisel is at her baseline with no threat to herself or others, no psychosis.  At this time, her group home will not accept her back.    Past Psychiatric History:  Depression  Risk to Self:  none Risk to Others:  none Prior Inpatient Therapy: multiple times  Prior Outpatient Therapy:  yes  Past Medical History:  Past Medical History:  Diagnosis Date   Cancer ([place])    skin   Depression    HIV (human immunodeficiency virus infection) ([place])     Past Surgical History:  Procedure Laterality Date   SKIN SURGERY     Family History: No family history on file. Family Psychiatric  History: History reviewed. No pertinent family psychiatric history Social History:  Social History   Substance and Sexual Activity  Alcohol Use Not Currently     Social History   Substance and Sexual Activity  Drug Use Not Currently    Social History   Socioeconomic  History   Marital status: Single    Spouse name: Not on file   Number of children: Not on file   Years of education: Not on file   Highest education level: Not on file  Occupational History   Not on file  Tobacco Use   Smoking status: Every Day    Packs/day: 0.50    Years: 12.00    Total pack years: 6.00    Types: Cigarettes   Smokeless tobacco: Never  Vaping Use   Vaping Use: Never used  Substance and Sexual Activity   Alcohol use: Not Currently   Drug use: Not Currently   Sexual activity: Not on file  Other Topics Concern   Not on file  Social History Narrative   Not on file   Social Determinants of Health   Financial Resource Strain: Not on file  Food Insecurity: Not on file  Transportation Needs: Not on file  Physical Activity: Not on file  Stress: Not on file  Social Connections: Not on file   Additional Social History: discharged from the group home    Allergies:  No Known Allergies  Labs:  Results for orders placed or performed during the hospital encounter of 07/18/22 (from the past 48 hour(s))  Urine Drug Screen, Qualitative     Status: None   Collection Time: 07/18/22  8:22 AM  Result Value Ref Range   Tricyclic, Ur Screen NONE DETECTED NONE DETECTED   Amphetamines, Ur Screen NONE DETECTED NONE DETECTED  MDMA (Ecstasy)Ur Screen NONE DETECTED NONE DETECTED   Cocaine Metabolite,Ur [place] NONE DETECTED NONE DETECTED   Opiate, Ur Screen NONE DETECTED NONE DETECTED   Phencyclidine (PCP) Ur S NONE DETECTED NONE DETECTED   Cannabinoid 50 Ng, Ur [place] NONE DETECTED NONE DETECTED   Barbiturates, Ur Screen NONE DETECTED NONE DETECTED   Benzodiazepine, Ur Scrn NONE DETECTED NONE DETECTED   Methadone Scn, Ur NONE DETECTED NONE DETECTED    Comment: (NOTE) Tricyclics + metabolites, urine    Cutoff 1000 ng/mL Amphetamines + metabolites, urine  Cutoff 1000 ng/mL MDMA (Ecstasy), urine              Cutoff 500 ng/mL Cocaine Metabolite, urine          Cutoff 300  ng/mL Opiate + metabolites, urine        Cutoff 300 ng/mL Phencyclidine (PCP), urine         Cutoff 25 ng/mL Cannabinoid, urine                 Cutoff 50 ng/mL Barbiturates + metabolites, urine  Cutoff 200 ng/mL Benzodiazepine, urine              Cutoff 200 ng/mL Methadone, urine                   Cutoff 300 ng/mL  The urine drug screen provides only a preliminary, unconfirmed analytical test result and should not be used for non-medical purposes. Clinical consideration and professional judgment should be applied to any positive drug screen result due to possible interfering substances. A more specific alternate chemical method must be used in order to obtain a confirmed analytical result. Gas chromatography / mass spectrometry (GC/MS) is the preferred confirm atory method. Performed at [facility], [address] [place], [place] [address]   Comprehensive metabolic panel     Status: Abnormal   Collection Time: 07/18/22  8:33 AM  Result Value Ref Range   Sodium 140 135 - 145 mmol/L   Potassium 3.7 3.5 - 5.1 mmol/L   Chloride 109 98 - 111 mmol/L   CO2 26 22 - 32 mmol/L   Glucose, Bld 71 70 - 99 mg/dL    Comment: Glucose reference range applies only to samples taken after fasting for at least 8 hours.   BUN 11 6 - 20 mg/dL   Creatinine, Ser 1.25 (H) 0.44 - 1.00 mg/dL   Calcium 9.0 8.9 - 10.3 mg/dL   Total Protein 7.4 6.5 - 8.1 g/dL   Albumin 4.3 3.5 - 5.0 g/dL   AST 13 (L) 15 - 41 U/L   ALT 12 0 - 44 U/L   Alkaline Phosphatase 64 38 - 126 U/L   Total Bilirubin 0.4 0.3 - 1.2 mg/dL   GFR, Estimated 59 (L) >60 mL/min    Comment: (NOTE) Calculated using the CKD-EPI Creatinine Equation (2021)    Anion gap 5 5 - 15    Comment: Performed at [facility], [address] [place], [place] [address]  Ethanol     Status: None   Collection Time: 07/18/22  8:33 AM  Result Value Ref Range   Alcohol, Ethyl (B) <10 <10 mg/dL    Comment: (NOTE) Lowest detectable  limit for serum alcohol is 10 mg/dL.  For medical purposes only. Performed at [facility], [address] [place], [place] [address]   Salicylate level     Status: Abnormal   Collection Time: 07/18/22  8:33 AM  Result Value Ref  Range   Salicylate Lvl <0.3 (L) 7.0 - 30.0 mg/dL    Comment: Performed at [facility], [address] [place], [place] [address]  Acetaminophen level     Status: Abnormal   Collection Time: 07/18/22  8:33 AM  Result Value Ref Range   Acetaminophen (Tylenol), Serum <10 (L) 10 - 30 ug/mL    Comment: (NOTE) Therapeutic concentrations vary significantly. A range of 10-30 ug/mL  may be an effective concentration for many patients. However, some  are best treated at concentrations outside of this range. Acetaminophen concentrations >150 ug/mL at 4 hours after ingestion  and >50 ug/mL at 12 hours after ingestion are often associated with  toxic reactions.  Performed at [facility], [address] [place], [place] [address]   cbc     Status: None   Collection Time: 07/18/22  8:33 AM  Result Value Ref Range   WBC 5.8 4.0 - 10.5 K/uL   RBC 3.92 3.87 - 5.11 MIL/uL   Hemoglobin 12.4 12.0 - 15.0 g/dL   HCT 38.2 36.0 - 46.0 %   MCV 97.4 80.0 - 100.0 fL   MCH 31.6 26.0 - 34.0 pg   MCHC 32.5 30.0 - 36.0 g/dL   RDW 12.0 11.5 - 15.5 %   Platelets 186 150 - 400 K/uL   nRBC 0.0 0.0 - 0.2 %    Comment: Performed at [facility], [address] [place], [place] [address]  POC urine preg, ED     Status: None   Collection Time: 07/18/22  8:40 AM  Result Value Ref Range   Preg Test, Ur NEGATIVE NEGATIVE    Comment:        THE SENSITIVITY OF THIS METHODOLOGY IS >24 mIU/mL   Resp Panel by RT-PCR (Flu A&B, Covid) Anterior Nasal Swab     Status: None   Collection Time: 07/18/22  8:46 AM   Specimen: Anterior Nasal Swab  Result Value Ref Range   SARS Coronavirus 2 by RT PCR NEGATIVE NEGATIVE    Comment: (NOTE) SARS-CoV-2  target nucleic acids are NOT DETECTED.  The SARS-CoV-2 RNA is generally detectable in upper respiratory specimens during the acute phase of infection. The lowest concentration of SARS-CoV-2 viral copies this assay can detect is 138 copies/mL. A negative result does not preclude SARS-Cov-2 infection and should not be used as the sole basis for treatment or other patient management decisions. A negative result may occur with  improper specimen collection/handling, submission of specimen other than nasopharyngeal swab, presence of viral mutation(s) within the areas targeted by this assay, and inadequate number of viral copies(<138 copies/mL). A negative result must be combined with clinical observations, patient history, and epidemiological information. The expected result is Negative.  Fact Sheet for Patients:  EntrepreneurPulse.com.au  Fact Sheet for Healthcare Providers:  IncredibleEmployment.be  This test is no t yet approved or cleared by the [place] FDA and  has been authorized for detection and/or diagnosis of SARS-CoV-2 by FDA under an Emergency Use Authorization (EUA). This EUA will remain  in effect (meaning this test can be used) for the duration of the COVID-19 declaration under Section 564(b)(1) of the Act, 21 U.S.C.section 360bbb-3(b)(1), unless the authorization is terminated  or revoked sooner.       Influenza A by PCR NEGATIVE NEGATIVE   Influenza B by PCR NEGATIVE NEGATIVE    Comment: (NOTE) The Xpert Xpress SARS-CoV-2/FLU/RSV plus assay is intended as an aid in the diagnosis of influenza from Nasopharyngeal  swab specimens and should not be used as a sole basis for treatment. Nasal washings and aspirates are unacceptable for Xpert Xpress SARS-CoV-2/FLU/RSV testing.  Fact Sheet for Patients: EntrepreneurPulse.com.au  Fact Sheet for Healthcare Providers: IncredibleEmployment.be  This  test is not yet approved or cleared by the [place] FDA and has been authorized for detection and/or diagnosis of SARS-CoV-2 by FDA under an Emergency Use Authorization (EUA). This EUA will remain in effect (meaning this test can be used) for the duration of the COVID-19 declaration under Section 564(b)(1) of the Act, 21 U.S.C. section 360bbb-3(b)(1), unless the authorization is terminated or revoked.  Performed at [facility], [address] [place], [place] [address]     Current Facility-Administered Medications  Medication Dose Route Frequency Provider Last Rate Last Admin   ARIPiprazole (ABILIFY) tablet 20 mg  20 mg Oral Daily Patrecia Pour, NP       busPIRone (BUSPAR) tablet 30 mg  30 mg Oral BID Patrecia Pour, NP       dolutegravir-lamiVUDine (DOVATO) 50-300 MG per tablet 1 tablet  1 tablet Oral Daily Patrecia Pour, NP       sertraline (ZOLOFT) tablet 200 mg  200 mg Oral Daily Patrecia Pour, NP       Current Outpatient Medications  Medication Sig Dispense Refill   acetaminophen (TYLENOL) 325 MG tablet Take 650 mg by mouth every 6 (six) hours as needed.     ARIPiprazole (ABILIFY) 20 MG tablet Take 1 tablet (20 mg total) by mouth daily. (Patient not taking: Reported on 04/01/2022) 30 tablet 1   atorvastatin (LIPITOR) 20 MG tablet Take 20 mg by mouth daily.     busPIRone (BUSPAR) 30 MG tablet Take 1 tablet (30 mg total) by mouth 2 (two) times daily. 60 tablet 1   Cholecalciferol (VITAMIN D3) 1.25 MG (50000 UT) TABS Take 1 capsule by mouth once a week.     diphenhydrAMINE (BENADRYL) 2 % cream Apply 1 application topically 2 (two) times daily as needed for itching. (apply to feet) (Patient not taking: Reported on 04/01/2022) 30 g 0   docusate sodium (COLACE) 100 MG capsule Take 1 capsule (100 mg total) by mouth 2 (two) times daily. 60 capsule 1   dolutegravir-lamiVUDine (DOVATO) 50-300 MG tablet Take 1 tablet by mouth daily. 30 tablet 5   hydrOXYzine (VISTARIL)  100 MG capsule Take 1 capsule (100 mg total) by mouth at bedtime. (Patient not taking: Reported on 04/01/2022) 30 capsule 1   linaclotide (LINZESS) 145 MCG CAPS capsule Take 145 mcg by mouth daily before breakfast.     medroxyPROGESTERone (DEPO-PROVERA) 150 MG/ML injection Inject 150 mg into the muscle every 3 (three) months.     melatonin 3 MG TABS tablet Take 2 tablets (6 mg total) by mouth at bedtime. (Patient not taking: Reported on 04/01/2022) 60 tablet 1   prazosin (MINIPRESS) 2 MG capsule Take 2 mg by mouth at bedtime.     risperiDONE ER (PERSERIS) 120 MG PRSY Inject 1 Syringe into the skin every 30 (thirty) days.     sertraline (ZOLOFT) 100 MG tablet Take 1.5 tablets (150 mg total) by mouth daily. (Patient taking differently: Take 200 mg by mouth daily. 2 tabs) 45 tablet 1   Suvorexant (BELSOMRA) 10 MG TABS Take 1 tablet by mouth at bedtime.     vitamin B-12 (CYANOCOBALAMIN) 1000 MCG tablet Take 1,000 mcg by mouth daily.      Musculoskeletal: Strength & Muscle Tone: within normal limits  Gait & Station: normal Patient leans: N/A  Psychiatric Specialty Exam: Physical Exam Vitals and nursing note reviewed.  Constitutional:      Appearance: Normal appearance.  HENT:     Head: Normocephalic.     Nose: Nose normal.  Pulmonary:     Effort: Pulmonary effort is normal.  Musculoskeletal:        General: Normal range of motion.     Cervical back: Normal range of motion and neck supple.  Neurological:     General: No focal deficit present.     Mental Status: She is alert and oriented to person, place, and time.  Psychiatric:        Attention and Perception: Attention and perception normal.        Mood and Affect: Mood is anxious.        Speech: Speech normal.        Behavior: Behavior normal. Behavior is cooperative.        Thought Content: Thought content normal.        Cognition and Memory: Cognition and memory normal.        Judgment: Judgment is impulsive.     Review of  Systems  Psychiatric/Behavioral:  The patient is nervous/anxious.   All other systems reviewed and are negative.   Blood pressure 95/68, pulse 78, temperature 98 F (36.7 C), resp. rate 19, height '5\' 1"'$  (1.549 m), weight 72.6 kg, SpO2 100 %.Body mass index is 30.23 kg/m.  General Appearance: Disheveled  Eye Contact:  Fair  Speech:  Normal Rate  Volume:  Normal  Mood:  Anxious  Affect:  Flat  Thought Process:  Logical  Orientation:  Full (Time, Place, and Person)  Thought Content:  Logical  Suicidal Thoughts:  No  Homicidal Thoughts:  No  Memory:  Immediate;   Fair Recent;   Fair Remote;   Fair  Judgement:  Fair  Insight:  Fair  Psychomotor Activity:  Normal  Concentration:  Concentration: Fair and Attention Span: Fair  Recall:  [employer] of Knowledge:  Fair  Language:  Good  Akathisia:  No  Handed:  Right  AIMS (if indicated):     Assets:  Housing  ADL's:  Intact  Cognition:  WNL  Sleep:         Physical Exam: Physical Exam Vitals and nursing note reviewed.  Constitutional:      Appearance: Normal appearance.  HENT:     Head: Normocephalic.     Nose: Nose normal.  Pulmonary:     Effort: Pulmonary effort is normal.  Musculoskeletal:        General: Normal range of motion.     Cervical back: Normal range of motion and neck supple.  Neurological:     General: No focal deficit present.     Mental Status: She is alert and oriented to person, place, and time.  Psychiatric:        Attention and Perception: Attention and perception normal.        Mood and Affect: Mood is anxious.        Speech: Speech normal.        Behavior: Behavior normal. Behavior is cooperative.        Thought Content: Thought content normal.        Cognition and Memory: Cognition and memory normal.        Judgment: Judgment is impulsive.    Review of Systems  Psychiatric/Behavioral:  The patient is nervous/anxious.   All other systems  reviewed and are negative.  Blood pressure 95/68,  pulse 78, temperature 98 F (36.7 C), resp. rate 19, height '5\' 1"'$  (1.549 m), weight 72.6 kg, SpO2 100 %. Body mass index is 30.23 kg/m.  Treatment Plan Summary: Adjustment disorder with mixed disturbance of emotions and conduct: Risperdal 1 mg BID  Anxiety: Buspar 30 mg BID  Depression: Zoloft 200 mg daily   Disposition: No evidence of imminent risk to self or others at present.   Patient does not meet criteria for psychiatric inpatient admission. Supportive therapy provided about ongoing stressors. Discussed crisis plan, support from social network, calling 911, coming to the Emergency Department, and calling Suicide Hotline.  Waylan Boga, NP 07/18/2022 11:07 AM
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
## 2022-07-19 MED ORDER — ACETAMINOPHEN 325 MG PO TABS: 650.0000 mg | ORAL_TABLET | Freq: Once | ORAL | Status: DC

## 2022-07-19 MED ORDER — ACETAMINOPHEN 325 MG PO TABS
650.0000 mg | ORAL_TABLET | Freq: Four times a day (QID) | ORAL | Status: DC | PRN
  Administered 2022-07-19 – 2022-09-12 (×69): 650 mg via ORAL
  Filled 2022-07-19 (×70): qty 2

## 2022-07-19 NOTE — ED Notes (Signed)
Report received from Crystal RN

## 2022-07-19 NOTE — ED Notes (Signed)
Snack and drink given

## 2022-07-19 NOTE — ED Notes (Signed)
Pt given breakfast tray and drink at this time. 

## 2022-07-19 NOTE — ED Notes (Signed)
Pt given printer paper to draw on per her request.  Pt stated she had a pen. This RN checked pen and it was BHU appropriate.

## 2022-07-19 NOTE — ED Provider Notes (Signed)
Emergency Medicine Observation Re-evaluation Note  Crystal [name] is a 31 y.o. female, seen on rounds today.  Pt initially presented to the ED for complaints of Suicidal  Currently, the patient is asleep- no BHU nurse concerns   Physical Exam  Blood pressure 96/71, pulse 72, temperature 97.8 F (36.6 C), temperature source Oral, resp. rate 18, height '5\' 1"'$  (1.549 m), weight 72.6 kg, SpO2 98 %.  Physical Exam General: No apparent distress Pulm: Normal WOB Psych: resting     ED Course / MDM     I have reviewed the labs performed to date as well as medications administered while in observation.  Recent changes in the last 24 hours include none   Plan   Current plan is to continue to wait for [facility]  Patient is not under full IVC at this time.   Vanessa [place], MD 07/19/22 [phone]

## 2022-07-19 NOTE — ED Notes (Signed)
When asked if she any thoughts of hurting herself, pt stated that she does not while she is here because she feels safe but she thinks she might hurt herself when she is D/C.

## 2022-07-19 NOTE — ED Notes (Signed)
Patient is IVC pending placement 

## 2022-07-19 NOTE — ED Notes (Signed)
Pt complaint of headache. MD Jari Pigg made aware and verbal orders for '650mg'$  of tylenol given.

## 2022-07-19 NOTE — ED Notes (Signed)
This RN received a phone call from someone named Crystal [name] from [facility] services who just received a phone call earlier from this patient requesting a place to live. Crystal [name] asked me to give the patient a message of them not having a safe bed at their facility to accept her and to please not call like that. This RN then educated the patient on proper phone use and phone calls. And that she needed to not make calls like that and to be patient and allow our social work team to find proper placement for her.

## 2022-07-19 NOTE — ED Notes (Signed)
Pt requested to see a chaplain. Will have secretary page them.

## 2022-07-19 NOTE — ED Notes (Signed)
Chaplain here to see patient.

## 2022-07-20 NOTE — ED Notes (Signed)
Pt given water and used the bathroom.

## 2022-07-20 NOTE — ED Notes (Signed)
Breakfast tray given to pt 

## 2022-07-20 NOTE — ED Notes (Signed)
Pt IVC/ Pending Placement

## 2022-07-20 NOTE — ED Notes (Signed)
Chaplain visiting with pt in day room.

## 2022-07-20 NOTE — ED Notes (Addendum)
Dinner provided with beverage.

## 2022-07-20 NOTE — ED Notes (Signed)
Shower supplies given to pt. Pt showering at this time 
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
## 2022-07-20 NOTE — ED Notes (Signed)
Lunch provided with beverage

## 2022-07-21 NOTE — ED Notes (Signed)
Pt given snack. 

## 2022-07-21 NOTE — TOC Progression Note (Signed)
Transition of Care [facility]) - Progression Note    Patient Details  Name: Crystal [name] MRN: [id number] Date of Birth: 06-Apr-1991  Transition of Care [facility]) CM/SW Contact  Shelbie Hutching, RN Phone Number: 07/21/2022, 3:18 PM  Clinical Narrative:    Received a call from [facility] the [facility] Case worker- [phone], she will be working on finding appropriate placement for patient.  She is aware that patient is not admitted and is not being recommended for inpatient psych treatment at this time.  Guardian was thinking that patient may need locked facility, [name] will have to find an outside provider to come in to do a comprehensive assessment and psychiatric recommendation.     Expected Discharge Plan:  (TBD- patient needs higher level than group home) Barriers to Discharge: ED Unsafe disposition  Expected Discharge Plan and Services Expected Discharge Plan:  (TBD- patient needs higher level than group home) In-house Referral: Chaplain Discharge Planning Services: CM Consult   Living arrangements for the past 2 months: Group Home                 DME Arranged: N/A DME Agency: NA       HH Arranged: NA HH Agency: NA         Social Determinants of Health (SDOH) Interventions    Readmission Risk Interventions     No data to display

## 2022-07-21 NOTE — ED Notes (Signed)
Resumed care from annie rn.  Pt alert, pt in dayroom with other patients.  No acute distress.

## 2022-07-21 NOTE — ED Notes (Signed)
Pt given meal tray.

## 2022-07-21 NOTE — ED Notes (Signed)
Dinner meal given to pt

## 2022-07-21 NOTE — ED Provider Notes (Addendum)
Emergency Medicine Observation Re-evaluation Note  Crystal [name] is a 31 y.o. female, seen on rounds today.  Pt initially presented to the ED for complaints of Suicidal Currently, the patient is ambulating in the day room and there have been no acute issues per the [facility].  Physical Exam  BP 103/66 (BP Location: Right Arm)   Pulse 68   Temp 97.8 F (36.6 C) (Oral)   Resp 18   Ht '5\' 1"'$  (1.549 m)   Wt 72.6 kg   SpO2 99%   BMI 30.23 kg/m  Physical Exam General: No distress Pulm: Normal effort Psych: Calm and cooperative  ED Course / MDM   I have reviewed the labs performed to date as well as medications administered while in observation.  There have been no acute changes to her status in the last 24 hours.  Plan  Current plan is for TOC placement.       Arta Silence, MD 07/21/22 [phone]

## 2022-07-21 NOTE — ED Notes (Signed)

## 2022-07-22 MED ORDER — MELATONIN 5 MG PO TABS
5.0000 mg | ORAL_TABLET | Freq: Every day | ORAL | Status: DC
  Administered 2022-07-22 – 2022-09-12 (×53): 5 mg via ORAL
  Filled 2022-07-22 (×53): qty 1

## 2022-07-22 NOTE — ED Notes (Signed)
Pt given snack. 

## 2022-07-22 NOTE — ED Notes (Signed)
Pt given nighttime snack. 

## 2022-07-22 NOTE — ED Notes (Signed)
Patient is calm and cooperative, she does repeat same questions over and over, no behavioral issues noted, she took all of her medications that were ordered, will continue to monitor.

## 2022-07-22 NOTE — ED Notes (Signed)
IVC/Pending New Group Home Placement

## 2022-07-22 NOTE — ED Notes (Signed)
IVC pending placement 

## 2022-07-22 NOTE — ED Provider Notes (Signed)
Emergency Medicine Observation Re-evaluation Note  Crystal [name] is a 31 y.o. female, seen on rounds today.  Pt initially presented to the ED for complaints of Suicidal  Currently, the patient is calm, no acute complaints.  Physical Exam  Blood pressure 98/76, pulse 72, temperature (!) 97.5 F (36.4 C), temperature source Oral, resp. rate 18, height '5\' 1"'$  (1.549 m), weight 72.6 kg, SpO2 100 %. Physical Exam General: NAD Lungs: CTAB Psych: not agitated  ED Course / MDM  EKG:    I have reviewed the labs performed to date as well as medications administered while in observation.  Recent changes in the last 24 hours include no acute events overnight.    Plan  Current plan is for TOC dispo - seeking locked unit residential facility. Patient is under full IVC at this time.   Carrie Mew, MD 07/22/22 [phone]

## 2022-07-23 NOTE — ED Notes (Signed)
IVC pending placement 

## 2022-07-23 NOTE — ED Provider Notes (Signed)
Emergency Medicine Observation Re-evaluation Note  Gail Vendetti is a 31 y.o. female, seen on rounds today.  Pt initially presented to the ED for complaints of Suicidal   Physical Exam  BP 98/72 (BP Location: Left Arm)   Pulse 76   Temp 97.7 F (36.5 C) (Oral)   Resp 16   Ht '5\' 1"'$  (1.549 m)   Wt 72.6 kg   SpO2 97%   BMI 30.23 kg/m  Physical Exam General: NAd   ED Course / MDM  EKG:EKG Interpretation  Date/Time:  Saturday July 18 2022 09:31:52 EDT Ventricular Rate:  63 PR Interval:  168 QRS Duration: 70 QT Interval:  392 QTC Calculation: 401 R Axis:   65 Text Interpretation: Normal sinus rhythm with sinus arrhythmia Normal ECG No previous ECGs available Confirmed by UNCONFIRMED, DOCTOR ([id number]), editor Dwaine Deter ([id number]) on 07/20/2022 11:12:51 AM  I have reviewed the labs performed to date as well as medications administered while in observation.  Recent changes in the last 24 hours include none.  Plan  Current plan is for [facility] dispo to locked unit residential facility    Merlyn Lot, MD 07/23/22 [phone]

## 2022-07-23 NOTE — ED Notes (Signed)
Pt sitting in dayroom. Breakfast and beverage provided. Pt asking what the plan is for her care and states she was told she was going to be admitted. Reassured there were no notes from her provider that indicated she would be admitted to the hospital. Pt verbalized understanding.

## 2022-07-23 NOTE — ED Notes (Signed)
Meal tray given 

## 2022-07-23 NOTE — ED Notes (Signed)
Pt ambulated to restroom with steady gait.

## 2022-07-23 NOTE — ED Notes (Signed)
Lunch tray given. 

## 2022-07-23 NOTE — ED Notes (Signed)
IVC/Pending placement 

## 2022-07-23 NOTE — ED Notes (Signed)
Pt given shower supplies and shower room unlocked.

## 2022-07-24 NOTE — ED Notes (Signed)
Pt given supper box and drink

## 2022-07-24 NOTE — ED Notes (Signed)
Pt given nighttime snack. 

## 2022-07-24 NOTE — ED Notes (Signed)
Pt given meal tray.

## 2022-07-24 NOTE — ED Notes (Signed)
Pt set up for and took shower.

## 2022-07-24 NOTE — ED Notes (Signed)
IVC toc placement papers expire 11/4

## 2022-07-24 NOTE — ED Notes (Signed)
Pt complained of HA

## 2022-07-24 NOTE — TOC Progression Note (Signed)
Transition of Care [facility]) - Progression Note    Patient Details  Name: Crystal [name] MRN: [id number] Date of Birth: [birth year]  Transition of Care [facility]) CM/SW Contact  Shelbie Hutching, RN Phone Number: 07/24/2022, 4:06 PM  Clinical Narrative:    Spoke with patient's guardian [name], she reports that [place] with [facility] is working on sending our referrals for placement.  Guardian asks that the patient not have phone privileges.   RNCM will cont to touch base with guardian and [facility] several times each week and update nursing staff and patient.      Expected Discharge Plan:  (TBD- patient needs higher level than group home) Barriers to Discharge: ED Unsafe disposition  Expected Discharge Plan and Services Expected Discharge Plan:  (TBD- patient needs higher level than group home) In-house Referral: Chaplain Discharge Planning Services: CM Consult   Living arrangements for the past 2 months: Group Home                 DME Arranged: N/A DME Agency: NA       HH Arranged: NA HH Agency: NA         Social Determinants of Health (SDOH) Interventions    Readmission Risk Interventions     No data to display

## 2022-07-24 NOTE — ED Provider Notes (Signed)
Emergency Medicine Observation Re-evaluation Note  Crystal [name] is a 31 y.o. female, seen on rounds today.  Pt initially presented to the ED for complaints of Suicidal Currently, the patient is sleeping in bed, currently requesting a double portion at meal time.  Physical Exam  BP 99/73 (BP Location: Right Arm)   Pulse 97   Temp 97.8 F (36.6 C) (Oral)   Resp 18   Ht '5\' 1"'$  (1.549 m)   Wt 72.6 kg   SpO2 97%   BMI 30.23 kg/m  Physical Exam Constitutional: Resting comfortably. Eyes: Conjunctivae are normal. Head: Atraumatic. Nose: No congestion/rhinnorhea. Mouth/Throat: Mucous membranes are moist. Neck: Normal ROM Cardiovascular: No cyanosis noted. Respiratory: Normal respiratory effort. Gastrointestinal: Non-distended. Genitourinary: deferred Musculoskeletal: No lower extremity tenderness nor edema. Neurologic:  Normal speech and language. No gross focal neurologic deficits are appreciated. Skin:  Skin is warm, dry and intact. No rash noted.   ED Course / MDM  EKG:EKG Interpretation  Date/Time:  Saturday July 18 2022 09:31:52 EDT Ventricular Rate:  63 PR Interval:  168 QRS Duration: 70 QT Interval:  392 QTC Calculation: 401 R Axis:   65 Text Interpretation: Normal sinus rhythm with sinus arrhythmia Normal ECG No previous ECGs available Confirmed by UNCONFIRMED, DOCTOR ([id number]), editor Dwaine Deter ([id number]) on 07/20/2022 11:12:51 AM  I have reviewed the labs performed to date as well as medications administered while in observation.  Recent changes in the last 24 hours include none.  Plan  Current plan is for dispo per social work.    Blake Divine, MD 07/24/22 1157

## 2022-07-24 NOTE — ED Notes (Signed)
Pt given breakfast box. 

## 2022-07-25 NOTE — ED Notes (Signed)
IVC/pending placement/IVC papers expire 11/4 @ 930AM

## 2022-07-25 NOTE — ED Provider Notes (Signed)
Emergency Medicine Observation Re-evaluation Note  Crystal [name] is a 31 y.o. female, seen on rounds today.  Pt initially presented to the ED for complaints of Suicidal   Physical Exam  BP 104/68 (BP Location: Left Arm)   Pulse 74   Temp 97.9 F (36.6 C)   Resp 16   Ht '5\' 1"'$  (1.549 m)   Wt 72.6 kg   SpO2 98%   BMI 30.23 kg/m  Physical Exam General: no acute distress Lungs: normal wob Psych: calm  ED Course / MDM   I have reviewed the labs performed to date as well as medications administered while in observation.  Recent changes in the last 24 hours include none.  Plan  Current plan is for SW disposition.   I have not renewed IVC paper work, as patient has been cleared by psychiatry.     Rada Hay, MD 07/25/22 [phone]

## 2022-07-25 NOTE — ED Notes (Signed)
Pt given nighttime snack. 

## 2022-07-26 MED ORDER — LORATADINE 10 MG PO TABS
10.0000 mg | ORAL_TABLET | Freq: Every day | ORAL | Status: DC | PRN
  Administered 2022-07-26 – 2022-09-12 (×10): 10 mg via ORAL
  Filled 2022-07-26 (×10): qty 1

## 2022-07-26 NOTE — ED Notes (Signed)
Pt given nighttime snack. 

## 2022-07-26 NOTE — ED Notes (Signed)
VOL/Pending TOC Placement 

## 2022-07-26 NOTE — ED Provider Notes (Signed)
Emergency Medicine Observation Re-evaluation Note  Crystal [name] is a 31 y.o. female, seen on rounds today.  Pt initially presented to the ED for complaints of Suicidal Currently, the patient is calm,r esting.  Physical Exam  BP 98/72 (BP Location: Left Arm)   Pulse 93   Temp 98.2 F (36.8 C) (Oral)   Resp 20   Ht '5\' 1"'$  (1.549 m)   Wt 72.6 kg   SpO2 98%   BMI 30.23 kg/m   ED Course / MDM  EKG:EKG Interpretation  Date/Time:  Saturday July 18 2022 09:31:52 EDT Ventricular Rate:  63 PR Interval:  168 QRS Duration: 70 QT Interval:  392 QTC Calculation: 401 R Axis:   65 Text Interpretation: Normal sinus rhythm with sinus arrhythmia Normal ECG No previous ECGs available Confirmed by UNCONFIRMED, DOCTOR ([id number]), editor Dwaine Deter ([id number]) on 07/20/2022 11:12:51 AM  I have reviewed the labs performed to date as well as medications administered while in observation.  Recent changes in the last 24 hours include none.  Plan  Current plan is for psychiatric disposition.    Duffy Bruce, MD 07/26/22 1023

## 2022-07-27 MED ORDER — ALUM & MAG HYDROXIDE-SIMETH 200-200-20 MG/5ML PO SUSP
30.0000 mL | Freq: Once | ORAL | Status: AC
  Administered 2022-07-27: 30 mL via ORAL
  Filled 2022-07-27: qty 30

## 2022-07-27 NOTE — ED Notes (Signed)

## 2022-07-27 NOTE — ED Notes (Signed)
VOL/Pending placement 

## 2022-07-27 NOTE — ED Notes (Signed)
Dinner meal given to pt

## 2022-07-27 NOTE — TOC Progression Note (Signed)
Transition of Care [facility]) - Progression Note    Patient Details  Name: Crystal [name] MRN: [id number] Date of Birth: 1991/07/03  Transition of Care [facility]) CM/SW Contact  Shelbie Hutching, RN Phone Number: 07/27/2022, 2:54 PM  Clinical Narrative:    RNCM spoke with [name], case manager with [facility], [phone].  [name] has sent out referrals but has gotten only denials at this time.  She is looking for a provider to come and do a psychological assessment with updated placement recommendations.     Expected Discharge Plan:  (TBD- patient needs higher level than group home) Barriers to Discharge: ED Unsafe disposition  Expected Discharge Plan and Services Expected Discharge Plan:  (TBD- patient needs higher level than group home) In-house Referral: Chaplain Discharge Planning Services: CM Consult   Living arrangements for the past 2 months: Group Home                 DME Arranged: N/A DME Agency: NA       HH Arranged: NA HH Agency: NA         Social Determinants of Health (SDOH) Interventions    Readmission Risk Interventions     No data to display

## 2022-07-27 NOTE — ED Notes (Signed)
Pt given snack. 

## 2022-07-27 NOTE — ED Provider Notes (Signed)
Emergency Medicine Observation Re-evaluation Note  Crystal [name] is a 31 y.o. female, seen on rounds today.  Pt initially presented to the ED for complaints of Suicidal   Physical Exam  BP 106/73 (BP Location: Right Arm)   Pulse 78   Temp 97.9 F (36.6 C) (Oral)   Resp 18   Ht '5\' 1"'$  (1.549 m)   Wt 72.6 kg   SpO2 98%   BMI 30.23 kg/m  Physical Exam General: Ambulating, frustrated about not being transferred yet.  ED Course / MDM  EKG:EKG Interpretation  Date/Time:  Saturday July 18 2022 09:31:52 EDT Ventricular Rate:  63 PR Interval:  168 QRS Duration: 70 QT Interval:  392 QTC Calculation: 401 R Axis:   65 Text Interpretation: Normal sinus rhythm with sinus arrhythmia Normal ECG No previous ECGs available Confirmed by UNCONFIRMED, DOCTOR ([id number]), editor Dwaine Deter ([id number]) on 07/20/2022 11:12:51 AM  I have reviewed the labs performed to date as well as medications administered while in observation.  Recent changes in the last 24 hours include awaiting placement.  Plan  Current plan is for awaiting placement.    Nathaniel Man, MD 07/27/22 1046

## 2022-07-27 NOTE — ED Notes (Signed)
VOL  PENDING  PLACEMENT 

## 2022-07-28 NOTE — ED Notes (Signed)
Vol /pending placement 

## 2022-07-28 NOTE — ED Notes (Signed)
Patient in restroom.

## 2022-07-28 NOTE — ED Notes (Signed)
Patient pacing back and fourth
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
## 2022-07-28 NOTE — Progress Notes (Signed)
   07/28/22 1500  Clinical Encounter Type  Visited With Patient  Visit Type Initial  Referral From Nurse  Consult/Referral To Chaplain  Spiritual Encounters  Spiritual Needs Emotional   Chaplain responded to call to provide emotional and spiritual support. Chaplain assisted through compassionate presence and reflective listening to patient's life story.

## 2022-07-28 NOTE — ED Notes (Signed)
Patient came out of room and said" I have done something bad" nurse ask what and she showed me her wall in her room and she had written all over the wall several obscenities, she was told to give the nurse the crayon and she refused, so the security guard and Cna obtained the crayon, she is being calm and just keeps saying " I want to leave and go to another hospital, Nurse let her know that it was not a possibility, because she is on a waiting list for placement and she does not meet the criteria for treatment due too behaviors, and that she was making things worse for herself. Patient will be monitored, she is safe, camera surveillance in progress and q 15 minute checks .

## 2022-07-28 NOTE — ED Notes (Addendum)
Patient threw her meal in the trash and threw her water across the floor, Stating I'm demanding to go to another hospital Now!

## 2022-07-28 NOTE — ED Notes (Signed)
Patient is calm and cooperative, chaplin is talking with her and she is being pleasant, no behavioral issues, she is safe. Staff will continue to monitor.

## 2022-07-28 NOTE — ED Provider Notes (Signed)
Emergency Medicine Observation Re-evaluation Note  Crystal [name] is a 31 y.o. female, seen on rounds today.   Physical Exam  BP (!) 97/58 (BP Location: Left Arm)   Pulse 93   Temp 99 F (37.2 C) (Oral)   Resp 20   Ht '5\' 1"'$  (1.549 m)   Wt 72.6 kg   SpO2 96%   BMI 30.23 kg/m  Physical Exam General: Patient resting comfortably in bed Lungs: Patient distress Psych: Patient not  ED Course / MDM  EKG:  Plan  Current plan is for placement.  We will continue monitoring blood pressure.    Nena Polio, MD 07/28/22 0730

## 2022-07-28 NOTE — ED Notes (Signed)
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
## 2022-07-28 NOTE — ED Notes (Signed)
Dinner taken to pt. At 4:25pm

## 2022-07-29 MED ORDER — ALUM & MAG HYDROXIDE-SIMETH 200-200-20 MG/5ML PO SUSP
30.0000 mL | Freq: Once | ORAL | Status: AC
  Administered 2022-07-29: 30 mL via ORAL

## 2022-07-29 NOTE — ED Provider Notes (Signed)
Emergency Medicine Observation Re-evaluation Note  Crystal [name] is a 31 y.o. female, seen on rounds today.  Pt initially presented to the ED for complaints of Suicidal  Currently, the patient is calm, no acute complaints.  Physical Exam  Blood pressure 101/67, pulse 80, temperature (!) 97.3 F (36.3 C), temperature source Oral, resp. rate 20, height '5\' 1"'$  (1.549 m), weight 72.6 kg, SpO2 98 %. Physical Exam General: NAD Lungs: CTAB Psych: not agitated  ED Course / MDM  EKG:    I have reviewed the labs performed to date as well as medications administered while in observation.  Recent changes in the last 24 hours include no acute events overnight.    Plan  Current plan is for social work placement.   Carrie Mew, MD 07/29/22 [phone]

## 2022-07-29 NOTE — TOC Progression Note (Signed)
Transition of Care [facility]) - Progression Note    Patient Details  Name: Maclaine Ahola MRN: [id number] Date of Birth: [birth year]  [facility]) CM/SW Contact  Shelbie Hutching, RN Phone Number: 07/29/2022, 3:35 PM  Clinical Narrative:    Patient's guardian and her case worker with [facility] held a team meeting today about placement, so far all referrals have been denied.     Expected Discharge Plan:  (TBD- patient needs higher level than group home) Barriers to Discharge: ED Unsafe disposition  Expected Discharge Plan and Services Expected Discharge Plan:  (TBD- patient needs higher level than group home) In-house Referral: Chaplain Discharge Planning Services: CM Consult   Living arrangements for the past 2 months: Group Home                 DME Arranged: N/A DME Agency: NA       HH Arranged: NA HH Agency: NA         Social Determinants of Health (SDOH) Interventions    Readmission Risk Interventions     No data to display

## 2022-07-29 NOTE — ED Notes (Signed)
Vol /pending placement 

## 2022-07-29 NOTE — ED Notes (Signed)
Pt given snack. 

## 2022-07-30 NOTE — TOC Progression Note (Signed)
Transition of Care [facility]) - Progression Note    Patient Details  Name: Crystal [name] MRN: [id number] Date of Birth: [birth year]  Transition of Care [facility]) CM/SW Contact  Shelbie Hutching, RN Phone Number: 07/30/2022, 5:02 PM  Clinical Narrative:    [name] with [facility] has found 2 places with openings that are currently reviewing the patient's referral, she is waiting to hear back from them.    Expected Discharge Plan:  (TBD- patient needs higher level than group home) Barriers to Discharge: ED Unsafe disposition  Expected Discharge Plan and Services Expected Discharge Plan:  (TBD- patient needs higher level than group home) In-house Referral: Chaplain Discharge Planning Services: CM Consult   Living arrangements for the past 2 months: Group Home                 DME Arranged: N/A DME Agency: NA       HH Arranged: NA HH Agency: NA         Social Determinants of Health (SDOH) Interventions    Readmission Risk Interventions     No data to display

## 2022-07-30 NOTE — ED Notes (Signed)
Pt given lunch tray.

## 2022-07-30 NOTE — ED Notes (Signed)
Pt given dinner tray.

## 2022-07-30 NOTE — ED Notes (Signed)
Pt in the bathroom  

## 2022-07-30 NOTE — ED Notes (Signed)
Snack, icecream and drinks provided.

## 2022-07-30 NOTE — Progress Notes (Signed)
   07/30/22 0915  Clinical Encounter Type  Visited With Patient  Visit Type Follow-up   Chaplain followed up to give patient a book which had been requested in previous visit.

## 2022-07-30 NOTE — ED Notes (Signed)
VOl pending TOC placement

## 2022-07-30 NOTE — ED Notes (Signed)
Pt in shower.  

## 2022-07-30 NOTE — ED Notes (Signed)

## 2022-07-31 MED ORDER — IBUPROFEN 800 MG PO TABS
ORAL_TABLET | ORAL | Status: AC
  Filled 2022-07-31: qty 1

## 2022-07-31 MED ORDER — IBUPROFEN 800 MG PO TABS
400.0000 mg | ORAL_TABLET | Freq: Four times a day (QID) | ORAL | Status: DC | PRN
  Administered 2022-07-31 – 2022-09-12 (×11): 400 mg via ORAL
  Filled 2022-07-31 (×10): qty 1

## 2022-07-31 NOTE — ED Notes (Signed)
Patient provided breakfast tray

## 2022-07-31 NOTE — ED Notes (Signed)
VOL/Pending TOC Placement 

## 2022-07-31 NOTE — ED Notes (Signed)
Pt given drink and snack.

## 2022-07-31 NOTE — ED Notes (Signed)
Pt awake asked to use restroom

## 2022-07-31 NOTE — ED Notes (Signed)
Pt given dinner tray and drink

## 2022-07-31 NOTE — ED Provider Notes (Signed)
Emergency Medicine Observation Re-evaluation Note  Crystal [name] is a 31 y.o. female, seen in the emergency department for psychiatric complaint.  No acute events since last update  Physical Exam  BP 113/79 (BP Location: Left Arm)   Pulse 83   Temp 98.1 F (36.7 C) (Oral)   Resp 16   Ht '5\' 1"'$  (1.549 m)   Wt 72.6 kg   SpO2 98%   BMI 30.23 kg/m    ED Course / MDM   No recent lab work for review  Plan  Current plan is for placement to an appropriate living facility once available.    Harvest Dark, MD 07/31/22 [phone]

## 2022-07-31 NOTE — ED Notes (Signed)
Pt given snack. 

## 2022-07-31 NOTE — ED Notes (Signed)
Pt given nighttime snack. 

## 2022-07-31 NOTE — ED Notes (Signed)
VOL  PENDING  TOC  PLACEMENT

## 2022-07-31 NOTE — ED Notes (Signed)
Pt up to restroom.

## 2022-07-31 NOTE — ED Notes (Signed)
Pt alert and calm. Pt to dayroom and right back to room.

## 2022-08-01 MED ORDER — DOCUSATE SODIUM 100 MG PO CAPS
100.0000 mg | ORAL_CAPSULE | Freq: Once | ORAL | Status: AC
  Administered 2022-08-01: 100 mg via ORAL
  Filled 2022-08-01: qty 1

## 2022-08-01 NOTE — Progress Notes (Signed)
Attempted visit per consult. No desire to talk at this time.

## 2022-08-01 NOTE — ED Notes (Signed)
Received report from [facility].

## 2022-08-01 NOTE — ED Notes (Signed)
Pt given breakfast tray

## 2022-08-01 NOTE — ED Notes (Signed)
VOL/pending placement 

## 2022-08-01 NOTE — ED Notes (Signed)
Provided patient a sandwhich tray, a cup of soda & a cup of icecream.100% consumed. Patient ok.

## 2022-08-01 NOTE — ED Provider Notes (Signed)
Emergency Medicine Observation Re-evaluation Note  Crystal [name] is a 31 y.o. female, seen on rounds today.  Pt initially presented to the ED for complaints of Suicidal  Currently, the patient is calm, no acute complaints.  Physical Exam  Blood pressure 102/69, pulse 95, temperature 97.7 F (36.5 C), temperature source Oral, resp. rate 18, height '5\' 1"'$  (1.549 m), weight 72.6 kg, SpO2 98 %. Physical Exam General: NAD Lungs: CTAB Psych: not agitated  ED Course / MDM  EKG:    I have reviewed the labs performed to date as well as medications administered while in observation.  Recent changes in the last 24 hours include no acute events overnight.    Plan  Current plan is for social work placement   Carrie Mew, MD 08/01/22 1424

## 2022-08-02 NOTE — ED Notes (Signed)
Pt given nighttime snack. 

## 2022-08-02 NOTE — ED Provider Notes (Signed)
Emergency Medicine Observation Re-evaluation Note  Crystal [name] is a 31 y.o. female, seen on rounds today.  Pt initially presented to the ED for complaints of Suicidal  Currently, the patient was sitting in bed- no issues per BHU nurse  Physical Exam  Blood pressure 101/70, pulse 69, temperature 97.6 F (36.4 C), temperature source Oral, resp. rate 18, height '5\' 1"'$  (1.549 m), weight 72.6 kg, SpO2 100 %.  Physical Exam General: No apparent distress Pulm: Normal WOB Psych: resting     ED Course / MDM     I have reviewed the labs performed to date as well as medications administered while in observation.  Recent changes in the last 24 hours include none   Plan   Current plan is to continue to wait for placement Patient is not under full IVC at this time.   Vanessa [place], MD 08/02/22 1018

## 2022-08-02 NOTE — ED Notes (Signed)
Patient is vol pending placement 

## 2022-08-02 NOTE — ED Notes (Signed)
Pt denies SI/HI/AVH. Pt states that there was a disruptive pt on the unit last night making a bunch of noise and she did not sleep well. Other than that she has no complaints at this time.

## 2022-08-02 NOTE — ED Notes (Signed)
Pt given breakfast tray and drink at this time. 

## 2022-08-02 NOTE — ED Notes (Signed)
Pt out of shower at this time. All trash and dirty linen disposed. No other needs voiced at this time.

## 2022-08-02 NOTE — ED Notes (Signed)
Pt given shower supplies, currently in shower at this time.

## 2022-08-03 MED ORDER — HYDROXYZINE HCL 25 MG PO TABS
25.0000 mg | ORAL_TABLET | Freq: Once | ORAL | Status: AC
  Administered 2022-08-03: 25 mg via ORAL
  Filled 2022-08-03: qty 1

## 2022-08-03 NOTE — Consult Note (Signed)
Patient had increased anxiety due to "other people leaving and I am still here." She is tearful today and asking for "something for anxiety." Writer talked to patient, providing encouragement and support. Ordered one time hydroxyzine 25 mg for anxiety.   Sherlon Handing, PMHNP

## 2022-08-03 NOTE — Progress Notes (Signed)
   08/03/22 1500  Clinical Encounter Type  Visited With Patient  Visit Type Follow-up  Referral From Nurse  Consult/Referral To [place] responded to call to provide support to patient who is struggling with conditions and confinement to area while waiting for placement in group home. Chaplain reflectively listened, engaged in meaningful conversation and prayer. Patient requested a novel, a book. Chaplain provided Search a word puzzle and will investigate other literature to give.

## 2022-08-03 NOTE — ED Notes (Signed)
Pt given dinner tray and drink

## 2022-08-03 NOTE — ED Notes (Signed)
Nurse spoke with Patient and she is teary eyed, states " Im never going to get out of here," she states that she is depressed, no Si/hi or avh, Nurse let her know that she would contact social worker and chaplin for her.

## 2022-08-03 NOTE — ED Provider Notes (Signed)
Emergency Medicine Observation Re-evaluation Note  Crystal [name] is a 31 y.o. female, seen on rounds today.  Pt initially presented to the ED for complaints of Suicidal Currently, the patient is resting.   Physical Exam  BP 114/75 (BP Location: Left Arm)   Pulse 84   Temp 98.7 F (37.1 C)   Resp 18   Ht '5\' 1"'$  (1.549 m)   Wt 72.6 kg   SpO2 99%   BMI 30.23 kg/m  Physical Exam General: no acute distress Lungs: nml WOB Psych: calm, no agitation   ED Course / MDM  EKG:EKG Interpretation  Date/Time:  Saturday July 18 2022 09:31:52 EDT Ventricular Rate:  63 PR Interval:  168 QRS Duration: 70 QT Interval:  392 QTC Calculation: 401 R Axis:   65 Text Interpretation: Normal sinus rhythm with sinus arrhythmia Normal ECG No previous ECGs available Confirmed by UNCONFIRMED, DOCTOR ([id number]), editor Dwaine Deter ([id number]) on 07/20/2022 11:12:51 AM  I have reviewed the labs performed to date as well as medications administered while in observation.  Recent changes in the last 24 hours include none.  Plan  Current plan is for SW disposition.  Patient is voluntary.     Rada Hay, MD 08/03/22 [phone]

## 2022-08-03 NOTE — ED Notes (Signed)

## 2022-08-03 NOTE — ED Notes (Signed)
Pt back in room at this time.

## 2022-08-03 NOTE — ED Notes (Signed)
Hospital meal provided, pt tolerated w/o complaints.  Waste discarded appropriately.  

## 2022-08-03 NOTE — ED Notes (Signed)
Crystal [name] is to talk with Patient, Patient remained calm and cooperative, she states that talking to him helps her to feel better.

## 2022-08-03 NOTE — ED Notes (Signed)
VOLUNTARY continues to await TOC placement 

## 2022-08-03 NOTE — ED Notes (Signed)
Patient is alert and oriented, Nurse let her know that the caseworker had stopped by and said there were no new updates at this time, she said ' ok" Patient is calm and cooperative, will continue to monitor.

## 2022-08-04 NOTE — ED Notes (Signed)
Patient's legal guardian in to see Patient, supervised visit, patient was calm and cooperative.

## 2022-08-04 NOTE — ED Notes (Signed)
VOL TOC placement 

## 2022-08-04 NOTE — ED Notes (Signed)
Pt given snack. 

## 2022-08-04 NOTE — ED Notes (Signed)
VOL  TOC  PLACEMENT 

## 2022-08-04 NOTE — ED Provider Notes (Signed)
Emergency Medicine Observation Re-evaluation Note  Crystal [name] is a 31 y.o. female, seen on rounds today.  Pt initially presented to the ED for complaints of Suicidal   Physical Exam  BP 108/72 (BP Location: Right Arm)   Pulse 85   Temp 98.1 F (36.7 C) (Oral)   Resp 16   Ht '5\' 1"'$  (1.549 m)   Wt 72.6 kg   SpO2 100%   BMI 30.23 kg/m  Physical Exam General: NAD  ED Course / MDM  EKG:EKG Interpretation  Date/Time:  Saturday July 18 2022 09:31:52 EDT Ventricular Rate:  63 PR Interval:  168 QRS Duration: 70 QT Interval:  392 QTC Calculation: 401 R Axis:   65 Text Interpretation: Normal sinus rhythm with sinus arrhythmia Normal ECG No previous ECGs available Confirmed by UNCONFIRMED, DOCTOR ([id number]), editor Dwaine Deter ([id number]) on 07/20/2022 11:12:51 AM  I have reviewed the labs performed to date as well as medications administered while in observation.  Recent changes in the last 24 hours include none  Plan  Current plan is for psych/soc.    Merlyn Lot, MD 08/04/22 [phone]

## 2022-08-04 NOTE — ED Notes (Signed)
Patient ate 100% of supper and beverage, she is calm and cooperative. No behavioral issues noted.

## 2022-08-04 NOTE — TOC Progression Note (Signed)
Transition of Care [facility]) - Progression Note    Patient Details  Name: Crystal [name] MRN: [id number] Date of Birth: March 25, 1991  Transition [facility] Care [facility] CM/SW Contact  Shelbie Hutching, RN Phone Number: 08/04/2022, 9:53 AM  Clinical Narrative:    Received a call from [place] with [facility], patient was denied for [facility]  Patient's guardian, [name] is coming to visit with patient today.   Guardian and LME continue to work on placement.    Expected Discharge Plan:  (TBD- patient needs higher level than group home) Barriers to Discharge: ED Unsafe disposition  Expected Discharge Plan and Services Expected Discharge Plan:  (TBD- patient needs higher level than group home) In-house Referral: Chaplain Discharge Planning Services: CM Consult   Living arrangements for the past 2 months: Group Home                 DME Arranged: N/A DME Agency: NA       HH Arranged: NA HH Agency: NA         Social Determinants of Health (SDOH) Interventions    Readmission Risk Interventions     No data to display

## 2022-08-04 NOTE — ED Notes (Signed)
Patient's caseworker talked to patient and let her know that she is working on trying to get her back to her old group home. Patient is excited and hoping to be able to go back. Patient without behavioral issues noted.

## 2022-08-04 NOTE — ED Notes (Signed)
Snack and drink given

## 2022-08-05 NOTE — ED Notes (Signed)
Pt given dinner  

## 2022-08-05 NOTE — ED Notes (Signed)
Pt given snack. 

## 2022-08-05 NOTE — ED Provider Notes (Signed)
Emergency Medicine Observation Re-evaluation Note  Crystal [name] is a 31 y.o. female, seen on rounds today.  Pt initially presented to the ED for complaints of Suicidal   Physical Exam  BP 101/61 (BP Location: Right Arm)   Pulse 88   Temp 98.6 F (37 C) (Oral)   Resp 16   Ht '5\' 1"'$  (1.549 m)   Wt 72.6 kg   SpO2 98%   BMI 30.23 kg/m  Physical Exam General: Calm Psych: SI  ED Course / MDM  EKG:EKG Interpretation  Date/Time:  Saturday July 18 2022 09:31:52 EDT Ventricular Rate:  63 PR Interval:  168 QRS Duration: 70 QT Interval:  392 QTC Calculation: 401 R Axis:   65 Text Interpretation: Normal sinus rhythm with sinus arrhythmia Normal ECG No previous ECGs available Confirmed by UNCONFIRMED, DOCTOR ([id number]), editor Dwaine Deter ([id number]) on 07/20/2022 11:12:51 AM  I have reviewed the labs performed to date as well as medications administered while in observation.  Recent changes in the last 24 hours include - no acute events  Plan  Current plan is for awaiting placement.    Nathaniel Man, MD 08/05/22 [phone]

## 2022-08-05 NOTE — ED Notes (Signed)
VOL TOC placement 

## 2022-08-06 NOTE — ED Notes (Addendum)
Hospital meal provided, pt tolerated w/o complaints.  Waste discarded appropriately.  

## 2022-08-06 NOTE — ED Notes (Signed)
Patient requested and was provided a cup of water. Patient ok.

## 2022-08-06 NOTE — ED Provider Notes (Signed)
Emergency Medicine Observation Re-evaluation Note  Physical Exam   BP 100/72   Pulse 78   Temp 98.2 F (36.8 C)   Resp 18   Ht '5\' 1"'$  (1.549 m)   Wt 72.6 kg   SpO2 95%   BMI 30.23 kg/m   Pt is calm and resting, respirations unlabored, and appears comfortable.   ED Course / MDM   No reported events during my shift at the time of this note.   Pt is awaiting dispo from SW   Lucillie Garfinkel MD    Lucillie Garfinkel, MD 08/06/22 [phone]

## 2022-08-06 NOTE — TOC Progression Note (Signed)
Transition of Care [facility]) - Progression Note    Patient Details  Name: Sundus Pete MRN: [id number] Date of Birth: [birth year]  Transition of Care [facility]) CM/SW Contact  Shelbie Hutching, RN Phone Number: 08/06/2022, 2:44 PM  Clinical Narrative:    No updates from legal guardian or [facility] on placement.  Catrice Horton, facility compliance consultant did come by to see patient today to investigate allegations from the group home.  Safety issues are preventing patient from returning to the facility.     Expected Discharge Plan:  (TBD- patient needs higher level than group home) Barriers to Discharge: ED Unsafe disposition  Expected Discharge Plan and Services Expected Discharge Plan:  (TBD- patient needs higher level than group home) In-house Referral: Chaplain Discharge Planning Services: CM Consult   Living arrangements for the past 2 months: Group Home                 DME Arranged: N/A DME Agency: NA       HH Arranged: NA HH Agency: NA         Social Determinants of Health (SDOH) Interventions    Readmission Risk Interventions     No data to display

## 2022-08-06 NOTE — ED Notes (Signed)
Hospital meal provided, pt tolerated w/o complaints.  Waste discarded appropriately.  

## 2022-08-06 NOTE — ED Notes (Signed)
Vol/TOC placement.

## 2022-08-06 NOTE — ED Notes (Addendum)
Patient took shower changed clothes and gave all items back to RN Seth Bake.

## 2022-08-06 NOTE — ED Notes (Signed)
Pt given dinner tray and drink. Eating in room

## 2022-08-06 NOTE — ED Notes (Signed)
Snack and beverage provided  

## 2022-08-07 NOTE — ED Notes (Signed)
Pt up to restroom.

## 2022-08-07 NOTE — ED Notes (Signed)
Alert, NAD, calm, mentions HA 8/10, and allergies.

## 2022-08-07 NOTE — ED Notes (Signed)
Pt requesting update again about plan for day. Reiterated what SW told this RN.

## 2022-08-07 NOTE — ED Notes (Signed)
Snack and drink given

## 2022-08-07 NOTE — ED Provider Notes (Signed)
Emergency Medicine Observation Re-evaluation Note  Crystal [name] is a 31 y.o. female, seen on rounds today.   Physical Exam  BP 104/68 (BP Location: Left Arm)   Pulse 77   Temp 97.9 F (36.6 C) (Oral)   Resp 19   Ht '5\' 1"'$  (1.549 m)   Wt 72.6 kg   SpO2 98%   BMI 30.23 kg/m  Physical Exam General: Patient resting comfortably in bed Lungs: Patient not in respiratory distress Psych: Patient not combative  ED Course / MDM  EKG:  Plan  Current plan is for social work placement.    Nena Polio, MD 08/07/22 0900

## 2022-08-07 NOTE — ED Notes (Signed)
VOL/pending Placement

## 2022-08-07 NOTE — ED Notes (Signed)
Pt taking shower as requested.

## 2022-08-07 NOTE — ED Notes (Signed)
Pt remains in DR. Calm and cooperative.

## 2022-08-07 NOTE — ED Notes (Signed)
Pt sitting up in bed eating lunch calmly.

## 2022-08-07 NOTE — ED Notes (Signed)
3 females ramping each other up. Increased boisterous behavior. Encouraged/ prompting quiet time. All patients back to room.

## 2022-08-07 NOTE — ED Notes (Signed)
Pt back in dayroom finished with shower.

## 2022-08-07 NOTE — ED Notes (Signed)
Hospital meal provided, pt tolerated w/o complaints.  Waste discarded appropriately.  

## 2022-08-07 NOTE — ED Notes (Signed)
Patient given dinner tray.

## 2022-08-07 NOTE — ED Notes (Signed)
VOL TOC placement 

## 2022-08-07 NOTE — ED Notes (Signed)
Pt given lunch tray and drink by Evelena Peat NT.

## 2022-08-07 NOTE — ED Notes (Signed)
Pt requesting snack and drink. Given by NT.

## 2022-08-07 NOTE — ED Notes (Signed)
Pt requesting update; explained SW contacted around 12 today and told this RN no new updates with placement or plan; pt updated.

## 2022-08-07 NOTE — ED Notes (Signed)
Pt calmly sitting on bed in her room.

## 2022-08-08 MED ORDER — GABAPENTIN 100 MG PO CAPS
100.0000 mg | ORAL_CAPSULE | Freq: Three times a day (TID) | ORAL | Status: DC | PRN
  Administered 2022-08-08 – 2022-09-12 (×11): 100 mg via ORAL
  Filled 2022-08-08 (×11): qty 1

## 2022-08-08 NOTE — ED Notes (Signed)
Pt refused snack

## 2022-08-08 NOTE — ED Notes (Signed)
Breakfast tray given to pt 

## 2022-08-08 NOTE — ED Notes (Signed)
Pt given snack and drink.  Quietly eating and coloring in dayroom with another pt.

## 2022-08-08 NOTE — ED Notes (Signed)
Lunch and drink given.

## 2022-08-08 NOTE — ED Notes (Signed)
Pt requested shower supplies. Supplies given and pt showering at this time. Pt's bed linens were also changed at this time.

## 2022-08-08 NOTE — ED Provider Notes (Signed)
    08/07/2022    7:30 PM 08/07/2022    9:37 AM 08/06/2022    8:05 PM  Vitals with BMI  Systolic 734 99 287  Diastolic 80 60 68  Pulse 68 89 77   Patient seen in the BHU this morning.  She is resting comfortably.  She has been cleared by psychiatry.  Social work is assisting with disposition.    Rada Hay, MD 08/08/22 [phone]

## 2022-08-08 NOTE — ED Notes (Signed)
Chaplain on unit to visit with patient at patient request.

## 2022-08-08 NOTE — ED Notes (Signed)
Pt provided with coloring pages and word search pages at this time. Pt currently sitting in day room eating snack with other patient. Pt denies further needs.

## 2022-08-08 NOTE — ED Notes (Signed)
VOL/TOC Placement 

## 2022-08-08 NOTE — ED Notes (Signed)
Snack given to pt.

## 2022-08-08 NOTE — ED Notes (Signed)
Pt tearful, wanting to discharge back to [facility]. Pt c/o anxiety because she doesn't know how much longer she has to stay here. Staff offered support and encouragement, advised her to be patient as the SW finds her a GH. Pt verbalized understanding of this.

## 2022-08-08 NOTE — Progress Notes (Signed)
Chaplain responded to page requesting a visit per patients request. Pt was receptive and welcoming to [place]. Pt shared her frustration and restlessness in waiting for a new home and waiting in the unknown. Chaplain and pt engaged in soothing conversation which included laughs, different coping mechanism and a new outlook on what forgiveness does for her. Pt. requested word searches. Chaplain offered prayer to elicit spiritual connection and peace. Pt. Believes in God and enjoys the bible, singing, dancing and reading. Pt. Appeared much lighter and joyful after meeting with Chaplain. Chaplain has requested daily visits from Pastoral services.

## 2022-08-09 NOTE — ED Notes (Signed)
Patient given breakfast and drink. Patient given shower supplies.

## 2022-08-09 NOTE — ED Notes (Signed)
VOL/pending placemnt

## 2022-08-09 NOTE — ED Provider Notes (Signed)
Emergency Medicine Observation Re-evaluation Note  Crystal [name] is a 31 y.o. female, seen on rounds today.  Pt initially presented to the ED for complaints of Suicidal Currently, the patient is resting comfortably.  Physical Exam  BP 127/75 (BP Location: Left Arm)   Pulse 78   Temp 97.8 F (36.6 C) (Oral)   Resp 16   Ht '5\' 1"'$  (1.549 m)   Wt 72.6 kg   SpO2 99%   BMI 30.23 kg/m  Physical Exam General: No acute distress Cardiac: Well-perfused extremities Lungs: No respiratory distress Psych: Appropriate mood and affect  ED Course / MDM  EKG:EKG Interpretation  Date/Time:  Saturday July 18 2022 09:31:52 EDT Ventricular Rate:  63 PR Interval:  168 QRS Duration: 70 QT Interval:  392 QTC Calculation: 401 R Axis:   65 Text Interpretation: Normal sinus rhythm with sinus arrhythmia Normal ECG No previous ECGs available Confirmed by UNCONFIRMED, DOCTOR ([id number]), editor Dwaine Deter ([id number]) on 07/20/2022 11:12:51 AM  I have reviewed the labs performed to date as well as medications administered while in observation.  Recent changes in the last 24 hours include none.  Plan  Current plan is for psychiatric placement.    Naaman Plummer, MD 08/09/22 [phone]

## 2022-08-09 NOTE — ED Notes (Signed)
Snack and drink given

## 2022-08-09 NOTE — ED Notes (Signed)
Patient is vol pending placement 

## 2022-08-10 NOTE — ED Notes (Signed)
Pt given cup of water 

## 2022-08-10 NOTE — ED Notes (Signed)
Vol /pending placement 

## 2022-08-10 NOTE — ED Notes (Signed)
VOL/Pending Placement 

## 2022-08-10 NOTE — ED Notes (Signed)
Breakfast and beverage provided

## 2022-08-10 NOTE — ED Provider Notes (Signed)
Emergency Medicine Observation Re-evaluation Note  Physical Exam   BP 117/70 (BP Location: Right Arm)   Pulse 75   Temp 97.8 F (36.6 C) (Oral)   Resp 19   Ht '5\' 1"'$  (1.549 m)   Wt 72.6 kg   SpO2 98%   BMI 30.23 kg/m   Pt is calm and resting, respirations unlabored, and appears comfortable.   ED Course / MDM   No reported events during my shift at the time of this note.   Pt is awaiting dispo from SW   Lucillie Garfinkel MD     Lucillie Garfinkel, MD 08/10/22 [phone]

## 2022-08-10 NOTE — ED Notes (Signed)
Pt given lunch tray and drink

## 2022-08-10 NOTE — ED Notes (Signed)
Dinner tray and beverage provided

## 2022-08-11 NOTE — ED Notes (Signed)
Pt given pm snack and drink

## 2022-08-11 NOTE — ED Notes (Signed)
Hospital meal provided, pt tolerated w/o complaints.  Waste discarded appropriately.  

## 2022-08-11 NOTE — ED Notes (Signed)
Pt took shower. Pt was given hygiene items and the following, 1 clean top, 1 clean bottom, with 1 pair of disposable underwear.  Pt changed out into clean clothing.  Staff disposed of all shower supplies.

## 2022-08-11 NOTE — ED Notes (Addendum)
Pt requesting to use phone, informed that per her guardian on 07/24/22 that she did not have phone privileges at this time. Pt then held stare with RN and appears angry. Explained that this decision was from her guardian, and not nursing staff. Pt then tells RN to call social worker and see what status is of her disposition. RN explained that there is a case manager working on her case, and that any updates will be provided as allowed and appropriate to patient. Pt then yells at RN to "do your job" and "you aren't doing anything for me". Pt balls up fists and stands at nursing station. Pt was instructed by RN and security that she must go to her room and calm herself down. Pt goes to her room, stands at threshold of her room. Shortly thereafter, it appeared that pt had calmed down. RN entered room with security present and talked with patient about unacceptable behaviors shown to staff. Pt apologetic for actions and behavior. She verbalized frustration that she is still in the Emergency Room, and she wants to get out here. Explained to patient that this process takes time, but staff are diligently working on her case to get a plan for disposition. RN provided therapeutic communication and active listening.

## 2022-08-11 NOTE — ED Notes (Signed)
Hospital meal provided.  100% consumed, pt tolerated w/o complaints.  Waste discarded appropriately.   

## 2022-08-11 NOTE — ED Notes (Signed)
Pt given snack. 

## 2022-08-11 NOTE — TOC Progression Note (Signed)
Transition of Care [facility]) - Progression Note    Patient Details  Name: Crystal [name] MRN: [id number] Date of Birth: [birth year]  [facility]) CM/SW Contact  Shelbie Hutching, RN Phone Number: 08/11/2022, 3:28 PM  Clinical Narrative:    [place] Plan with [facility] sent out referral to [facility] today, no bed offers at this time.     Expected Discharge Plan:  (TBD- patient needs higher level than group home) Barriers to Discharge: ED Unsafe disposition  Expected Discharge Plan and Services Expected Discharge Plan:  (TBD- patient needs higher level than group home) In-house Referral: Chaplain Discharge Planning Services: CM Consult   Living arrangements for the past 2 months: Group Home                 DME Arranged: N/A DME Agency: NA       HH Arranged: NA HH Agency: NA         Social Determinants of Health (SDOH) Interventions    Readmission Risk Interventions     No data to display

## 2022-08-11 NOTE — ED Provider Notes (Signed)
Emergency Medicine Observation Re-evaluation Note  Elaysha Bevard is a 31 y.o. female, seen on rounds today.  Pt initially presented to the ED for complaints of Suicidal  Currently, the patient is asleep- no issues per BHU nurse   Physical Exam  Blood pressure 108/77, pulse 70, temperature 98.2 F (36.8 C), temperature source Oral, resp. rate 18, height '5\' 1"'$  (1.549 m), weight 72.6 kg, SpO2 99 %.  Physical Exam General: No apparent distress Pulm: Normal WOB Psych: resting     ED Course / MDM     I have reviewed the labs performed to date as well as medications administered while in observation.  Recent changes in the last 24 hours include none   Plan   Current plan is to continue to wait for placement Patient is not under full IVC at this time.   Vanessa [place], MD 08/11/22 [phone]

## 2022-08-11 NOTE — ED Notes (Signed)
VOL  PENDING  PLACEMENT 

## 2022-08-12 NOTE — ED Notes (Signed)
vol/pending placement.. 

## 2022-08-12 NOTE — TOC Progression Note (Signed)
Transition of Care [facility]) - Progression Note    Patient Details  Name: Crystal [name] MRN: [id number] Date of Birth: 1990/12/03  Transition of [facility]) CM/SW Contact  Shelbie Hutching, RN Phone Number: 08/12/2022, 3:23 PM  Clinical Narrative:    Damaris Schooner with Elwanda Brooklyn with [facility], she has beds available- sending her a referral for placement to consider.     Expected Discharge Plan:  (TBD- patient needs higher level than group home) Barriers to Discharge: ED Unsafe disposition  Expected Discharge Plan and Services Expected Discharge Plan:  (TBD- patient needs higher level than group home) In-house Referral: Chaplain Discharge Planning Services: CM Consult   Living arrangements for the past 2 months: Group Home                 DME Arranged: N/A DME Agency: NA       HH Arranged: NA HH Agency: NA         Social Determinants of Health (SDOH) Interventions    Readmission Risk Interventions     No data to display

## 2022-08-12 NOTE — NC FL2 (Signed)
[place] LEVEL OF CARE SCREENING TOOL     IDENTIFICATION  Patient Name: Crystal [name] Birthdate: [birth year] Sex: female Admission Date (Current Location): 07/18/2022  [place] and [place] Number:  Engineering geologist and Address:  [facility], [address], [place], [place] [address]      Provider Number: [id number]  Attending Physician Name and Address:  No att. providers found  Relative Name and Phone Number:  [place]- [phone]    Current Level of Care: Other (Comment) (ED boarder for placement) Recommended Level of Care: [place] Prior Approval Number:    Date Approved/Denied:   PASRR Number:    Discharge Plan: Other (Comment) (Family Care home)    Current Diagnoses: Patient Active Problem List   Diagnosis Date Noted   Moderate episode of recurrent major depressive disorder ([place])    Autistic spectrum disorder 04/01/2022   Suicidal ideation    Self-inflicted laceration of wrist, initial encounter ([place]) 11/08/2020   HIV (human immunodeficiency virus infection) ([place])    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    Orientation RESPIRATION BLADDER Height & Weight     Self, Time, Situation, Place  Normal Continent Weight: 72.6 kg Height:  '5\' 1"'$  (154.9 cm)  BEHAVIORAL SYMPTOMS/MOOD NEUROLOGICAL BOWEL NUTRITION STATUS      Continent Diet (Regular)  AMBULATORY STATUS COMMUNICATION OF NEEDS Skin   Independent Verbally Normal                       Personal Care Assistance Level of Assistance              Functional Limitations Info  Sight, Hearing, Speech Sight Info: Adequate Hearing Info: Adequate Speech Info: Adequate    SPECIAL CARE FACTORS FREQUENCY                       Contractures Contractures Info: Not present    Additional Factors Info  Code Status, Allergies, Psychotropic  Code Status Info: Full Allergies Info: NKA Psychotropic Info: Autistic Spectrum disorder, Mild intelectual disability, adjustment disorder, major depressive disorder recurrent         Current Medications (08/12/2022):  This is the current hospital active medication list Current Facility-Administered Medications  Medication Dose Route Frequency Provider Last Rate Last Admin   acetaminophen (TYLENOL) tablet 650 mg  650 mg Oral Q6H PRN Vanessa Iredell, MD   650 mg at 08/12/22 0926   busPIRone (BUSPAR) tablet 30 mg  30 mg Oral BID Patrecia Pour, NP   30 mg at 08/12/22 0920   dolutegravir (TIVICAY) tablet 50 mg  50 mg Oral Daily Mumma, Larene [place], MD   50 mg at 08/12/22 0920   And   lamiVUDine (EPIVIR) tablet 300 mg  300 mg Oral Daily Mumma, Larene [place], MD   300 mg at 08/12/22 4132   gabapentin (NEURONTIN) capsule 100 mg  100 mg Oral TID PRN Patrecia Pour, NP   100 mg at 08/08/22 1548   ibuprofen (ADVIL) tablet 400 mg  400 mg Oral Q6H PRN Nena Polio, MD   400 mg at 08/08/22 0921   loratadine (CLARITIN) tablet 10 mg  10 mg Oral Daily PRN Delman Kitten, MD   10 mg at 08/07/22 0934   melatonin tablet 5 mg  5 mg Oral QHS Nena Polio, MD   5 mg at 08/11/22 2145  risperiDONE (RISPERDAL) tablet 1 mg  1 mg Oral BID Patrecia Pour, NP   1 mg at 08/12/22 9390   sertraline (ZOLOFT) tablet 200 mg  200 mg Oral Daily Patrecia Pour, NP   200 mg at 08/12/22 3009   Current Outpatient Medications  Medication Sig Dispense Refill   acetaminophen (TYLENOL) 325 MG tablet Take 650 mg by mouth every 6 (six) hours as needed.     ARIPiprazole (ABILIFY) 20 MG tablet Take 1 tablet (20 mg total) by mouth daily. (Patient not taking: Reported on 04/01/2022) 30 tablet 1   atorvastatin (LIPITOR) 20 MG tablet Take 20 mg by mouth daily.     busPIRone (BUSPAR) 30 MG tablet Take 1 tablet (30 mg total) by mouth 2 (two) times daily. 60 tablet 1   Cholecalciferol (VITAMIN D3) 1.25 MG (50000 UT) TABS Take 1 capsule by mouth  once a week.     diphenhydrAMINE (BENADRYL) 2 % cream Apply 1 application topically 2 (two) times daily as needed for itching. (apply to feet) (Patient not taking: Reported on 04/01/2022) 30 g 0   docusate sodium (COLACE) 100 MG capsule Take 1 capsule (100 mg total) by mouth 2 (two) times daily. 60 capsule 1   dolutegravir-lamiVUDine (DOVATO) 50-300 MG tablet Take 1 tablet by mouth daily. 30 tablet 5   hydrOXYzine (VISTARIL) 100 MG capsule Take 1 capsule (100 mg total) by mouth at bedtime. (Patient not taking: Reported on 04/01/2022) 30 capsule 1   linaclotide (LINZESS) 145 MCG CAPS capsule Take 145 mcg by mouth daily before breakfast.     medroxyPROGESTERone (DEPO-PROVERA) 150 MG/ML injection Inject 150 mg into the muscle every 3 (three) months.     melatonin 3 MG TABS tablet Take 2 tablets (6 mg total) by mouth at bedtime. (Patient not taking: Reported on 04/01/2022) 60 tablet 1   prazosin (MINIPRESS) 2 MG capsule Take 2 mg by mouth at bedtime.     risperiDONE ER (PERSERIS) 120 MG PRSY Inject 1 Syringe into the skin every 30 (thirty) days.     sertraline (ZOLOFT) 100 MG tablet Take 1.5 tablets (150 mg total) by mouth daily. (Patient taking differently: Take 200 mg by mouth daily. 2 tabs) 45 tablet 1   Suvorexant (BELSOMRA) 10 MG TABS Take 1 tablet by mouth at bedtime.     vitamin B-12 (CYANOCOBALAMIN) 1000 MCG tablet Take 1,000 mcg by mouth daily.       Discharge Medications: Please see discharge summary for a list of discharge medications.  Relevant Imaging Results:  Relevant Lab Results:   Additional Information    Shelbie Hutching, RN

## 2022-08-12 NOTE — ED Notes (Signed)
Patient is in the dayroom, interacting with other Patients, no signs of distress, did ask nurse when she might be able to leave, Nurse let her know that there were no updates at this time.

## 2022-08-12 NOTE — ED Provider Notes (Signed)
Emergency Medicine Observation Re-evaluation Note  Crystal [name] is a 31 y.o. female, seen on rounds today.  Pt initially presented to the ED for complaints of Suicidal  Laying in bed, fully alert.  Inquiring about if she might be able to find a group home soon.  Physical Exam   Vitals:   08/11/22 0900 08/11/22 1924  BP: 111/86 113/77  Pulse: 78 82  Resp: 18 18  Temp: 98.1 F (36.7 C) 98.2 F (36.8 C)  SpO2: 99% 98%     Physical Exam General: No apparent distress Pulm: Normal WOB Psych: resting     ED Course / MDM     I have reviewed the labs performed to date as well as medications administered while in observation.  Recent changes in the last 24 hours include none   Plan   Current plan is to continue to wait for placement Patient is not under full IVC at this time.     Delman Kitten, MD 08/12/22 [phone]

## 2022-08-12 NOTE — ED Notes (Signed)

## 2022-08-12 NOTE — ED Notes (Signed)
Patient is calm and cooperative, had a snack, she is pleasant and cooperative.

## 2022-08-12 NOTE — ED Notes (Signed)
Pt given snack. 

## 2022-08-12 NOTE — ED Notes (Signed)
Pt given sprite, one juice, and cereal at this time.

## 2022-08-12 NOTE — ED Notes (Signed)
VOL/pending placement 

## 2022-08-12 NOTE — ED Notes (Signed)
Patient ate 100% of supper and beverage, no signs of distress. Patient is safe, no si/hi or avh.

## 2022-08-13 NOTE — ED Notes (Signed)
Dinner provided to pt.

## 2022-08-13 NOTE — ED Notes (Signed)
Pt in the shower at this time 

## 2022-08-13 NOTE — ED Notes (Signed)
Pt received ice cream, graham crackers, and juice/water for snack.

## 2022-08-13 NOTE — Progress Notes (Signed)
   08/13/22 1100  Clinical Encounter Type  Visited With Patient  Visit Type Follow-up  Referral From Nurse  Consult/Referral To Chaplain  Spiritual Encounters  Spiritual Needs Emotional   Chaplain responded to call to provide emotional support to patient who expressed anxiety over length of stay, especially since today is Thanksgiving. Chaplain intervened through listening and conversation.

## 2022-08-13 NOTE — ED Provider Notes (Signed)
Emergency Medicine Observation Re-evaluation Note  Crystal [name] is a 31 y.o. female, seen on rounds today.  Pt initially presented to the ED for complaints of Suicidal  Currently, the patient is resting- no issues per bhu nurse  Physical Exam  Blood pressure 102/74, pulse 91, temperature 98.2 F (36.8 C), temperature source Oral, resp. rate 20, height '5\' 1"'$  (1.549 m), weight 72.6 kg, SpO2 98 %.  Physical Exam General: No apparent distress Pulm: Normal WOB Psych: resting     ED Course / MDM     I have reviewed the labs performed to date as well as medications administered while in observation.  Recent changes in the last 24 hours include none   Plan   Current plan is to continue to wait for SW palcement Patient is not under full IVC at this time.   Vanessa [place], MD 08/13/22 [phone]

## 2022-08-13 NOTE — ED Notes (Signed)
Pt out of shower at this time

## 2022-08-13 NOTE — ED Notes (Signed)
Chaplain in to speak with pt

## 2022-08-14 LAB — BASIC METABOLIC PANEL
Anion gap: 9 (ref 5–15)
BUN: 26 mg/dL — ABNORMAL HIGH (ref 6–20)
CO2: 21 mmol/L — ABNORMAL LOW (ref 22–32)
Calcium: 9.2 mg/dL (ref 8.9–10.3)
Chloride: 108 mmol/L (ref 98–111)
Creatinine, Ser: 1.31 mg/dL — ABNORMAL HIGH (ref 0.44–1.00)
GFR, Estimated: 56 mL/min — ABNORMAL LOW (ref >60)
Glucose, Bld: 104 mg/dL — ABNORMAL HIGH (ref 70–99)
Potassium: 3.8 mmol/L (ref 3.5–5.1)
Sodium: 138 mmol/L (ref 135–145)

## 2022-08-14 LAB — CBC WITH DIFFERENTIAL/PLATELET
Abs Immature Granulocytes: 0.13 10*3/uL — ABNORMAL HIGH (ref 0.00–0.07)
Basophils Absolute: 0.1 10*3/uL (ref 0.0–0.1)
Basophils Relative: 1 %
Eosinophils Absolute: 0.3 10*3/uL (ref 0.0–0.5)
Eosinophils Relative: 3 %
HCT: 37 % (ref 36.0–46.0)
Hemoglobin: 12.4 g/dL (ref 12.0–15.0)
Immature Granulocytes: 1 %
Lymphocytes Relative: 29 %
Lymphs Abs: 3 10*3/uL (ref 0.7–4.0)
MCH: 32.8 pg (ref 26.0–34.0)
MCHC: 33.5 g/dL (ref 30.0–36.0)
MCV: 97.9 fL (ref 80.0–100.0)
Monocytes Absolute: 1.1 10*3/uL — ABNORMAL HIGH (ref 0.1–1.0)
Monocytes Relative: 11 %
Neutro Abs: 5.6 10*3/uL (ref 1.7–7.7)
Neutrophils Relative %: 55 %
Platelets: 235 10*3/uL (ref 150–400)
RBC: 3.78 MIL/uL — ABNORMAL LOW (ref 3.87–5.11)
RDW: 12.7 % (ref 11.5–15.5)
WBC: 10 10*3/uL (ref 4.0–10.5)
nRBC: 0 % (ref 0.0–0.2)

## 2022-08-14 LAB — RESP PANEL BY RT-PCR (FLU A&B, COVID) ARPGX2
Influenza A by PCR: NEGATIVE
Influenza B by PCR: NEGATIVE
SARS Coronavirus 2 by RT PCR: NEGATIVE

## 2022-08-14 LAB — GROUP A STREP BY PCR: Group A Strep by PCR: NOT DETECTED

## 2022-08-14 NOTE — ED Notes (Signed)
Report from crystal, rn.

## 2022-08-14 NOTE — ED Notes (Signed)
Dinner was given. Patient ate a 4pc count of chicken tenders.

## 2022-08-14 NOTE — ED Provider Notes (Addendum)
-----------------------------------------   9:11 PM on 08/14/2022 -----------------------------------------  Patient was noted by the ER to be tachycardic and hypotensive.  The tachycardia seems to have resolved spontaneously.  The patient is afebrile.  I went to evaluate her.  She is sitting in the chair in the day room and is comfortable appearing.  She reports a headache and sore throat.  She has strong radial pulses with a normal heart rate.  Oropharynx is clear.  Her voice is clear.  Her extremities appear well-perfused.  I have ordered a respiratory panel, strep swab, as well as repeat BMP and CBC and she can receive her as needed Tylenol.  We will reassess once labs are resulted.   ----------------------------------------- 10:34 PM on 08/14/2022 -----------------------------------------  Vital signs have normalized and the labs are unremarkable.  There is no indication for further acute intervention.   Arta Silence, MD 08/14/22 2235

## 2022-08-14 NOTE — ED Provider Notes (Signed)
Emergency Medicine Observation Re-evaluation Note  Crystal [name] is a 31 y.o. female, seen on rounds today.  Pt initially presented to the ED for complaints of Suicidal  Currently, the patient is is no acute distress. Denies any concerns at this time.  Physical Exam  Blood pressure 105/73, pulse 100, temperature 97.6 F (36.4 C), temperature source Oral, resp. rate 18, height '5\' 1"'$  (1.549 m), weight 72.6 kg, SpO2 99 %.  Physical Exam: General: No apparent distress Pulm: Normal WOB Neuro: Moving all extremities Psych: Resting comfortably     ED Course / MDM     I have reviewed the labs performed to date as well as medications administered while in observation.  Recent changes in the last 24 hours include: No acute events overnight.  Plan   Current plan: Patient awaiting psychiatric disposition. Patient is not under full IVC at this time.    Nathaniel Man, MD 08/14/22 [phone]

## 2022-08-14 NOTE — ED Notes (Signed)
VOL/pending placement 

## 2022-08-14 NOTE — ED Notes (Signed)
During vital assessment pt found to be tachycardic and hypotensive. MD made aware. Pt has no physical complaints.

## 2022-08-14 NOTE — ED Notes (Signed)
Edp at bedside °

## 2022-08-14 NOTE — TOC Progression Note (Signed)
Transition of Care [facility]) - Progression Note    Patient Details  Name: Crystal [name] MRN: [id number] Date of Birth: January 06, 1991  [facility] CM/SW Contact  Shelbie Hutching, RN Phone Number: 08/14/2022, 11:52 AM  Clinical Narrative:    Called and left a message with Elwanda Brooklyn to see if she received the referral that was secure emailed on Wed.   No updates from [facility] because of the Holiday will follow up with them on Monday.    Expected Discharge Plan:  (TBD- patient needs higher level than group home) Barriers to Discharge: ED Unsafe disposition  Expected Discharge Plan and Services Expected Discharge Plan:  (TBD- patient needs higher level than group home) In-house Referral: Chaplain Discharge Planning Services: CM Consult   Living arrangements for the past 2 months: Group Home                 DME Arranged: N/A DME Agency: NA       HH Arranged: NA HH Agency: NA         Social Determinants of Health (SDOH) Interventions    Readmission Risk Interventions     No data to display

## 2022-08-15 NOTE — ED Notes (Signed)
Pt given lunch tray.

## 2022-08-15 NOTE — ED Notes (Signed)
Pt stated she urinated in her scrub pants on accident. Pt requested new scrub pants and underwear. Pt provided with both.

## 2022-08-15 NOTE — ED Notes (Signed)
Pt given breakfast tray

## 2022-08-15 NOTE — ED Notes (Signed)
Report to ariel, rn.  

## 2022-08-15 NOTE — ED Notes (Signed)
pt recieved snack and drink 

## 2022-08-15 NOTE — ED Notes (Signed)
VOL/pending placement 

## 2022-08-15 NOTE — ED Notes (Signed)
Pt given dinner tray.

## 2022-08-15 NOTE — ED Provider Notes (Signed)
Emergency Medicine Observation Re-evaluation Note  Physical Exam   BP 109/74 (BP Location: Right Arm)   Pulse 100   Temp 97.7 F (36.5 C) (Oral)   Resp 17   Ht '5\' 1"'$  (1.549 m)   Wt 72.6 kg   SpO2 97%   BMI 30.23 kg/m   Pt is calm and resting, respirations unlabored, and appears comfortable.   ED Course / MDM   No reported events during my shift at the time of this note.   Pt is awaiting dispo from SW   Lucillie Garfinkel MD    Lucillie Garfinkel, MD 08/15/22 [phone]

## 2022-08-16 NOTE — ED Notes (Signed)
Afternoon snack provided

## 2022-08-16 NOTE — ED Notes (Signed)
Snacks given 

## 2022-08-16 NOTE — ED Notes (Signed)
AM snack provided

## 2022-08-16 NOTE — Progress Notes (Signed)
  Chaplain On-Call visited with patient in group area after being paged to visit another patient.  Chaplain provided much spiritual and emotional support for the patient as she described many challenges she has experienced in group homes.  The patient spoke frequently about her desire to return to school to obtain her GED, and also to learn to drive in order to obtain a driver's license.  The patient received much support from two other patients in the room.  Chaplain concluded the visit with prayer at patient's request.  Chaplain Pollyann Samples M.Div., [facility]

## 2022-08-16 NOTE — ED Notes (Signed)
VOL/Pending Placement 

## 2022-08-16 NOTE — ED Notes (Signed)
Snack and beverage provided  

## 2022-08-16 NOTE — ED Notes (Signed)
Dinner tray provided

## 2022-08-16 NOTE — ED Notes (Signed)
Lunch tray provided. 

## 2022-08-16 NOTE — ED Notes (Signed)
Breakfast try provided

## 2022-08-16 NOTE — ED Provider Notes (Signed)
Emergency Medicine Observation Re-evaluation Note  Crystal [name] is a 31 y.o. female, seen on rounds today.  Pt initially presented to the ED for complaints of Suicidal   Physical Exam  BP 102/64 (BP Location: Left Arm)   Pulse 76   Temp 98.2 F (36.8 C)   Resp 18   Ht '5\' 1"'$  (1.549 m)   Wt 72.6 kg   SpO2 96%   BMI 30.23 kg/m  Physical Exam General: NAD  ED Course / MDM  EKG:EKG Interpretation  Date/Time:  Saturday July 18 2022 09:31:52 EDT Ventricular Rate:  63 PR Interval:  168 QRS Duration: 70 QT Interval:  392 QTC Calculation: 401 R Axis:   65 Text Interpretation: Normal sinus rhythm with sinus arrhythmia Normal ECG No previous ECGs available Confirmed by UNCONFIRMED, DOCTOR ([id number]), editor Dwaine Deter ([id number]) on 07/20/2022 11:12:51 AM    Plan  Current plan is for SW.    Merlyn Lot, MD 08/16/22 [phone]

## 2022-08-17 NOTE — ED Notes (Signed)
Attempted to call legal guardian per patient's request with no answer. Patient informed she has lost phone privileges, but requested this RN speak to legal guardian for her.

## 2022-08-17 NOTE — ED Notes (Signed)
Patient asked for medication for anxiety after saying she didn't feel safe after another patient verbalizing sexual thoughts about another patient. Medication given as prn ordered. Patient then immediately went and colored cuss words on the wall in her room.

## 2022-08-17 NOTE — ED Notes (Signed)
Pt came to this tech stating "I've done something bad because I am mad." This tech came to pts room and observed profanity and other vile words written all over the walls in her room. Pt stated she did it "because we won't let me go to another hospital." Tech and security explained to pt that if she wanted to write, paper could be provided. Pt made aware that this behavior was not permitted and that writing on the walls is not the way to express her anger. Tech explained to pt that crayons are a privilege and that due to her behavior, crayons would be taken away. Pt stated "okay." EVS called to bring materials to clean crayon off the walls.

## 2022-08-17 NOTE — TOC Progression Note (Signed)
Transition of Care [facility]) - Progression Note    Patient Details  Name: Crystal [name] MRN: [id number] Date of Birth: [birth year]  [facility]) CM/SW Contact  Shelbie Hutching, RN Phone Number: 08/17/2022, 3:56 PM  Clinical Narrative:    Received a call from [facility] with [facility] this morning, she has not heard back from any referrals that she has sent out, I have not heard back from [place]. [facility] continues to look for placement.    Expected Discharge Plan:  (TBD- patient needs higher level than group home) Barriers to Discharge: ED Unsafe disposition  Expected Discharge Plan and Services Expected Discharge Plan:  (TBD- patient needs higher level than group home) In-house Referral: Chaplain Discharge Planning Services: CM Consult   Living arrangements for the past 2 months: Group Home                 DME Arranged: N/A DME Agency: NA       HH Arranged: NA HH Agency: NA         Social Determinants of Health (SDOH) Interventions    Readmission Risk Interventions     No data to display

## 2022-08-17 NOTE — ED Notes (Addendum)
Patient's room searched for further writing utensils, linens stripped and replaced with new linens. All additional items (books, papers, etc) removed from room.

## 2022-08-17 NOTE — ED Notes (Signed)
This tech, RN, and Security scrubbed the crayon off the walls in the pts room.

## 2022-08-17 NOTE — ED Notes (Signed)
Pt given ice water.

## 2022-08-17 NOTE — ED Notes (Signed)
vol/pending toc placement.. 

## 2022-08-17 NOTE — ED Notes (Signed)
Pt provided lunch meal tray with double portions and cup of diet cola in a foam cup.

## 2022-08-17 NOTE — ED Notes (Signed)
Patient threw water on another patient. Patient had no explanation of why she did it other than she wants to go to another hospital.

## 2022-08-17 NOTE — ED Notes (Signed)
Patient was given a breakfast meal tray. Patient was offered a shower and it was provided.

## 2022-08-17 NOTE — ED Notes (Signed)
Pt given dinner tray and drink at this time. Pt apologized to other patient for throwing water at her. Pt given new clean scrubs and socks d/t urinating on self. When questioned on why she urinated on self pt stated, "I didn't want to go to the restroom because I was still mad". Towels were given to pt to clean urine up. No other needs voiced at this time.

## 2022-08-18 NOTE — ED Notes (Signed)
Pt given dinner meal

## 2022-08-18 NOTE — ED Notes (Signed)
VOL/pending placement 

## 2022-08-18 NOTE — ED Notes (Signed)
Pt taking shower. Pt was given hygiene items and the following, 1 clean top, 1 clean bottom, with 1 pair of disposable underwear.  Pt changed out into clean clothing.  Staff disposed of all shower supplies.   

## 2022-08-18 NOTE — ED Notes (Signed)
Writer gave patient her bible and other religous book to patient.

## 2022-08-18 NOTE — Progress Notes (Signed)
   08/18/22 1500  Clinical Encounter Type  Visited With Patient  Visit Type Follow-up;Social support  Spiritual Encounters  Spiritual Needs Emotional   Patient requested visit of Chaplain to talk through Bible verses and for patient to talk about her outbursts and anger and some of the challenges of interacting with other patients. Chaplain provided a listen and reflective poster allowing patient to express her feelings and thoughts. Patient requested sheets of Marina Goodell and Christmas music.

## 2022-08-18 NOTE — ED Notes (Signed)
Report off to kim rn  

## 2022-08-18 NOTE — ED Notes (Signed)
Pt given snack. 

## 2022-08-18 NOTE — ED Notes (Signed)
Patient asking this Probation officer if she can have her books back.  Writer advised patient it would be determined today with her behaviors.  Patient re-educated of proper ways of handling herself when she is upset and not writing on walls and acting out in other ways as she had done the day before. Patient then asked if she could have her person bible in her belongings.  Writer advised no she could not have that.

## 2022-08-19 NOTE — ED Notes (Signed)
Hospital meal provided, pt tolerated w/o complaints.  Waste discarded appropriately.  

## 2022-08-19 NOTE — ED Notes (Signed)
Patient talking with the chaplin, no signs of distress, no behavioral issues, will continue to  monitor.

## 2022-08-19 NOTE — ED Notes (Signed)
Pt given snack. 

## 2022-08-19 NOTE — ED Provider Notes (Signed)
Emergency Medicine Observation Re-evaluation Note  Crystal [name] is a 31 y.o. female, seen on rounds today.  Pt initially presented to the ED for complaints of Suicidal  Currently, the patient is asleep- no issues per bhu nurse  Physical Exam  Blood pressure 101/71, pulse 85, temperature 98.1 F (36.7 C), temperature source Oral, resp. rate 14, height '5\' 1"'$  (1.549 m), weight 72.6 kg, SpO2 98 %.  Physical Exam General: No apparent distress Pulm: Normal WOB Psych: resting     ED Course / MDM     I have reviewed the labs performed to date as well as medications administered while in observation.  Recent changes in the last 24 hours include none   Plan   Current plan is to continue to wait for sw placement Patient is not under full IVC at this time.   Vanessa [employer], MD 08/19/22 [phone]

## 2022-08-19 NOTE — ED Notes (Signed)
Patient was given meal for dinner.

## 2022-08-19 NOTE — ED Notes (Signed)
Snack was provided with soda. No other needs at this time

## 2022-08-19 NOTE — ED Notes (Signed)
VOL / pending TOC placement 

## 2022-08-20 NOTE — ED Provider Notes (Signed)
Emergency Medicine Observation Re-evaluation Note  Crystal [name] is a 31 y.o. female, seen on rounds today.  Pt initially presented to the ED for complaints of Suicidal Currently, the patient is calm, resting.  Physical Exam  BP 115/79   Pulse 75   Temp 97.7 F (36.5 C) (Oral)   Resp 16   Ht '5\' 1"'$  (1.549 m)   Wt 72.6 kg   SpO2 98%   BMI 30.23 kg/m    ED Course / MDM  EKG:EKG Interpretation  Date/Time:  Saturday July 18 2022 09:31:52 EDT Ventricular Rate:  63 PR Interval:  168 QRS Duration: 70 QT Interval:  392 QTC Calculation: 401 R Axis:   65 Text Interpretation: Normal sinus rhythm with sinus arrhythmia Normal ECG No previous ECGs available Confirmed by UNCONFIRMED, DOCTOR ([id number]), editor Dwaine Deter ([id number]) on 07/20/2022 11:12:51 AM  I have reviewed the labs performed to date as well as medications administered while in observation.  Recent changes in the last 24 hours include none.  Plan  Current plan is for SW disposition.    Duffy Bruce, MD 08/20/22 2140

## 2022-08-20 NOTE — ED Notes (Signed)
vol/pending toc placement.. 

## 2022-08-20 NOTE — ED Notes (Signed)
Pt given snack. 

## 2022-08-20 NOTE — ED Notes (Signed)
Pt received dinner tray and drink.

## 2022-08-20 NOTE — ED Notes (Signed)
Called dietary to make sure they understand to send pt regular portion meals as per policy per Raquel and pt to receive 3 snacks per day like all other BHU pt's per policy; pt previously received both double portion meals and all snacks.

## 2022-08-21 NOTE — TOC Progression Note (Signed)
Transition of Care [facility]) - Progression Note    Patient Details  Name: Crystal [name] MRN: [id number] Date of Birth: [birth year]  Transition of Care [facility]) CM/SW Contact  Shelbie Hutching, RN Phone Number: 08/21/2022, 3:01 PM  Clinical Narrative:    Met with patient yesterday, she hopes to find a place to go before Christmas.  Called and left a message for patient's guardian for return call, patient is hoping that Mrs. Truddie Coco can come and visit her.  Left a message with Stacy with [facility] for update on placement.    Expected Discharge Plan:  (TBD- patient needs higher level than group home) Barriers to Discharge: ED Unsafe disposition  Expected Discharge Plan and Services Expected Discharge Plan:  (TBD- patient needs higher level than group home) In-house Referral: Chaplain Discharge Planning Services: CM Consult   Living arrangements for the past 2 months: Group Home                 DME Arranged: N/A DME Agency: NA       HH Arranged: NA HH Agency: NA         Social Determinants of Health (SDOH) Interventions    Readmission Risk Interventions     No data to display

## 2022-08-21 NOTE — ED Notes (Signed)
Breakfast tray and drink given at this time

## 2022-08-21 NOTE — ED Notes (Signed)
Patient was given a snack at 2:50pm.

## 2022-08-21 NOTE — ED Notes (Signed)
Pt appears to be sleeping, observed even RR and unlabored, blanket on pt for warmth and comfort, lights off to room to help induce sleep, NAD noted, pt continued monitor by CCTV and security, room secured, no further concerns as of present

## 2022-08-21 NOTE — ED Notes (Signed)
Pt continues to rest observed even RR and unlabored, NAD noted, pt continued monitor by CCTV and security, no further concerns as of present
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
## 2022-08-21 NOTE — ED Provider Notes (Signed)
Emergency Medicine Observation Re-evaluation Note  Physical Exam   BP 115/79   Pulse 75   Temp 97.7 F (36.5 C) (Oral)   Resp 16   Ht '5\' 1"'$  (1.549 m)   Wt 72.6 kg   SpO2 98%   BMI 30.23 kg/m   Pt is calm and resting, respirations unlabored, and appears comfortable.   ED Course / MDM   No reported events during my shift at the time of this note.   Pt is awaiting dispo from SW   Lucillie Garfinkel MD    Lucillie Garfinkel, MD 08/21/22 [phone]

## 2022-08-21 NOTE — ED Notes (Signed)
Hospital meal provided, pt tolerated w/o complaints.  Waste discarded appropriately.  

## 2022-08-21 NOTE — ED Notes (Signed)
Speaking with Chaplin in pt room regarding her anxiety

## 2022-08-21 NOTE — ED Notes (Signed)
Patient was provided lunch.
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
## 2022-08-21 NOTE — ED Notes (Signed)
Pt states she is feeling a little better now.

## 2022-08-21 NOTE — ED Notes (Signed)
Pt given snack and drink at this time.

## 2022-08-21 NOTE — ED Notes (Signed)
Pt has showered at this time

## 2022-08-21 NOTE — ED Notes (Signed)
VOl pending TOC placement note on clipboard

## 2022-08-21 NOTE — ED Notes (Signed)
VOL/Pending TOC Placement 

## 2022-08-22 NOTE — ED Provider Notes (Signed)
Emergency Medicine Observation Re-evaluation Note  Crystal [name] is a 31 y.o. female, seen on rounds today.  Pt initially presented to the ED for complaints of Suicidal   Physical Exam  BP 110/88 (BP Location: Right Arm)   Pulse 74   Temp 97.7 F (36.5 C) (Oral)   Resp 17   Ht '5\' 1"'$  (1.549 m)   Wt 72.6 kg   SpO2 98%   BMI 30.23 kg/m  Physical Exam General: NAD  ED Course / MDM  EKG:EKG Interpretation  Date/Time:  Saturday July 18 2022 09:31:52 EDT Ventricular Rate:  63 PR Interval:  168 QRS Duration: 70 QT Interval:  392 QTC Calculation: 401 R Axis:   65 Text Interpretation: Normal sinus rhythm with sinus arrhythmia Normal ECG No previous ECGs available Confirmed by UNCONFIRMED, DOCTOR ([id number]), editor Dwaine Deter ([id number]) on 07/20/2022 11:12:51 AM  I have reviewed the labs performed to date as well as medications administered while in observation.   Plan  Current plan is for SW.    Merlyn Lot, MD 08/22/22 [phone]

## 2022-08-22 NOTE — ED Notes (Addendum)
Snack given. Pt taking shower per request

## 2022-08-22 NOTE — ED Notes (Signed)
Pt received breakfast tray 

## 2022-08-22 NOTE — ED Notes (Signed)
pt recieved snack and drink 

## 2022-08-22 NOTE — ED Notes (Signed)
Pt states she feels shaky. Blood sugar checked per pt request- BGL 118. Pt given additional crackers per request.

## 2022-08-22 NOTE — Progress Notes (Signed)
Ch conducted routine visit on pt. Pt. Shared she was in distress because she was thanking about her father who passed away and how she would spend Thanksgiving with him. Ch and pt discussed coping strategies to keep calm and self sooth when she is upset and hear voices who tells her to do bad things. She shared she really wants a therapist to talk to and would enjoy visits form [place] because she feels so "empty." Chaplain listened empathetically and normalized her feeling and anxiety.Patient requested a hymn book and bible with old and new testament. Patient also shared her favorite scripture Psalms 36 and how it brings her comfort. Chaplain offered pray to promote peace and spiritual comfort. Pt appeared more calm after visit. Pastoral Services has been notified to continue visits.

## 2022-08-22 NOTE — ED Notes (Signed)
VOL/pending placement 

## 2022-08-22 NOTE — ED Notes (Signed)
Requested chaplain per pt request. Secretary to page.

## 2022-08-23 NOTE — ED Notes (Signed)
Patient is vol pending placement 

## 2022-08-23 NOTE — ED Notes (Signed)
Snack and drink given

## 2022-08-23 NOTE — ED Provider Notes (Signed)
Emergency Medicine Observation Re-evaluation Note  Crystal [name] is a 31 y.o. female, seen on rounds today.  Pt initially presented to the ED for complaints of Suicidal   Physical Exam  BP 97/65 (BP Location: Right Arm)   Pulse 100   Temp 98.1 F (36.7 C) (Oral)   Resp 16   Ht '5\' 1"'$  (1.549 m)   Wt 72.6 kg   SpO2 96%   BMI 30.23 kg/m  Physical Exam General: nad  ED Course / MDM  EKG:EKG Interpretation  Date/Time:  Saturday July 18 2022 09:31:52 EDT Ventricular Rate:  63 PR Interval:  168 QRS Duration: 70 QT Interval:  392 QTC Calculation: 401 R Axis:   65 Text Interpretation: Normal sinus rhythm with sinus arrhythmia Normal ECG No previous ECGs available Confirmed by UNCONFIRMED, DOCTOR ([id number]), editor Dwaine Deter ([id number]) on 07/20/2022 11:12:51 AM   Plan  Current plan is for SW/psych.    Merlyn Lot, MD 08/23/22 [phone]

## 2022-08-23 NOTE — ED Notes (Signed)
Pt provided coloring materials that were brought earlier today by chaplain.

## 2022-08-23 NOTE — ED Notes (Addendum)
Pt given lunch and drink.

## 2022-08-23 NOTE — ED Notes (Signed)
Pt provided snack.

## 2022-08-23 NOTE — ED Notes (Signed)
Pt given breakfast and milk.

## 2022-08-23 NOTE — ED Notes (Signed)
Pt given dinner tray and drink. In room eating

## 2022-08-23 NOTE — ED Notes (Signed)
Pt given snack and drink 

## 2022-08-23 NOTE — ED Notes (Signed)
Pt told this RN that she feels uncomfortable because other pt was telling her he wanted to be together. Helped facilitate conversation between the 2 patients about just being friends. Other pt readily agreed to just be friends and to stop talking about this.

## 2022-08-24 NOTE — ED Provider Notes (Signed)
Emergency Medicine Observation Re-evaluation Note  Physical Exam   BP (!) 95/57 (BP Location: Left Arm)   Pulse 89   Temp 98.1 F (36.7 C) (Oral)   Resp 17   Ht '5\' 1"'$  (1.549 m)   Wt 72.6 kg   SpO2 97%   BMI 30.23 kg/m   Pt is calm and resting, respirations unlabored, and appears comfortable.   ED Course / MDM   No reported events during my shift at the time of this note.   Pt is awaiting dispo from SW   Lucillie Garfinkel MD    Lucillie Garfinkel, MD 08/24/22 [phone]

## 2022-08-24 NOTE — ED Notes (Signed)
Vol placement

## 2022-08-24 NOTE — ED Notes (Signed)
Pt given nighttime snack. 

## 2022-08-25 NOTE — ED Notes (Signed)
Pt given snack. 

## 2022-08-25 NOTE — ED Provider Notes (Signed)
Emergency Medicine Observation Re-evaluation Note  Crystal [name] is a 31 y.o. female, seen on rounds today.  Pt initially presented to the ED for complaints of Suicidal  Currently, the patient is sitting up in bed- no issues per bhu nurse   Physical Exam  Blood pressure 111/79, pulse 78, temperature 97.8 F (36.6 C), temperature source Oral, resp. rate 18, height '5\' 1"'$  (1.549 m), weight 72.6 kg, SpO2 98 %.  Physical Exam General: No apparent distress Pulm: Normal WOB Psych: not agitated      ED Course / MDM     I have reviewed the labs performed to date as well as medications administered while in observation.  Recent changes in the last 24 hours include none   Plan   Current plan is to continue to wait for sw placement  Patient is not under full IVC at this time.   Vanessa [place], MD 08/25/22 [phone]

## 2022-08-25 NOTE — ED Notes (Signed)
Dinner tray and beverage provided

## 2022-08-25 NOTE — ED Notes (Signed)
Report received by Romie Minus. Pt standing in dayroom with no distress noted at this time.

## 2022-08-25 NOTE — TOC Progression Note (Signed)
Transition of Care [facility]) - Progression Note    Patient Details  Name: Crystal [name] MRN: [id number] Date of Birth: 1991/05/26  Transition of Care [facility]) CM/SW Contact  Shelbie Hutching, RN Phone Number: 08/25/2022, 1:08 PM  Clinical Narrative:    Reached out to Stacy with [facility], she is going to do a referral to [employer], she asked the patient's guardian for assistance with the application.  She reports speaking with a group home owner for over an hour yesterday but the group home did not want to take a chance on the patient with her history of elopement.     Expected Discharge Plan:  (TBD- patient needs higher level than group home) Barriers to Discharge: ED Unsafe disposition  Expected Discharge Plan and Services Expected Discharge Plan:  (TBD- patient needs higher level than group home) In-house Referral: Chaplain Discharge Planning Services: CM Consult   Living arrangements for the past 2 months: Group Home                 DME Arranged: N/A DME Agency: NA       HH Arranged: NA HH Agency: NA         Social Determinants of Health (SDOH) Interventions    Readmission Risk Interventions     No data to display

## 2022-08-26 NOTE — Progress Notes (Signed)
  Chaplain On-Call received a call from ED Unit Secretary Lattie Haw at [phone] hours. Lattie Haw reported the request by the patient to see a Chaplain.  After making patient visits in several other Units, Chaplain called the ED BHU at 1045 and learned that the patient is sleeping.  Chaplain will refer this request to the Afternoon Chaplain for follow-up support.  Chaplain Pollyann Samples M.Div., [facility]

## 2022-08-26 NOTE — ED Provider Notes (Signed)
Emergency Medicine Observation Re-evaluation Note  Physical Exam   BP 99/67 (BP Location: Left Arm)   Pulse 88   Temp 98.6 F (37 C) (Oral)   Resp 17   Ht '5\' 1"'$  (1.549 m)   Wt 72.6 kg   SpO2 99%   BMI 30.23 kg/m   Pt is calm and resting, respirations unlabored, and appears comfortable.   ED Course / MDM   No reported events during my shift at the time of this note.   Pt is awaiting dispo from SW   Lucillie Garfinkel MD    Lucillie Garfinkel, MD 08/26/22 1122

## 2022-08-26 NOTE — ED Notes (Signed)
VOL  PENDING  TOC  PLACEMENT

## 2022-08-26 NOTE — ED Notes (Signed)
Pt given snack. 

## 2022-08-26 NOTE — ED Notes (Signed)
Dinner given to pt

## 2022-08-26 NOTE — ED Notes (Signed)
Patient is sitting on the floor putting puzzle together, no signs of distress, will continue to monitor.

## 2022-08-26 NOTE — ED Notes (Signed)
vol/pending toc placment.

## 2022-08-27 NOTE — ED Notes (Signed)
Pt given snack. 

## 2022-08-27 NOTE — ED Notes (Signed)
Pt given dinner tray.

## 2022-08-27 NOTE — ED Notes (Signed)
Patient compliant with medications Prn Tylenol given for abd. pain and Neurontin for anxiety and bedtime and effective. Patient interacting well with Peers and Staff. Support and encouragement provided.

## 2022-08-27 NOTE — ED Notes (Signed)
VOl pending TOC placement

## 2022-08-27 NOTE — ED Notes (Signed)
Report received from RN including SBAR. Patient alert and oriented, warm and dry, in no acute distress. Patient denies SI, HI, AVH. Patient made aware of Q15 minute rounds and security cameras for their safety. Patient instructed to come to this nurse with needs or concerns.

## 2022-08-27 NOTE — ED Provider Notes (Signed)
Emergency Medicine Observation Re-evaluation Note  Crystal [name] is a 31 y.o. female, Crystal [name] seen for psychiatric complaint.  No acute events since last update  Physical Exam  BP 97/64 (BP Location: Left Arm)   Pulse 78   Temp 98 F (36.7 C) (Oral)   Resp 18   Ht '5\' 1"'$  (1.549 m)   Wt 72.6 kg   SpO2 98%   BMI 30.23 kg/m    ED Course / MDM   No recent lab work for review.  Plan  Current plan is for placement in a proper living facility once available.    Harvest Dark, MD 08/27/22 1141

## 2022-08-27 NOTE — TOC Progression Note (Signed)
Transition of Care [facility]) - Progression Note    Patient Details  Name: Crystal [name] MRN: [id number] Date of Birth: March 28, 1991  Transition of Care [facility]) CM/SW Contact  Shelbie Hutching, RN Phone Number: 08/27/2022, 5:23 PM  Clinical Narrative:    Tawni Carnes to 2 facilities that have bed openings per [facility], [facility]- fax [phone] and [facility]- fax [phone].  Also emailed London Pepper [phone], Inpatient Case Manager with [facility] a copy of the [place].     Expected Discharge Plan:  (TBD- patient needs higher level than group home) Barriers to Discharge: ED Unsafe disposition  Expected Discharge Plan and Services Expected Discharge Plan:  (TBD- patient needs higher level than group home) In-house Referral: Chaplain Discharge Planning Services: CM Consult   Living arrangements for the past 2 months: Group Home                 DME Arranged: N/A DME Agency: NA       HH Arranged: NA HH Agency: NA         Social Determinants of Health (SDOH) Interventions    Readmission Risk Interventions     No data to display

## 2022-08-28 NOTE — ED Notes (Signed)
Report given to [facility]

## 2022-08-28 NOTE — ED Notes (Signed)
Hospital meal provided, pt tolerated w/o complaints.  Waste discarded appropriately.  

## 2022-08-28 NOTE — ED Notes (Signed)
Pt given snack and drink at this time.

## 2022-08-28 NOTE — ED Notes (Signed)
Patient dinner tray was given. Patient handed staff a note with a group home number on it. "[facility]". RN notified and sent a message to Education officer, museum. Patient has been cooperative and in the day room must of the shift.

## 2022-08-28 NOTE — ED Notes (Signed)
VOL / pending TOC placement 

## 2022-08-28 NOTE — ED Notes (Signed)
Pt taking shower. Pt was given hygiene items and the following, 1 clean top, 1 clean bottom, with 1 pair of disposable underwear.  Pt changed out into clean clothing.  Staff disposed of all shower supplies.   

## 2022-08-28 NOTE — ED Provider Notes (Signed)
Emergency Medicine Observation Re-evaluation Note  Crystal [name] is a 31 y.o. female, seen on rounds today.  Pt initially presented to the ED for complaints of Suicidal Currently, the patient is resting comfortably.  Physical Exam  BP 110/84 (BP Location: Left Arm)   Pulse 81   Temp 98.2 F (36.8 C) (Oral)   Resp 18   Ht '5\' 1"'$  (1.549 m)   Wt 72.6 kg   SpO2 99%   BMI 30.23 kg/m  Physical Exam General: No acute distress Cardiac: Well-perfused extremities Lungs: No respiratory distress Psych: Appropriate mood and affect  ED Course / MDM  EKG:EKG Interpretation  Date/Time:  Saturday July 18 2022 09:31:52 EDT Ventricular Rate:  63 PR Interval:  168 QRS Duration: 70 QT Interval:  392 QTC Calculation: 401 R Axis:   65 Text Interpretation: Normal sinus rhythm with sinus arrhythmia Normal ECG No previous ECGs available Confirmed by UNCONFIRMED, DOCTOR ([id number]), editor Dwaine Deter ([id number]) on 07/20/2022 11:12:51 AM  I have reviewed the labs performed to date as well as medications administered while in observation.  Recent changes in the last 24 hours include none.  Plan  Current plan is for placement.    Naaman Plummer, MD 08/28/22 [phone]

## 2022-08-28 NOTE — ED Notes (Signed)
Report received from RN including SBAR. Patient alert and oriented, warm and dry, and in no acute distress. Patient denies SI, HI, AVH and pain. Patient made aware of Q15 minute rounds and Engineer, drilling presence for their safety. Patient instructed to come to this nurse with needs or concerns.

## 2022-08-28 NOTE — ED Notes (Signed)
Pt dropped off all used items from shower to [facility].

## 2022-08-28 NOTE — ED Notes (Signed)
Patient would like her Social Worker to know that the [facility] has an opening and this is the number [phone]. Patient received this information from another patient.

## 2022-08-28 NOTE — ED Notes (Signed)
Snack was provided with drink. No other needs at this time
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
## 2022-08-29 NOTE — ED Notes (Signed)
Dinner tray provided

## 2022-08-29 NOTE — ED Notes (Signed)
Patient given lunch tray.

## 2022-08-29 NOTE — ED Notes (Signed)
Pt to bathroom

## 2022-08-29 NOTE — ED Notes (Signed)
Report received from RN including SBAR. Patient alert and oriented, warm and dry, and in no acute distress. Patient denies SI, HI, AVH and pain. Patient made aware of Q15 minute rounds and Engineer, drilling presence for their safety. Patient instructed to come to this nurse with needs or concerns.

## 2022-08-29 NOTE — ED Provider Notes (Signed)
Emergency Medicine Observation Re-evaluation Note  Crystal [name] is a 30 y.o. female, seen on rounds today.  Pt initially presented to the ED for complaints of Suicidal  Currently, the patient is calm, no acute complaints.  Physical Exam  Blood pressure 113/84, pulse 97, temperature 98.1 F (36.7 C), temperature source Oral, resp. rate 18, height '5\' 1"'$  (1.549 m), weight 72.6 kg, SpO2 96 %. Physical Exam General: NAD Lungs: CTAB Psych: not agitated  ED Course / MDM  EKG:    I have reviewed the labs performed to date as well as medications administered while in observation.  Recent changes in the last 24 hours include no acute events overnight.    Plan  Current plan is for [facility] placement   Carrie Mew, MD 08/29/22 1240

## 2022-08-29 NOTE — ED Notes (Signed)
Pt given nighttime snack. 

## 2022-08-29 NOTE — ED Notes (Signed)
Pt given breakfast tray and drink at this time. 

## 2022-08-30 NOTE — ED Notes (Signed)
Pt given breakfast.

## 2022-08-30 NOTE — ED Notes (Signed)
Patient is vol pending placement 

## 2022-08-30 NOTE — ED Notes (Signed)
Pt given snack. 

## 2022-08-30 NOTE — ED Notes (Signed)
Pt given dinner tray.

## 2022-08-30 NOTE — ED Notes (Signed)
Pt given nighttime snack. 

## 2022-08-30 NOTE — ED Provider Notes (Signed)
Emergency Medicine Observation Re-evaluation Note  Eran Windish is a 31 y.o. female, seen on rounds today.    Physical Exam  BP 102/70 (BP Location: Left Arm)   Pulse 77   Temp 98 F (36.7 C) (Oral)   Resp 18   Ht '5\' 1"'$  (1.549 m)   Wt 72.6 kg   SpO2 98%   BMI 30.23 kg/m  Physical Exam General: Patient resting comfortably in bed Lungs: Patient not in respiratory distress Psych: Patient not combative  ED Course / MDM  EKG:  Plan  Current plan is for social work placement.    Nena Polio, MD 08/30/22 [phone]

## 2022-08-31 NOTE — ED Notes (Signed)
vol/pending toc placement.. 

## 2022-08-31 NOTE — ED Notes (Signed)
Pt sitting in dayroom with no signs of acute distress.

## 2022-08-31 NOTE — ED Notes (Signed)
Pt given meal tray and drink.

## 2022-08-31 NOTE — Progress Notes (Signed)
   08/31/22 1600  Clinical Encounter Type  Visited With Patient  Visit Type Follow-up  Referral From Nurse  Consult/Referral To Chaplain   Chaplain responded to request for support. Chaplain provided compassionate presence and reflective listening as patient spoke of the stress of being confined with people she does not want to connect with. Chaplain shared ways of coping with the stressors of being confined with other people. Chaplain services are available for follow up as needed.

## 2022-08-31 NOTE — ED Notes (Signed)
Chaplin in [place] rounding on patients

## 2022-08-31 NOTE — ED Notes (Signed)
Pt given snack. 

## 2022-08-31 NOTE — ED Notes (Signed)
Lights turned out in dayroom and pt encouraged to go to room to sleep. No distress noted.

## 2022-08-31 NOTE — ED Provider Notes (Signed)
Emergency Medicine Observation Re-evaluation Note  Crystal [name] is a 31 y.o. female, seen on rounds today.  Pt initially presented to the ED for complaints of Suicidal Currently, the patient is sleeping in bed, denies any complaints when woken.  Physical Exam  BP 106/74 (BP Location: Left Arm)   Pulse 76   Temp 98.3 F (36.8 C) (Oral)   Resp 18   Ht '5\' 1"'$  (1.549 m)   Wt 72.6 kg   SpO2 98%   BMI 30.23 kg/m  Physical Exam Constitutional: Resting comfortably. Eyes: Conjunctivae are normal. Head: Atraumatic. Nose: No congestion/rhinnorhea. Mouth/Throat: Mucous membranes are moist. Neck: Normal ROM Cardiovascular: No cyanosis noted. Respiratory: Normal respiratory effort. Gastrointestinal: Non-distended. Genitourinary: deferred Musculoskeletal: No lower extremity tenderness nor edema. Neurologic:  Normal speech and language. No gross focal neurologic deficits are appreciated. Skin:  Skin is warm, dry and intact. No rash noted.   ED Course / MDM  EKG:EKG Interpretation  Date/Time:  Saturday July 18 2022 09:31:52 EDT Ventricular Rate:  63 PR Interval:  168 QRS Duration: 70 QT Interval:  392 QTC Calculation: 401 R Axis:   65 Text Interpretation: Normal sinus rhythm with sinus arrhythmia Normal ECG No previous ECGs available Confirmed by UNCONFIRMED, DOCTOR ([id number]), editor Dwaine Deter ([id number]) on 07/20/2022 11:12:51 AM  I have reviewed the labs performed to date as well as medications administered while in observation.  Recent changes in the last 24 hours include none.  Plan  Current plan is for placement per social work.    Blake Divine, MD 08/31/22 1055

## 2022-08-31 NOTE — ED Notes (Signed)
Snack and drink given

## 2022-09-01 NOTE — ED Notes (Signed)
Patient ate breakfast, no signs of distress , denies Si/hi or avh, will continue to monitor, camera surveillance in progress for safety.

## 2022-09-01 NOTE — ED Notes (Signed)
Report received from RN including SBAR. Patient alert and oriented, warm and dry, in no acute distress. Patient denies SI, HI, AVH and pain. Patient made aware of Q15 minute rounds and security cameras for their safety. Patient reading bible stated she was trying to read the Bible to a Peer because the Peer had told her they were Crystal [name]. Pt redirectable.   Patient instructed to come to this nurse with needs or concerns.

## 2022-09-01 NOTE — ED Notes (Signed)
Snack given.

## 2022-09-01 NOTE — ED Notes (Signed)
Pt given snack. 

## 2022-09-01 NOTE — ED Provider Notes (Signed)
Emergency Medicine Observation Re-evaluation Note  Crystal [name] is a 31 y.o. female, seen in the emergency department for psychiatric complaint.  No acute events since last update  Physical Exam  BP 98/82 (BP Location: Left Arm)   Pulse 86   Temp 97.8 F (36.6 C) (Oral)   Resp 18   Ht '5\' 1"'$  (1.549 m)   Wt 72.6 kg   SpO2 96%   BMI 30.23 kg/m    ED Course / MDM   No recent lab work for review  Plan  Current plan is for placement to a appropriate living facility once available.   Harvest Dark, MD 09/01/22 [phone]

## 2022-09-01 NOTE — ED Notes (Signed)
Patient begging to call guardian, Nurse let her know that she does not have phone privileges, she said she was having anxiety, I let her know that I could call for the chaplin and she said, " yes she wanted to talk to [facility], Patient is safe, will continue to monitor.

## 2022-09-01 NOTE — ED Notes (Signed)
VOL/Pending TOC Placement 

## 2022-09-02 NOTE — ED Provider Notes (Signed)
Emergency Medicine Observation Re-evaluation Note  Crystal [name] is a 31 y.o. female, seen on rounds today.   Physical Exam  BP 96/76   Pulse 85   Temp 98.1 F (36.7 C)   Resp 20   Ht '5\' 1"'$  (1.549 m)   Wt 72.6 kg   SpO2 99%   BMI 30.23 kg/m  Physical Exam General: NAD  ED Course / MDM  EKG:EKG Interpretation  Date/Time:  Saturday July 18 2022 09:31:52 EDT Ventricular Rate:  63 PR Interval:  168 QRS Duration: 70 QT Interval:  392 QTC Calculation: 401 R Axis:   65 Text Interpretation: Normal sinus rhythm with sinus arrhythmia Normal ECG No previous ECGs available Confirmed by UNCONFIRMED, DOCTOR ([id number]), editor Dwaine Deter ([id number]) on 07/20/2022 11:12:51 AM  I have reviewed the labs performed to date as well as medications administered while in observation.    Plan  Current plan is for SW    Merlyn Lot, MD 09/02/22 [phone]

## 2022-09-02 NOTE — TOC Progression Note (Signed)
Transition of Care [facility]) - Progression Note    Patient Details  Name: Crystal [name] MRN: [id number] Date of Birth: [birth year]  [facility]) CM/SW Contact  Shelbie Hutching, RN Phone Number: 09/02/2022, 9:48 AM  Clinical Narrative:    Treatment team meeting with [facility] today at 35, they would like for patient to attend virtually.  [name] with [facility] has been working with [facility] to get an Enhance Rate approve for a 1:1 care to get [place] back. They have to submit a request and SAR as well.     Expected Discharge Plan:  (TBD- patient needs higher level than group home) Barriers to Discharge: ED Unsafe disposition  Expected Discharge Plan and Services Expected Discharge Plan:  (TBD- patient needs higher level than group home) In-house Referral: Chaplain Discharge Planning Services: CM Consult   Living arrangements for the past 2 months: Group Home                 DME Arranged: N/A DME Agency: NA       HH Arranged: NA HH Agency: NA         Social Determinants of Health (SDOH) Interventions    Readmission Risk Interventions     No data to display
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
## 2022-09-02 NOTE — ED Notes (Signed)
Patient dinner meal was given. Patient wanted another patient cheeseburger, RN was notified and was denied request. Patient did not cause any behavioral issues.

## 2022-09-02 NOTE — ED Notes (Signed)
Report received from RN including SBAR. Patient alert and oriented, warm and dry, in no acute distress. Patient denies SI, HI, AVH and pain. Patient made aware of Q15 minute rounds and security cameras for their safety. Patient instructed to come to this nurse with needs or concerns.

## 2022-09-02 NOTE — ED Notes (Signed)
Pt given a snack at this time.  

## 2022-09-02 NOTE — TOC Progression Note (Signed)
Transition of Care [facility]) - Progression Note    Patient Details  Name: Nivea Wojdyla MRN: [id number] Date of Birth: 12-02-1990  Transition of Care [facility]) CM/SW Contact  Shelbie Hutching, RN Phone Number: 09/02/2022, 1:23 PM  Clinical Narrative:    Treatment team meeting included discussion with patient today and she was able to speak with group home owner [name].  [name] from [facility] group home is considering giving patient one more chance to return to the group home.  Team will cont to discuss and apply for additional funds for 1:1.     Expected Discharge Plan:  (TBD- patient needs higher level than group home) Barriers to Discharge: ED Unsafe disposition  Expected Discharge Plan and Services Expected Discharge Plan:  (TBD- patient needs higher level than group home) In-house Referral: Chaplain Discharge Planning Services: CM Consult   Living arrangements for the past 2 months: Group Home                 DME Arranged: N/A DME Agency: NA       HH Arranged: NA HH Agency: NA         Social Determinants of Health (SDOH) Interventions    Readmission Risk Interventions     No data to display

## 2022-09-02 NOTE — ED Notes (Signed)
VOL / pending TOC placement 

## 2022-09-03 NOTE — Progress Notes (Signed)
   09/03/22 1500  Clinical Encounter Type  Visited With Patient  Visit Type Follow-up   Patient requested someone to talk with.

## 2022-09-03 NOTE — ED Notes (Signed)
Hospital meal provided, pt tolerated w/o complaints.  Waste discarded appropriately.  

## 2022-09-03 NOTE — ED Notes (Signed)
Patient breakfast meal was given. Patient haven't taken her shower yet. No behavioral issues.

## 2022-09-03 NOTE — ED Notes (Signed)
Report received from RN including SBAR. Patient alert and oriented, warm and dry, in no acute distress. Patient denies SI, HI, AVH and pain. Patient made aware of Q15 minute rounds and security cameras for their safety. Patient instructed to come to this nurse with needs or concerns.

## 2022-09-03 NOTE — ED Notes (Signed)
Pt sitting in common area, laughing with others and singing christmas carols.

## 2022-09-03 NOTE — ED Notes (Signed)
VOL  TOC  PLACEMENT 

## 2022-09-03 NOTE — ED Provider Notes (Signed)
    09/03/2022    3:26 PM 09/02/2022    7:53 PM 09/02/2022    9:23 AM  Vitals with BMI  Systolic 681 275 92  Diastolic 74 77 69  Pulse 66 86 89    Patient sitting in the common area of the BHU, no distress she is currently waiting TOC placement   Rada Hay, MD 09/03/22 1527

## 2022-09-04 NOTE — ED Notes (Signed)
Pt given snack at this time  
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
## 2022-09-04 NOTE — ED Notes (Signed)
VOL/Pending placement 

## 2022-09-04 NOTE — ED Notes (Signed)
Tylenol given a 1000 am.

## 2022-09-04 NOTE — ED Notes (Signed)
VOL/Pending Placement 

## 2022-09-04 NOTE — ED Provider Notes (Signed)
Emergency Medicine Observation Re-evaluation Note  Caniyah Murley is a 31 y.o. female, seen on rounds today.  Pt initially presented to the ED for complaints of Suicidal  Currently, the pt asleep in bed- no issues per bhu nurse   Physical Exam  Blood pressure 130/84, pulse 78, temperature 98.1 F (36.7 C), temperature source Oral, resp. rate 16, height '5\' 1"'$  (1.549 m), weight 72.6 kg, SpO2 97 %.  Physical Exam General: No apparent distress Pulm: Normal WOB Psych: resting     ED Course / MDM     I have reviewed the labs performed to date as well as medications administered while in observation.  Recent changes in the last 24 hours include none  Plan   Current plan is to continue to wait for palcement  Patient is not under full IVC at this time.   Vanessa Luttrell, MD 09/04/22 [phone]

## 2022-09-05 NOTE — Progress Notes (Signed)
Chaplain responded to request for support.  Pt requested time to talk privately regarding anxieties and concerns over relationships at her group home.  Pt was able to be self-reflective.  Chaplain offered emotional and spiritual support, including prayer. Pt requested printouts of word searches, carols and coloring pages.  Chaplain returned later with some printouts.  With other patients, chaplain, carols were sung.    Please contact as needed for further support.   Minus Liberty, [place] Pager:  [phone]    09/05/22 1206  Clinical Encounter Type  Visited With Patient  Visit Type Initial;Spiritual support;Psychological support  Referral From Patient  Consult/Referral To Chaplain  Spiritual Encounters  Spiritual Needs Prayer  Stress Factors  Patient Stress Factors Loss of control

## 2022-09-05 NOTE — ED Notes (Signed)
Pt given supplies for shower

## 2022-09-05 NOTE — ED Provider Notes (Signed)
    09/05/2022    9:32 AM 09/04/2022    7:43 PM 09/04/2022   10:05 AM  Vitals with BMI  Systolic 763 943 200  Diastolic 69 80 59  Pulse 79 66 80    There have been no acute events during my shift.  Patient is awaiting social work placement.   Rada Hay, MD 09/05/22 1330

## 2022-09-05 NOTE — ED Notes (Signed)
Pt is in dayroom with chaplain

## 2022-09-05 NOTE — ED Notes (Signed)
Pt given breakfast tray

## 2022-09-05 NOTE — ED Notes (Signed)
Pt given lunch tray and sprite

## 2022-09-05 NOTE — ED Notes (Signed)
Patient is currently in the dayroom. No behavorial issues.

## 2022-09-05 NOTE — ED Notes (Signed)
Pt given dinner tray and water. 

## 2022-09-05 NOTE — ED Notes (Signed)
Chaplain in room to speak with pt

## 2022-09-06 NOTE — ED Notes (Signed)
Patient refused snack but wanted something to drink.

## 2022-09-06 NOTE — ED Notes (Signed)
Patient is vol pending TOC placement 

## 2022-09-06 NOTE — ED Notes (Signed)
Pt given lunch tray and water at this time.

## 2022-09-06 NOTE — ED Provider Notes (Signed)
Emergency Medicine Observation Re-evaluation Note  Parys Elenbaas is a 31 y.o. female, seen on rounds today.  Pt initially presented to the ED for complaints of Suicidal Currently, the patient is resting comfortably.  Physical Exam  BP 116/77   Pulse 73   Temp 98.1 F (36.7 C)   Resp 16   Ht '5\' 1"'$  (1.549 m)   Wt 72.6 kg   SpO2 99%   BMI 30.23 kg/m  Physical Exam General: No acute distress Cardiac: Well-perfused extremities Lungs: No respiratory distress Psych: Appropriate mood and affect  ED Course / MDM  EKG:EKG Interpretation  Date/Time:  Saturday July 18 2022 09:31:52 EDT Ventricular Rate:  63 PR Interval:  168 QRS Duration: 70 QT Interval:  392 QTC Calculation: 401 R Axis:   65 Text Interpretation: Normal sinus rhythm with sinus arrhythmia Normal ECG No previous ECGs available Confirmed by UNCONFIRMED, DOCTOR ([id number]), editor Dwaine Deter ([id number]) on 07/20/2022 11:12:51 AM  I have reviewed the labs performed to date as well as medications administered while in observation.  Recent changes in the last 24 hours include none.  Plan  Current plan is for placement.    Naaman Plummer, MD 09/06/22 [phone]

## 2022-09-06 NOTE — ED Notes (Signed)
VOL / pending TOC placement 

## 2022-09-07 MED ORDER — HYDROXYZINE HCL 25 MG PO TABS
50.0000 mg | ORAL_TABLET | Freq: Once | ORAL | Status: AC
  Administered 2022-09-07: 50 mg via ORAL
  Filled 2022-09-07: qty 2

## 2022-09-07 NOTE — ED Notes (Signed)
Pt complained of a headache and allergy complaints. Prn given

## 2022-09-07 NOTE — ED Notes (Signed)
vol/pending toc placement.. 

## 2022-09-07 NOTE — ED Notes (Signed)
Pt repeat BP was as charted. Secure chat sent to Dr. Jacelyn Grip, pt has no complaints

## 2022-09-07 NOTE — ED Notes (Signed)
Chaplain at bedside speaking with patient.

## 2022-09-07 NOTE — ED Notes (Signed)
Pt given a lunch tray. 

## 2022-09-07 NOTE — ED Notes (Signed)
Pt has reported lower BP from prior shift. Pt is interactive and playful in day room during report. Vitals will be recollected shortly. She has eaten entire sandwich tray.

## 2022-09-07 NOTE — ED Notes (Signed)
Pt in day room with chaplain
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
## 2022-09-07 NOTE — ED Provider Notes (Signed)
Emergency Medicine Observation Re-evaluation Note  Crystal [name] is a 31 y.o. female, seen on rounds today.  Pt initially presented to the ED for complaints of Suicidal  Currently, the patient is calm, no acute complaints.  Physical Exam  Blood pressure 118/81, pulse 83, temperature 98.1 F (36.7 C), temperature source Oral, resp. rate 18, height '5\' 1"'$  (1.549 m), weight 72.6 kg, SpO2 98 %. Physical Exam General: NAD Lungs: CTAB Psych: not agitated  ED Course / MDM  EKG:    I have reviewed the labs performed to date as well as medications administered while in observation.  Recent changes in the last 24 hours include no acute events overnight.    Plan  Current plan is for social work placement. Patient is not under full IVC at this time.   Carrie Mew, MD 09/07/22 1001
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
## 2022-09-07 NOTE — ED Notes (Signed)
VOL  TOC  PLACEMENT 

## 2022-09-07 NOTE — ED Notes (Addendum)
Pt BP standing reading 89/61. Pt given sandwich tray and drink and RN will retake BP after consumed. RN will report to oncoming RN. MD made aware. No new orders at this time

## 2022-09-08 NOTE — Progress Notes (Signed)
  Chaplain On-Call provided spiritual and emotional support for [facility], a long-term admission patient who is known to all the Chaplains.  Tashona gave a list of Hymns to the Chaplain with the hope of receiving printed copies of lyrics. Chaplain will refer this request to the Evening Chaplain for follow-up.  Chaplain offered encouragement and prayer.  Chaplain Pollyann Samples M.Div., [facility]

## 2022-09-08 NOTE — ED Notes (Signed)
EKG provided to Dr. Jari Pigg by this nurse and spoke with her, vitals obtained. No further actions at this time per Dr. Jari Pigg. Will report to next shift and ask them to continue to monitor.

## 2022-09-08 NOTE — ED Provider Notes (Signed)
Emergency Medicine Observation Re-evaluation Note  Crystal [name] is a 31 y.o. female, seen on rounds today.  Pt initially presented to the ED for complaints of Suicidal  Currently, the patient is is no acute distress. Denies any concerns at this time.  Physical Exam  Blood pressure 95/66, pulse 79, temperature 97.9 F (36.6 C), temperature source Oral, resp. rate 20, height '5\' 1"'$  (1.549 m), weight 72.6 kg, SpO2 98 %.  Physical Exam: General: No apparent distress Pulm: Normal WOB Neuro: Moving all extremities Psych: Resting comfortably     ED Course / MDM     I have reviewed the labs performed to date as well as medications administered while in observation.  Recent changes in the last 24 hours include: No acute events overnight.  Plan   Current plan: Patient awaiting social work disposition. Patient is not under full IVC at this time.    Nathaniel Man, MD 09/08/22 [phone]

## 2022-09-08 NOTE — ED Notes (Signed)
Pt given pm snack and drink

## 2022-09-08 NOTE — ED Notes (Signed)
Pt is having to constantly be told to back away from staff and door, to give staff personal space. Pt also given shower supplies, new beh scrubs, mesh underwear and socks. Pt was given a med cup with tooth paste, pt is requesting a tube of toothpaste, this tech informed pt that the cup of toothpaste would be what she is getting as the other pt who showered previously got that as well. Pt also requesting scrub pants that are not paper in size XL or L. No scrub pants that are not paper are available in the unit in those sizes. Pt advised of this.

## 2022-09-08 NOTE — ED Notes (Signed)
VOL/pending placement 

## 2022-09-08 NOTE — ED Notes (Signed)
Pt given cloth scrub pants from main ED scrub supply closet. Pt currently getting into the shower now.

## 2022-09-08 NOTE — ED Provider Notes (Signed)
EKG normal sinus rate of 70 without any ST elevation or T wave inversions, normal intervals   Vanessa Naguabo, MD 09/08/22 [phone]

## 2022-09-08 NOTE — ED Notes (Signed)
Pt asked this tech to come to her room so pt could tell tech about a problem she has been having. Tech remained in the doorway while pt went into her room. Pt proceeded to lift up the front of her shirt and expose her chest. Pt then pointed out small red bumps in the middle of her chest. Pt was instructed to put her shirt back down and pt did so. Tech listened to pt and pt then said her belly was hurting and it "feels like cramps." Pt then proceeded to pull the front of her pants down to show the tech. Tech instructed pt to pull her pants back up.   Tech told pt to let the nurse know if these issues persist.

## 2022-09-08 NOTE — ED Notes (Signed)
Patient received breakfast tray and beverage, no signs of distress at this time.

## 2022-09-08 NOTE — ED Notes (Signed)
Pt now back up and states she is light headed and feels she has circles going through her head. Vitals to be obtained at this time, EKG machine is being retrieved

## 2022-09-08 NOTE — ED Notes (Signed)
Pt provided hospital dinner tray and a fresh cup of cranberry juice and sprite, per the pts request. Pt accepted the meal tray and went back to her room to eat.

## 2022-09-08 NOTE — ED Notes (Signed)
Pt up and states she has sharp pain in the center of her chest that goes around under her left breast. Secure chat to Dr. Jari Pigg for further recommendations.

## 2022-09-08 NOTE — Progress Notes (Signed)
Follow up from previous chaplain Pollyann Samples. Patient requested song lyrics to be printed for specific songs. I was able to find and print most of the request.

## 2022-09-08 NOTE — ED Notes (Signed)
Patient received snack, no signs of distress.

## 2022-09-08 NOTE — ED Notes (Signed)
Patient talked to nurse and she states " I had rather stay here than to have to go back to the group home I was at, I don't want to go back there, Nurse let her know there no new updates at this time.

## 2022-09-09 NOTE — ED Notes (Signed)

## 2022-09-09 NOTE — ED Provider Notes (Signed)
Emergency Medicine Observation Re-evaluation Note  Crystal [name] is a 31 y.o. female, seen on rounds today.  Pt initially presented to the ED for complaints of Suicidal   Physical Exam  BP 119/78 (BP Location: Right Arm)   Pulse 78   Temp 97.7 F (36.5 C) (Oral)   Resp 19   Ht '5\' 1"'$  (1.549 m)   Wt 72.6 kg   SpO2 98%   BMI 30.23 kg/m  Physical Exam General: no distress Lungs: no increased wob Psych: calm and cooperative  ED Course / MDM  EKG:EKG Interpretation  Date/Time:  Tuesday September 08 2022 06:29:37 EST Ventricular Rate:  70 PR Interval:  178 QRS Duration: 86 QT Interval:  412 QTC Calculation: 444 R Axis:   35 Text Interpretation: Normal sinus rhythm Normal ECG When compared with ECG of 18-Jul-2022 09:31, No significant change was found Confirmed by UNCONFIRMED, DOCTOR ([id number]), editor Antonieta Iba [phone]) on 09/08/2022 7:57:06 AM  I have reviewed the labs performed to date as well as medications administered while in observation.  Recent changes in the last 24 hours include none.  Plan  Current plan is for SW disposition.    Rada Hay, MD 09/09/22 [phone]

## 2022-09-09 NOTE — ED Notes (Signed)
VOL/Pending Placement 

## 2022-09-10 NOTE — ED Notes (Signed)
Pt given breakfast tray

## 2022-09-10 NOTE — ED Provider Notes (Signed)
Emergency Medicine Observation Re-evaluation Note  Physical Exam   BP 113/80   Pulse 73   Temp 98.6 F (37 C)   Resp 18   Ht '5\' 1"'$  (1.549 m)   Wt 72.6 kg   SpO2 100%   BMI 30.23 kg/m   Pt is calm and resting, respirations unlabored, and appears comfortable.   ED Course / MDM   No reported events during my shift at the time of this note.   Pt is awaiting dispo from Livingston Wheeler MD    Lucillie Garfinkel, MD 09/10/22 [phone]

## 2022-09-10 NOTE — ED Notes (Signed)
Dinner tray given to pt

## 2022-09-10 NOTE — ED Notes (Signed)
VOL/pending placement 

## 2022-09-11 NOTE — ED Notes (Signed)
Pt resting in her room at this time. Respirations even and unlabored.

## 2022-09-11 NOTE — ED Notes (Signed)
Chaplain paged per pt request.

## 2022-09-11 NOTE — ED Notes (Signed)
vol/pending toc placement.. 

## 2022-09-11 NOTE — ED Notes (Signed)
PT REMAINS VOL, W/ TOC PLACEMENT PENDING. NO NEW NOTES. MOST RECENT NOTE ON CLIPBOARD

## 2022-09-11 NOTE — ED Notes (Signed)
Pt given lunch tray and drink at this time.

## 2022-09-11 NOTE — ED Notes (Signed)
VOL  TOC  PLACEMENT 

## 2022-09-11 NOTE — ED Provider Notes (Signed)
Emergency Medicine Observation Re-evaluation Note  Physical Exam   BP 116/79 (BP Location: Right Arm)   Pulse 72   Temp 97.9 F (36.6 C) (Oral)   Resp 16   Ht '5\' 1"'$  (1.549 m)   Wt 72.6 kg   SpO2 98%   BMI 30.23 kg/m   Pt is calm and resting, respirations unlabored, and appears comfortable.   ED Course / MDM   No reported events during my shift at the time of this note.   Pt is awaiting dispo from SW   Lucillie Garfinkel MD    Lucillie Garfinkel, MD 09/11/22 [phone]

## 2022-09-11 NOTE — ED Notes (Signed)
Pt given snack and drink.  Pt eating in room

## 2022-09-11 NOTE — Progress Notes (Signed)
Contacted by staff to visit with patient. Continued support for this patient. Gave space for patient to share and fulfilled request for prayer. Patient had another written list of songs to be printed out.

## 2022-09-12 NOTE — ED Notes (Signed)
Called to room by pt who states that "me and Jamol just got into it, I told him I have HIV, I know I should not have, but I did and now he is talking all about it and and that is discrimination, he can't discriminate against me because of my disease."  Pt informed that this RN can not control what the other patient is saying and that she should just stay away from him.  Patient then states "he is telling Zenia Resides and Estill Bamberg and they were talking about me".  This RN asked pt what she wanted done to change the situation, patient states she wants to talk to her social worker about moving to another emergency room.  Pt  reminded that it is not possible to move hospitals and that as it was Saturday and a holiday there would be no social work until Tuesday and that this RN would leave a message to have them talk with her.  Pt then asks for something for her anxiety.

## 2022-09-12 NOTE — ED Notes (Signed)
Attempted to call numbers listed in chart for legal guardian. No answer.

## 2022-09-12 NOTE — ED Notes (Signed)
VOL / pending TOC placement 

## 2022-09-12 NOTE — ED Notes (Addendum)
Pt urinated on room floor while laughing. Nt informed pt she has to clean up urine. Pt cleaned up urine with towels.
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
## 2022-09-12 NOTE — ED Notes (Signed)
Pt refused vitals 

## 2022-09-12 NOTE — ED Provider Notes (Signed)
Emergency Medicine Observation Re-evaluation Note  Crystal [name] is a 31 y.o. female, seen on rounds today.  Pt initially presented to the ED for complaints of Suicidal Currently, the patient is resting comfortably.  Physical Exam  BP 95/79 (BP Location: Left Arm)   Pulse 76   Temp 97.9 F (36.6 C) (Oral)   Resp 18   Ht '5\' 1"'$  (1.549 m)   Wt 72.6 kg   SpO2 99%   BMI 30.23 kg/m  Physical Exam General: No acute distress Cardiac: Well-perfused extremities Lungs: No respiratory distress Psych: Appropriate mood and affect  ED Course / MDM  EKG:EKG Interpretation  Date/Time:  Tuesday September 08 2022 06:29:37 EST Ventricular Rate:  70 PR Interval:  178 QRS Duration: 86 QT Interval:  412 QTC Calculation: 444 R Axis:   35 Text Interpretation: Normal sinus rhythm Normal ECG When compared with ECG of 18-Jul-2022 09:31, No significant change was found Confirmed by UNCONFIRMED, DOCTOR ([id number]), editor Antonieta Iba [phone]) on 09/08/2022 7:57:06 AM  I have reviewed the labs performed to date as well as medications administered while in observation.  Recent changes in the last 24 hours include none.  Plan  Current plan is for placement.    Naaman Plummer, MD 09/12/22 [phone]

## 2022-09-12 NOTE — ED Notes (Signed)
Patient standing in doorway talking with security staff.
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
## 2022-09-12 NOTE — ED Notes (Signed)
Patient went to corner of her room and urinated in the floor.  Staff informed patient that she would have to clean up the mess.

## 2022-09-12 NOTE — ED Notes (Signed)
Pt showered and provided new scrubs, all items returned to this tech.

## 2022-09-12 NOTE — ED Notes (Signed)
Pt given breakfast tray and cranberry juice.

## 2022-09-12 NOTE — ED Notes (Signed)
Called [employer] for update, they are extremely busy but will send an officer asap.

## 2022-09-13 ENCOUNTER — Other Ambulatory Visit: Payer: Self-pay

## 2022-09-13 ENCOUNTER — Emergency Department
Admission: EM | Admit: 2022-09-13 | Discharge: 2022-10-21 | Disposition: A | Discharge status: Home / Self Care | Payer: Medicaid Other | Source: Home / Self Care | Attending: Emergency Medicine | Admitting: Emergency Medicine

## 2022-09-13 ENCOUNTER — Encounter: Payer: Self-pay | Admitting: Emergency Medicine

## 2022-09-13 DIAGNOSIS — R109 Unspecified abdominal pain: Secondary | ICD-10-CM | POA: Insufficient documentation

## 2022-09-13 DIAGNOSIS — Z139 Encounter for screening, unspecified: Secondary | ICD-10-CM | POA: Insufficient documentation

## 2022-09-13 DIAGNOSIS — Z20822 Contact with and (suspected) exposure to covid-19: Secondary | ICD-10-CM | POA: Insufficient documentation

## 2022-09-13 DIAGNOSIS — Z609 Problem related to social environment, unspecified: Secondary | ICD-10-CM | POA: Insufficient documentation

## 2022-09-13 MED ORDER — ACETAMINOPHEN 325 MG PO TABS
650.0000 mg | ORAL_TABLET | Freq: Four times a day (QID) | ORAL | Status: DC | PRN
  Administered 2022-09-14 – 2022-10-20 (×45): 650 mg via ORAL
  Filled 2022-09-13 (×47): qty 2

## 2022-09-13 MED ORDER — DIPHENHYDRAMINE HCL 50 MG/ML IJ SOLN
25.0000 mg | Freq: Once | INTRAMUSCULAR | Status: AC
  Administered 2022-09-13: 25 mg via INTRAMUSCULAR
  Filled 2022-09-13: qty 1

## 2022-09-13 MED ORDER — PRAZOSIN HCL 1 MG PO CAPS: 2.0000 mg | ORAL_CAPSULE | Freq: Every day | ORAL | Status: DC

## 2022-09-13 MED ORDER — SUVOREXANT 10 MG PO TABS: 1.0000 | ORAL_TABLET | Freq: Every day | ORAL | Status: DC

## 2022-09-13 MED ORDER — LORATADINE 10 MG PO TABS
10.0000 mg | ORAL_TABLET | Freq: Every day | ORAL | Status: DC | PRN
  Administered 2022-09-14 – 2022-10-21 (×38): 10 mg via ORAL
  Filled 2022-09-13 (×42): qty 1

## 2022-09-13 MED ORDER — SERTRALINE HCL 50 MG PO TABS: 150.0000 mg | ORAL_TABLET | Freq: Every day | ORAL | Status: DC

## 2022-09-13 MED ORDER — LORAZEPAM 2 MG/ML IJ SOLN
1.0000 mg | Freq: Once | INTRAMUSCULAR | Status: AC
  Administered 2022-09-13: 1 mg via INTRAMUSCULAR
  Filled 2022-09-13: qty 1

## 2022-09-13 MED ORDER — MELATONIN 5 MG PO TABS
5.0000 mg | ORAL_TABLET | Freq: Every day | ORAL | Status: DC
  Administered 2022-09-14 – 2022-10-20 (×38): 5 mg via ORAL
  Filled 2022-09-13 (×38): qty 1

## 2022-09-13 MED ORDER — HYDROXYZINE HCL 25 MG PO TABS: 100.0000 mg | ORAL_TABLET | Freq: Every day | ORAL | Status: DC

## 2022-09-13 MED ORDER — LINACLOTIDE 145 MCG PO CAPS: 145.0000 ug | ORAL_CAPSULE | Freq: Every day | ORAL | Status: DC

## 2022-09-13 MED ORDER — HALOPERIDOL LACTATE 5 MG/ML IJ SOLN
5.0000 mg | Freq: Once | INTRAMUSCULAR | Status: AC
  Administered 2022-09-13: 5 mg via INTRAMUSCULAR
  Filled 2022-09-13: qty 1

## 2022-09-13 MED ORDER — GABAPENTIN 100 MG PO CAPS
100.0000 mg | ORAL_CAPSULE | Freq: Three times a day (TID) | ORAL | Status: DC | PRN
  Administered 2022-09-22 – 2022-10-18 (×21): 100 mg via ORAL
  Filled 2022-09-13 (×21): qty 1

## 2022-09-13 MED ORDER — RISPERIDONE 1 MG PO TABS
1.0000 mg | ORAL_TABLET | Freq: Two times a day (BID) | ORAL | Status: DC
  Administered 2022-09-14 – 2022-10-21 (×76): 1 mg via ORAL
  Filled 2022-09-13 (×76): qty 1

## 2022-09-13 MED ORDER — ARIPIPRAZOLE 10 MG PO TABS: 20.0000 mg | ORAL_TABLET | Freq: Every day | ORAL | Status: DC

## 2022-09-13 MED ORDER — DOLUTEGRAVIR-LAMIVUDINE 50-300 MG PO TABS: 1.0000 | ORAL_TABLET | Freq: Every day | ORAL | Status: DC

## 2022-09-13 MED ORDER — BUSPIRONE HCL 10 MG PO TABS
30.0000 mg | ORAL_TABLET | Freq: Two times a day (BID) | ORAL | Status: DC
  Administered 2022-09-14 – 2022-10-21 (×76): 30 mg via ORAL
  Filled 2022-09-13 (×57): qty 3
  Filled 2022-09-13: qty 6
  Filled 2022-09-13 (×18): qty 3

## 2022-09-13 MED ORDER — LORAZEPAM 2 MG PO TABS
2.0000 mg | ORAL_TABLET | ORAL | Status: DC | PRN
  Administered 2022-09-13: 2 mg via ORAL
  Filled 2022-09-13 (×2): qty 1

## 2022-09-13 MED ORDER — SERTRALINE HCL 100 MG PO TABS
200.0000 mg | ORAL_TABLET | Freq: Every day | ORAL | Status: DC
  Administered 2022-09-14 – 2022-10-21 (×38): 200 mg via ORAL
  Filled 2022-09-13 (×38): qty 2

## 2022-09-13 MED ORDER — VITAMIN B-12 1000 MCG PO TABS: 1000.0000 ug | ORAL_TABLET | Freq: Every day | ORAL | Status: DC

## 2022-09-13 MED ORDER — RISPERIDONE ER 120 MG ~~LOC~~ PRSY: 1.0000 | PREFILLED_SYRINGE | SUBCUTANEOUS | Status: DC

## 2022-09-13 MED ORDER — LORAZEPAM 2 MG PO TABS
2.0000 mg | ORAL_TABLET | ORAL | Status: DC | PRN
  Administered 2022-09-18 – 2022-09-19 (×2): 2 mg via ORAL
  Filled 2022-09-13 (×3): qty 1

## 2022-09-13 MED ORDER — ATORVASTATIN CALCIUM 20 MG PO TABS: 20.0000 mg | ORAL_TABLET | Freq: Every day | ORAL | Status: DC

## 2022-09-13 MED ORDER — ZOLPIDEM TARTRATE 5 MG PO TABS
5.0000 mg | ORAL_TABLET | Freq: Every evening | ORAL | Status: DC | PRN
  Filled 2022-09-13: qty 1

## 2022-09-13 MED ORDER — IBUPROFEN 800 MG PO TABS
400.0000 mg | ORAL_TABLET | Freq: Four times a day (QID) | ORAL | Status: DC | PRN
  Administered 2022-09-16 – 2022-10-20 (×24): 400 mg via ORAL
  Filled 2022-09-13 (×27): qty 1

## 2022-09-13 NOTE — Discharge Instructions (Signed)
You have been seen in the emergency department for a  psychiatric concern. You have been evaluated both medically as well as psychiatrically. Please follow-up with your outpatient resources provided. Return to the emergency department for any worsening symptoms, or any thoughts of hurting yourself or anyone else so that we may attempt to help you. 
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
## 2022-09-13 NOTE — ED Provider Notes (Signed)
Emergency Medicine Observation Re-evaluation Note  Crystal [name] is a 31 y.o. female, seen on rounds today.  Pt initially presented to the ED for complaints of Suicidal Currently, the patient is calm, resting.  Physical Exam  BP 96/76 (BP Location: Right Arm)   Pulse 82   Temp 97.6 F (36.4 C) (Oral)   Resp 18   Ht '5\' 1"'$  (1.549 m)   Wt 72.6 kg   SpO2 96%   BMI 30.23 kg/m    ED Course / MDM  EKG:EKG Interpretation  Date/Time:  Tuesday September 08 2022 06:29:37 EST Ventricular Rate:  70 PR Interval:  178 QRS Duration: 86 QT Interval:  412 QTC Calculation: 444 R Axis:   35 Text Interpretation: Normal sinus rhythm Normal ECG When compared with ECG of 18-Jul-2022 09:31, No significant change was found Confirmed by UNCONFIRMED, DOCTOR ([id number]), editor Antonieta Iba [id number]) on 09/08/2022 7:57:06 AM  I have reviewed the labs performed to date as well as medications administered while in observation.  Recent changes in the last 24 hours include none.  Plan  Current plan is for SW disposition.    Duffy Bruce, MD 09/13/22 1051
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
## 2022-09-13 NOTE — ED Notes (Addendum)
Pt redressed out into appropriate hospital provided attire. Pt belongings consist of: black tennis shoes, burgundy sweat pants, a bra, and a black shirt. Pt calm and cooperative while dressing out. Pt belongings placed into one pt belongings bag.   Pt has a court date of January 24 that she wants staff to be aware of so that she does not miss court.
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
## 2022-09-13 NOTE — ED Notes (Signed)
Pt to door stating she has peed herself and needs new pants. Pt given new underwear and pants.

## 2022-09-13 NOTE — ED Notes (Signed)
Pt refusing vitals.

## 2022-09-13 NOTE — ED Notes (Signed)
Patient up to door states she is feeling anxious.  Told patient that this RN would message MD about getting her something for anxiety.  Patient also states that she feels like she wants to be violent so she can go to jail.

## 2022-09-13 NOTE — ED Notes (Signed)
Patient is vol pending placement 
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
## 2022-09-13 NOTE — ED Notes (Signed)
VOL/Pending Placement 

## 2022-09-13 NOTE — ED Notes (Addendum)
Pt is pacing and yelling profanities. Attempted to redirect already mutliple times. Pt kept stating "take me to jail. I want to go to jail to be with my friend".

## 2022-09-14 MED ORDER — LAMIVUDINE 150 MG PO TABS
300.0000 mg | ORAL_TABLET | Freq: Every day | ORAL | Status: DC
  Administered 2022-09-14 – 2022-10-21 (×39): 300 mg via ORAL
  Filled 2022-09-14 (×39): qty 2

## 2022-09-14 MED ORDER — DOLUTEGRAVIR SODIUM 50 MG PO TABS
50.0000 mg | ORAL_TABLET | Freq: Every day | ORAL | Status: DC
  Administered 2022-09-14 – 2022-10-21 (×39): 50 mg via ORAL
  Filled 2022-09-14 (×39): qty 1

## 2022-09-14 NOTE — ED Notes (Signed)
Patient ask for tylenol for headache. Nurse obtained.

## 2022-09-14 NOTE — ED Notes (Signed)
Patient is in the dayroom coloring. No behavorial issues, cooperative and calm.

## 2022-09-14 NOTE — Progress Notes (Signed)
   09/14/22 1900  Clinical Encounter Type  Visited With Patient  Visit Type Follow-up  Referral From Nurse  Consult/Referral To Chaplain   Chaplain provided compassionate presence and reflective listening as patient spoke about challenges the past few days. Chaplain offered more constructive ways of coping with stressors in [place]. Patient expressed remorse over recent behaviors. Patient requested song lyrics form Chaplain. Chaplain will follow up request. Chaplain services are available as needed.

## 2022-09-14 NOTE — ED Notes (Signed)
vol/pt returned lastnight /pending toc placement per rn wendy .

## 2022-09-14 NOTE — ED Notes (Signed)
Chaplain in with pt per RN.

## 2022-09-14 NOTE — ED Notes (Signed)
Snack given.

## 2022-09-14 NOTE — ED Notes (Signed)
Pt given one snack and a cup of water at this time.

## 2022-09-14 NOTE — ED Notes (Signed)
Patient is talking with the chaplin, she is being calm and cooperative, no signs of distress, no behavioral issues noted.

## 2022-09-14 NOTE — ED Notes (Signed)
Patient is calm and cooperative, no behavioral issues, staff will continue to monitor, Patient is safe, q 15 minute checks and camera surveillance in progress for safety.

## 2022-09-15 NOTE — ED Notes (Signed)
Pt observed in the dayroom with another patient

## 2022-09-15 NOTE — ED Notes (Signed)
Pt speaking with the chaplain who brought pt song lyrics to read and sing to

## 2022-09-15 NOTE — Progress Notes (Signed)
   09/15/22 1300  Clinical Encounter Type  Visited With Patient  Visit Type Follow-up  Referral From Nurse  Consult/Referral To Chaplain   Chaplain visited with patient and provided printed pages of song lyrics patient requested yesterday. Chaplain provided a compassionate presence and reflective listening as patient spoke about negative patterns of thinking. Chaplain explored ways to develop positive patterns to replace the negative ones. Chaplain services are available for follow up as needed.

## 2022-09-15 NOTE — ED Notes (Signed)
Hospital meal provided, pt tolerated w/o complaints.  Waste discarded appropriately.  

## 2022-09-15 NOTE — ED Notes (Signed)
Pt given p.m. snack and drink

## 2022-09-15 NOTE — ED Notes (Signed)
VOL / pending TOC placement 

## 2022-09-15 NOTE — ED Notes (Signed)
Pt given breakfast tray and water

## 2022-09-15 NOTE — ED Notes (Signed)
Pt given her dinner tray.

## 2022-09-15 NOTE — ED Notes (Signed)
VOL/pending placement 

## 2022-09-15 NOTE — ED Provider Notes (Signed)
Emergency Medicine Observation Re-evaluation Note  Crystal [name] is a 31 y.o. female, seen in the emergency department for behavioral/psychiatric complaint.  No acute events since last update.  Physical Exam  BP 108/78 (BP Location: Right Arm)   Pulse 90   Temp 97.8 F (36.6 C) (Oral)   Resp 16   Ht '5\' 1"'$  (1.549 m)   Wt 73 kg   SpO2 97%   BMI 30.41 kg/m    ED Course / MDM   No recent lab work for review  Plan   Current plan is for patient to an appropriate living facility once available.    Harvest Dark, MD 09/15/22 [phone]

## 2022-09-15 NOTE — BH Assessment (Signed)
TTS consult placed on 09/14/22 but there is no medical note stating the reason for the consult nor is there a psyc assessment stating that patient is recommended for Inpatient psyc. Patient currently has a TOC consult for placement at an appropriate living facility. TTS only seeks placement for patients that are in need of psyc inpatient.  Re-consult TTS if patient is need of Psyc inpatient

## 2022-09-16 NOTE — ED Notes (Addendum)
Hospital meal provided, pt tolerated w/o complaints.  Waste discarded appropriately.  

## 2022-09-16 NOTE — Progress Notes (Signed)
Met with and spent significant time with patient. Allowing space for her to share about recent events and some of the root causes. Forbes Cellar continues to request song copies which we will provide along with supportive care.

## 2022-09-16 NOTE — ED Notes (Signed)
VOL  PENDING  PLACEMENT 

## 2022-09-16 NOTE — ED Provider Notes (Signed)
Emergency Medicine Observation Re-evaluation Note  Crystal [name] is a 31 y.o. female, seen in the emergency department for behavioral/psychiatric complaint.  No acute events since last update.  Asleep, arouses easily to voice.  Reports no concerns or questions for myself as the physician.  Physical Exam  BP 110/73 (BP Location: Left Arm)   Pulse 79   Temp 98.2 F (36.8 C) (Oral)   Resp 17   Ht '5\' 1"'$  (1.549 m)   Wt 73 kg   SpO2 98%   BMI 30.41 kg/m   Easily arouses.  Initially sleeping with even unlabored respirations.  Awakens without distress, voices no concerns.  ED Course / MDM   No recent lab work for review  Plan   Current plan is for patient to an appropriate living facility once available.     Delman Kitten, MD 09/16/22 [phone]

## 2022-09-16 NOTE — ED Notes (Signed)
Pt received snack and drink. Pt has no other needs at this moment.

## 2022-09-16 NOTE — ED Notes (Signed)
Pt given snack. 

## 2022-09-17 ENCOUNTER — Ambulatory Visit: Payer: Medicaid Other | Admitting: Infectious Diseases

## 2022-09-17 MED ORDER — SENNOSIDES-DOCUSATE SODIUM 8.6-50 MG PO TABS: 1.0000 | ORAL_TABLET | Freq: Every day | ORAL | Status: DC | PRN

## 2022-09-17 MED ORDER — POLYETHYLENE GLYCOL 3350 17 G PO PACK
17.0000 g | PACK | Freq: Every day | ORAL | Status: DC | PRN
  Administered 2022-10-18: 17 g via ORAL
  Filled 2022-09-17: qty 1

## 2022-09-17 NOTE — ED Notes (Signed)
Pt given shower supplies, currently in shower.

## 2022-09-17 NOTE — ED Notes (Signed)
Pt given snack and water at this time.

## 2022-09-17 NOTE — ED Notes (Signed)
Snack and drink given

## 2022-09-17 NOTE — ED Notes (Signed)
Pt out of shower at this time.

## 2022-09-17 NOTE — ED Notes (Signed)
Hospital meal provided, pt tolerated w/o complaints.  Waste discarded appropriately.  

## 2022-09-17 NOTE — ED Notes (Signed)
Pt refused snack, only given water at this time.

## 2022-09-17 NOTE — ED Provider Notes (Signed)
Emergency Medicine Observation Re-evaluation Note  Zuly Belkin is a 31 y.o. female, seen in the emergency department for behavioral/psychiatric complaint.  Fully alert without distress  Physical Exam  BP 105/79 (BP Location: Left Arm)   Pulse 98   Temp (!) 97.5 F (36.4 C) (Oral)   Resp 20   Ht '5\' 1"'$  (1.549 m)   Wt 73 kg   SpO2 98%   BMI 30.41 kg/m   Ambulatory in the room.  Tells me that she has had a little bit of a stomach upset she feels constipated.  She reports that she has been passing gas but not had a bowel movement for a couple of days now.  Normal respiratory pattern.  Clear sentences.  Abdomen is soft nondistended throughout.  She does report mild tenderness but is very nonfocal.  There is no focal right lower quadrant tenderness no suprapubic pain or discomfort.  She reports she just feels "constipated" reports she has not had a bowel movement for about 2 to 3 days.  No evidence of peritonitis.  No fevers.  ED Course / MDM   No recent lab work for review  Plan   Current plan is for patient to an appropriate living facility once available.  Continue to follow for abdominal symptoms, at this point suspect may be mild constipation.  Will start on MiraLAX and senna as needed.  Discussed with nurse Seth Bake.  Notified patient of can more concerning symptoms and if she is to start experiencing any severe pain vomiting fevers or other symptoms she will notify us    Delman Kitten, MD 09/17/22 [phone]

## 2022-09-17 NOTE — ED Notes (Addendum)
Pt given lunch tray and drink at this time.

## 2022-09-17 NOTE — Progress Notes (Signed)
   09/17/22 1500  Clinical Encounter Type  Visited With Health care provider  Visit Type Follow-up   Chaplain provided emotional support through presence and conversation.

## 2022-09-17 NOTE — ED Notes (Signed)
vol/pending placement.. 

## 2022-09-17 NOTE — ED Notes (Signed)
Pt given breakfast tray and drink at this time. 

## 2022-09-18 ENCOUNTER — Emergency Department: Payer: Medicaid Other

## 2022-09-18 LAB — CBC WITH DIFFERENTIAL/PLATELET
Abs Immature Granulocytes: 0.07 10*3/uL (ref 0.00–0.07)
Basophils Absolute: 0.1 10*3/uL (ref 0.0–0.1)
Basophils Relative: 1 %
Eosinophils Absolute: 0.1 10*3/uL (ref 0.0–0.5)
Eosinophils Relative: 1 %
HCT: 39 % (ref 36.0–46.0)
Hemoglobin: 12.9 g/dL (ref 12.0–15.0)
Immature Granulocytes: 1 %
Lymphocytes Relative: 33 %
Lymphs Abs: 2.8 10*3/uL (ref 0.7–4.0)
MCH: 32.8 pg (ref 26.0–34.0)
MCHC: 33.1 g/dL (ref 30.0–36.0)
MCV: 99.2 fL (ref 80.0–100.0)
Monocytes Absolute: 0.8 10*3/uL (ref 0.1–1.0)
Monocytes Relative: 9 %
Neutro Abs: 4.8 10*3/uL (ref 1.7–7.7)
Neutrophils Relative %: 55 %
Platelets: 247 10*3/uL (ref 150–400)
RBC: 3.93 MIL/uL (ref 3.87–5.11)
RDW: 12.3 % (ref 11.5–15.5)
WBC: 8.6 10*3/uL (ref 4.0–10.5)
nRBC: 0 % (ref 0.0–0.2)

## 2022-09-18 LAB — URINALYSIS, ROUTINE W REFLEX MICROSCOPIC
Bilirubin Urine: NEGATIVE
Glucose, UA: NEGATIVE mg/dL
Hgb urine dipstick: NEGATIVE
Ketones, ur: NEGATIVE mg/dL
Leukocytes,Ua: NEGATIVE
Nitrite: NEGATIVE
Protein, ur: NEGATIVE mg/dL
Specific Gravity, Urine: 1.012 (ref 1.005–1.030)
pH: 7 (ref 5.0–8.0)

## 2022-09-18 LAB — COMPREHENSIVE METABOLIC PANEL
ALT: 19 U/L (ref 0–44)
AST: 17 U/L (ref 15–41)
Albumin: 4.3 g/dL (ref 3.5–5.0)
Alkaline Phosphatase: 69 U/L (ref 38–126)
Anion gap: 7 (ref 5–15)
BUN: 24 mg/dL — ABNORMAL HIGH (ref 6–20)
CO2: 26 mmol/L (ref 22–32)
Calcium: 9.2 mg/dL (ref 8.9–10.3)
Chloride: 106 mmol/L (ref 98–111)
Creatinine, Ser: 1.25 mg/dL — ABNORMAL HIGH (ref 0.44–1.00)
GFR, Estimated: 59 mL/min — ABNORMAL LOW (ref >60)
Glucose, Bld: 133 mg/dL — ABNORMAL HIGH (ref 70–99)
Potassium: 3.8 mmol/L (ref 3.5–5.1)
Sodium: 139 mmol/L (ref 135–145)
Total Bilirubin: 0.5 mg/dL (ref 0.3–1.2)
Total Protein: 7.6 g/dL (ref 6.5–8.1)

## 2022-09-18 LAB — PREGNANCY, URINE: Preg Test, Ur: NEGATIVE

## 2022-09-18 LAB — RESP PANEL BY RT-PCR (RSV, FLU A&B, COVID)  RVPGX2
Influenza A by PCR: NEGATIVE
Influenza B by PCR: NEGATIVE
Resp Syncytial Virus by PCR: NEGATIVE
SARS Coronavirus 2 by RT PCR: NEGATIVE

## 2022-09-18 LAB — LIPASE, BLOOD: Lipase: 32 U/L (ref 11–51)

## 2022-09-18 MED ORDER — ONDANSETRON 4 MG PO TBDP
ORAL_TABLET | ORAL | Status: AC
  Filled 2022-09-18: qty 1

## 2022-09-18 MED ORDER — SODIUM CHLORIDE 0.9 % IV SOLN: Freq: Once | INTRAVENOUS | Status: AC

## 2022-09-18 MED ORDER — ONDANSETRON 4 MG PO TBDP
4.0000 mg | ORAL_TABLET | Freq: Once | ORAL | Status: AC
  Administered 2022-09-18: 4 mg via ORAL

## 2022-09-18 MED ORDER — SODIUM CHLORIDE 0.9 % IV BOLUS
1000.0000 mL | Freq: Once | INTRAVENOUS | Status: AC
  Administered 2022-09-18: 1000 mL via INTRAVENOUS

## 2022-09-18 MED ORDER — IOHEXOL 300 MG/ML  SOLN
100.0000 mL | Freq: Once | INTRAMUSCULAR | Status: AC | PRN
  Administered 2022-09-18: 100 mL via INTRAVENOUS

## 2022-09-18 NOTE — ED Notes (Signed)
Pt to CT scanner at this time 

## 2022-09-18 NOTE — Progress Notes (Signed)
Spiritual Care consult for patient to speak with chaplain, noting suicidal thoughts. A short visit today, printed off more song lyrics for patient.

## 2022-09-18 NOTE — ED Notes (Signed)
Pt up to door stating "my stomach is starting to hurt, every time I stand up or lay on my side it hurts. Also I'm feeling very dizzy when I stand up."

## 2022-09-18 NOTE — ED Notes (Signed)
vol/pending placement.. 

## 2022-09-18 NOTE — ED Notes (Signed)
Pt asking to call Guardian for any updates. This RN told pt I will call and ask. No response from guardian at this time. Will try again later.

## 2022-09-18 NOTE — ED Notes (Addendum)
Pt seen walking out of room, stating she threw up. Pt does feel warm to touch. Pt to be switched to a different room so EVS can clean.

## 2022-09-18 NOTE — ED Provider Notes (Addendum)
Called by the nurse to see patient because she is having abdominal pain which is diffuse and apparently achy in nature.  She is now vomiting.  I have ordered blood work CT and IV fluids.  Will probably have to move her here to the main side. No exam well-developed well-nourished female not febrile heart rate is now 110 blood pressure 110/76 respiration 16 O2 sat 97 temperature 82.9 Head normocephalic atraumatic Heart no audible murmurs Lungs not in respiratory distress Back possible some left mild CVA tenderness not exactly sure Abdomen diffusely tender to palpation bowel sounds are positive but somewhat decreased no focal tenderness I have ordered a CT abdomen CBC with differential met see and lipase pregnancy test urinalysis patient denies dysuria though.   Nena Polio, MD 09/18/22 1750 ----------------------------------------- 7:58 PM on 09/18/2022 ----------------------------------------- Labs have returned and are normal except for urine which is not back.  Will get this back.  To ensure that we get this in a timely fashion or give her second liter of fluid.  We are still waiting for the CT abdomen as well.   Nena Polio, MD 09/18/22 1959 At 1230 on 09/19/2022 patients sutdies are back. No apparent source of abd pain. Pt seems better. We will continue to follow her.   Nena Polio, MD 09/19/22 [phone]

## 2022-09-18 NOTE — ED Notes (Addendum)
Pt was in the dayroom waiting to be transferred to another room when she threw up in the dayroom. Pt was then assisted by this tech to the bathroom for showering. Pt tolerated shower well. Pt is unstable when getting up and down from chair. Pt did almost fall several times when changing positions but seems to tolerate walking well. Pt showered, bathed, a change of clothes provided where pt was redressed and is currently resting in new room. Pt instructed to call for assistance via call bell when help is needed.

## 2022-09-18 NOTE — ED Notes (Signed)
Pt was found sitting on floor after another pt alerted [place] to her feeling faint. Pt complaining of feeling dizzy. Vitals taken, Dr Cinda Quest made aware of pt status. Pt was helped to room by this RN and April, security.

## 2022-09-18 NOTE — ED Provider Notes (Signed)
Emergency Medicine Observation Re-evaluation Note  Crystal [name] is a 31 y.o. female, seen in the emergency department for behavioral/psychiatric complaint.  Sting comfortably sleeping without distress  Physical Exam  BP 99/64 (BP Location: Right Arm)   Pulse (!) 104   Temp 98.4 F (36.9 C) (Oral)   Resp 16   Ht '5\' 1"'$  (1.549 m)   Wt 73 kg   SpO2 96%   BMI 30.41 kg/m   Normal respiratory pattern.  No distress, calm ED Course / MDM   No recent lab work for review  Plan   Current plan is for patient to an appropriate living facility once available.   Delman Kitten, MD 09/18/22 1357

## 2022-09-18 NOTE — ED Notes (Signed)
Pt up and out of room to give this writer her vomit bag. Observed pt almost falling several times again once around corner of [place] [address] hallway. This tech to pt side. Pt instructed to sit down. Vomit bag taken from pt and discarded. Pt escorted back to room with standby assist.

## 2022-09-18 NOTE — ED Notes (Signed)
Pt hit call bell at this time c/o "shaking real bad". Appropriate staff informed and needs addressed at this time.
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
## 2022-09-19 NOTE — ED Notes (Addendum)
Pt requested more blankets.  Two blankets given.  Temperature taken at this time, 98.2 O. Pt denies any other needs and expressed relief from H/A.

## 2022-09-19 NOTE — ED Notes (Signed)
Breakfast tray given to pt 

## 2022-09-19 NOTE — ED Provider Notes (Signed)
Emergency Medicine Observation Re-evaluation Note  Crystal [name] is a 31 y.o. female, seen on rounds today.  Pt initially presented to the ED for complaints of placement.  Currently, the patient is resting comfortably in the dayroom and has no acute complaints other than her room being cold.  She is actively drinking water and states her nausea and abdominal pain have resolved.   Physical Exam  BP 107/73 (BP Location: Left Arm)   Pulse 99   Temp 98.4 F (36.9 C) (Oral)   Resp 15   Ht '5\' 1"'$  (1.549 m)   Wt 73 kg   SpO2 98%   BMI 30.41 kg/m  Physical Exam  Alert, comfortably appearing, ambulating without difficulty, normal respiratory effort.   ED Course / MDM   I have reviewed the labs performed to date as well as medications administered while in observation.  Since yesterday the patient's abdominal pain and nausea have resolved.  Workup including CT abd was negative for acute findings.   Plan  Current plan is for placement.     Arta Silence, MD 09/19/22 [phone]

## 2022-09-19 NOTE — ED Notes (Signed)
Shower supplies given to pt. Pt showering at this time 

## 2022-09-19 NOTE — ED Notes (Signed)
VOL / pending TOC placement 

## 2022-09-19 NOTE — ED Notes (Addendum)
Pt appears to be sleeping, observed even RR and unlabored, blanket on pt for warmth and comfort, lights off to room to help induce sleep, NAD noted, security officer in [place] outside pt's room, room has been checked and secured for safety measures, plan of care on going, no further concerns as of present, hourly rounding continues from medical staff.

## 2022-09-19 NOTE — ED Notes (Signed)
Pt back to BHU at this time via wheelchair with assistance of NT

## 2022-09-20 NOTE — ED Notes (Signed)
Breakfast tray given with beverage provided at this time.

## 2022-09-20 NOTE — ED Notes (Signed)
Pt received meal tray and beverage at this time.

## 2022-09-20 NOTE — ED Notes (Signed)
Patient is vol pending placent

## 2022-09-20 NOTE — ED Notes (Signed)
This tech obtained vitals signs.

## 2022-09-20 NOTE — ED Notes (Signed)
Pt asked to go the bathroom. Tech unlocked door and pt went to the bathroom. Pt asked for her tv to be turned on. Tech attempted with 4 different remotes with no success. Pt encouraged to sit in day room since the tv is working inn there. Pt said "okay"

## 2022-09-21 NOTE — ED Notes (Signed)
Pt to nurses station, states that she has been talking to herself in room and feels she has convinced herself to return to former group home after previously not wanting to. Pt has very strong body odor that is very noticeable at this time

## 2022-09-21 NOTE — ED Notes (Signed)
VOL / pending TOC placement 

## 2022-09-21 NOTE — ED Notes (Signed)
Breakfast given.  

## 2022-09-21 NOTE — ED Notes (Signed)
Patient received lunch and beverage, no signs of distress, she is eating in her room, nurse will continue to monitor.

## 2022-09-21 NOTE — ED Notes (Signed)
Pt given pm snack and drink

## 2022-09-21 NOTE — ED Provider Notes (Signed)
Emergency Medicine Observation Re-evaluation Note  Crystal [name] is a 32 y.o. female, seen on rounds today.  Pt initially presented to the ED for complaints of Placement Currently, the patient is calm, resting.  Physical Exam  BP 97/72 (BP Location: Right Arm)   Pulse (!) 108   Temp 99.3 F (37.4 C)   Resp 20   Ht '5\' 1"'$  (1.549 m)   Wt 73 kg   SpO2 98%   BMI 30.41 kg/m   ED Course / MDM  EKG:   I have reviewed the labs performed to date as well as medications administered while in observation.  Recent changes in the last 24 hours include none.  Plan  Current plan is for SW placement.    Duffy Bruce, MD 09/21/22 Crystal [name]

## 2022-09-21 NOTE — ED Notes (Signed)
Patient sitting in the dayroom, she is alert and oriented, no signs of distress, interacting with other Patients well, will continue to monitor.

## 2022-09-21 NOTE — ED Provider Notes (Signed)
Emergency Medicine Observation Re-evaluation Note  Crystal [name] is a 32 y.o. female, seen on rounds today.     Physical Exam  BP 110/75 (BP Location: Left Arm)   Pulse 77   Temp 98.4 F (36.9 C) (Oral)   Resp 20   Ht '5\' 1"'$  (1.549 m)   Wt 73 kg   SpO2 100%   BMI 30.41 kg/m  Physical Exam General: Patient is resting comfortably in bed Lungs: Patient is not in respiratory distress Psych: Patient is not combative  ED Course / MDM  EKG:     Plan  Current plan is for placement.    Nena Polio, MD 09/21/22 [phone]

## 2022-09-21 NOTE — ED Notes (Signed)
Snack given Shower taken

## 2022-09-21 NOTE — ED Notes (Signed)
Pt wanted me to put in her chart that she is feeling anxiety about having to possibly going back to the group home she came from.

## 2022-09-22 MED ORDER — ZIPRASIDONE MESYLATE 20 MG IM SOLR
20.0000 mg | Freq: Once | INTRAMUSCULAR | Status: AC
  Administered 2022-09-22: 20 mg via INTRAMUSCULAR

## 2022-09-22 NOTE — ED Notes (Signed)
VOL  PENDING  TOC  PLACEMENT

## 2022-09-22 NOTE — Progress Notes (Signed)
   09/22/22 1500  Clinical Encounter Type  Visited With Patient  Visit Type Follow-up  Referral From Nurse  Consult/Referral To Chaplain   Chaplain responded to nurse page. Chaplain provided compassionate presence and reflective listening as patient shared her feelings and concerns. Chaplain played a cup game with patient and patient expressed happy feelings and appreciation for Chaplain visit. Chaplain services are available for follow up as needed.

## 2022-09-22 NOTE — ED Notes (Signed)
Patient tore off trim around the wall. Patient redirected and piece was removed.

## 2022-09-22 NOTE — ED Provider Notes (Signed)
Emergency Medicine Observation Re-evaluation Note  Crystal [name] is a 32 y.o. female, seen on rounds today.  Pt initially presented to the ED for complaints of Placement Currently, the patient is sitting on chair, complains of abdominal pain.  Physical Exam  BP 95/77 (BP Location: Left Arm)   Pulse 99   Temp 97.9 F (36.6 C) (Oral)   Resp 18   Ht '5\' 1"'$  (1.549 m)   Wt 73 kg   SpO2 99%   BMI 30.41 kg/m  Physical Exam Constitutional: Resting comfortably. Eyes: Conjunctivae are normal. Head: Atraumatic. Nose: No congestion/rhinnorhea. Mouth/Throat: Mucous membranes are moist. Neck: Normal ROM Cardiovascular: No cyanosis noted. Respiratory: Normal respiratory effort. Gastrointestinal: Soft, nontender. Genitourinary: deferred Musculoskeletal: No lower extremity tenderness nor edema. Neurologic:  Normal speech and language. No gross focal neurologic deficits are appreciated. Skin:  Skin is warm, dry and intact. No rash noted.   ED Course / MDM  EKG:   I have reviewed the labs performed to date as well as medications administered while in observation.  Recent changes in the last 24 hours include none.  Plan  Current plan is for dispo per social work.  Patient complains of ongoing abdominal pain, had full workup for this 4 days ago that was unremarkable and abdominal exam is currently benign, do not feel additional workup indicated at this time.    Blake Divine, MD 09/22/22 [phone]

## 2022-09-22 NOTE — ED Notes (Signed)
Patient along with another patient threw cup of water on writer with the goal of going to jail.  Patient was escorted to room by security.  Patient refused PO medication stating "give me the shot and take me to jail". EDP Dr. Dene Gentry made aware. IM medication ordered.  Patient took IM medications with no issues.

## 2022-09-22 NOTE — ED Notes (Signed)
Patient complaining of anxiety.  Writer educated patient on relaxation of deep breathing. Patient states that made her feel better. Patient states she is just having a lot of anxiety with group home placement and the possibility of having to go back to old group home. Patient states a group home worker called her a bitch and when she told the owner the owner did not believe her.

## 2022-09-22 NOTE — ED Notes (Signed)
Patient is currently laying in bed.

## 2022-09-23 NOTE — ED Notes (Signed)
Dinner and beverage provided

## 2022-09-23 NOTE — ED Provider Notes (Signed)
Emergency Medicine Observation Re-evaluation Note  Crystal [name] is a 32 y.o. female, seen on rounds today.  Pt initially presented to the ED for complaints of Placement Currently, the patient is resting comfortably.  Physical Exam  BP 129/82 (BP Location: Left Arm)   Pulse 87   Temp 98.4 F (36.9 C) (Oral)   Resp 20   Ht '5\' 1"'$  (1.549 m)   Wt 73 kg   SpO2 99%   BMI 30.41 kg/m  Physical Exam General: No acute distress Cardiac: Well-perfused extremities Lungs: No respiratory distress Psych: Appropriate mood and affect  ED Course / MDM  EKG:   I have reviewed the labs performed to date as well as medications administered while in observation.  Recent changes in the last 24 hours include none.  Plan  Current plan is for psychiatric placement.    Naaman Plummer, MD 09/23/22 [phone]

## 2022-09-23 NOTE — ED Notes (Signed)
Patient up to restroom.

## 2022-09-23 NOTE — ED Notes (Signed)
Patient asked for cup of water. Writer took patient cup of water. Patient drank and writer threw cup into trash can.

## 2022-09-24 NOTE — ED Notes (Signed)
Lunch and beverage provided

## 2022-09-24 NOTE — ED Notes (Signed)
Breakfast given.  

## 2022-09-24 NOTE — ED Notes (Signed)
Snack and beverage provided  

## 2022-09-24 NOTE — ED Provider Notes (Signed)
Emergency Medicine Observation Re-evaluation Note  Crystal [name] is a 32 y.o. female, seen on rounds today.  Pt initially presented to the ED for complaints of Placement  Currently, the patient is no acute distress. Resting in bed no issues per bhu nurse   Physical Exam  Blood pressure 106/75, pulse 78, temperature 98 F (36.7 C), temperature source Oral, resp. rate 16, height '5\' 1"'$  (1.549 m), weight 73 kg, SpO2 97 %.  Physical Exam General: No apparent distress Pulm: Normal WOB Psych: resting     ED Course / MDM     I have reviewed the labs performed to date as well as medications administered while in observation.  Recent changes in the last 24 hours include none   Plan   Current plan is to continue to wait for SW Patient is not under full IVC at this time.   Vanessa Pamlico, MD 09/24/22 [phone]

## 2022-09-24 NOTE — Progress Notes (Signed)
Met with patient today and remained in open area intentionally. Beginning to create boundaries with patient, limiting time and redirection of conversations to self reflect rather than blame others for personal behavior. Amongst her peer's she shared in conversation as well as listened to and sang songs she chose. Ended visit with prayer.

## 2022-09-24 NOTE — TOC Progression Note (Signed)
Transition of Care [facility]) - Progression Note    Patient Details  Name: Crystal [name] MRN: [id number] Date of Birth: 05-22-1991  [facility]) CM/SW Contact  Shelbie Hutching, RN Phone Number: 09/24/2022, 2:04 PM  Clinical Narrative:    RNCM reached out and spoke with patient's Case Manager with [place] [place].  [name] reports that the plan is for patient to return to [facility] but they are waiting on the group home owner [name] to complete the paperwork for an enhanced rate so that they can get one on one care for patient.  Per [name] group home should have paperwork completed and submitted by Monday.         Expected Discharge Plan and Services                                               Social Determinants of Health (SDOH) Interventions SDOH Screenings   Alcohol Screen: Low Risk  (11/11/2020)  Depression (PHQ2-9): Low Risk  (03/17/2022)  Tobacco Use: High Risk (09/13/2022)    Readmission Risk Interventions     No data to display

## 2022-09-24 NOTE — ED Notes (Signed)
Dinner tray provided for pt

## 2022-09-24 NOTE — ED Notes (Signed)
This tech obtained vitals on pt in dayroom.

## 2022-09-24 NOTE — ED Notes (Signed)
VOl/Pending return to Group home after paperwork is completed

## 2022-09-24 NOTE — ED Notes (Signed)
Pt given shower supplies. Pt showered independently.

## 2022-09-25 NOTE — ED Notes (Signed)
Pt refused snack, but accepted ice water at this time.

## 2022-09-25 NOTE — ED Notes (Signed)
Pt denies SI/HI/AVH on assessment. Pt reports anxiety about her placement, stating she thought she was going back to the [facility] she came from and should be gone already but the SW has not updated her on what is going on. Advised her to be patient as  the SW worked on her placement.

## 2022-09-25 NOTE — ED Notes (Signed)
vol/pending return to [facility].Marland Kitchen

## 2022-09-25 NOTE — ED Notes (Signed)
VOL  PENDING  TOC  PLACEMENT

## 2022-09-25 NOTE — ED Notes (Signed)
Pt given dinner tray and water at this time.

## 2022-09-26 NOTE — ED Notes (Signed)
Had visit with chaplain in [place]

## 2022-09-26 NOTE — ED Notes (Signed)
VOL/pending placement 

## 2022-09-26 NOTE — Progress Notes (Signed)
Crystal [name] requested a visit following awaking with a nightmare related to her mother. Crystal [name] shared about her fear of not going back to her previous placement and fear of her mothers death. Chaplain validated dream and normalized thinking. Crystal [name] reports her birthday is tomorrow. Chaplain engaged in a time of meaning making inviting everyone in the room to name Crystal [name]'s goodness. Chaplain offered her prayer. She requested song lycris and coloring pages. Staff report her privileges are revoked.     09/26/22 1200  Spiritual Encounters  Type of Visit Follow up  Care provided to: Patient  Reason for visit Routine spiritual support  Spiritual Needs Emotional;Prayer  Spiritual Framework  Presenting Themes Coping tools;Courage hope and growth  Community/Connection Limited  Interventions  Spiritual Care Interventions Made Compassionate presence;Prayer;Normalization of emotions;Reflective listening

## 2022-09-26 NOTE — ED Notes (Signed)
Pt added additional requests of asking legal guardian to come visit her and asking her to coordinate phone call w/Pastor Charlton Haws.

## 2022-09-26 NOTE — ED Notes (Signed)
Crystal [name] (Chaplain) notified of pt request for visit.  Per [name], she will come approx 0730 this morning.

## 2022-09-26 NOTE — ED Notes (Signed)
Pt came to door tearful.  Upon assessment, pt stated that she had a nightmare about her mother dying.  States that this is the 4th night in a row that this has happened.  Pt unable to state how long it has been since she spoke to her mom but stated that her guardian has to be the one to get in touch with her. Stated that she does not know anything about her mother's well being and has been thinking about it a lot lately.  This RN provided emotional support and let pt know that guardian will be contacted with request. Med review to be requested.  Visit with chaplain offered as well and pt accepted offer.  Cup of water given.  Pt denies any other needs at this time.

## 2022-09-26 NOTE — ED Notes (Signed)
VOL / pending TOC placement 

## 2022-09-26 NOTE — ED Provider Notes (Signed)
Emergency Medicine Observation Re-evaluation Note  Crystal [name] is a 32 y.o. female, seen on rounds today.  Pt initially presented to the ED for complaints of Placement  Currently, the patient is is no acute distress. Denies any concerns at this time.  Physical Exam  Blood pressure 107/80, pulse 70, temperature 99.3 F (37.4 C), resp. rate 18, height '5\' 1"'$  (1.549 m), weight 73 kg, SpO2 100 %.  Physical Exam: General: No apparent distress Pulm: Normal WOB Neuro: Moving all extremities Psych: Resting comfortably     ED Course / MDM     I have reviewed the labs performed to date as well as medications administered while in observation.  Recent changes in the last 24 hours include: No acute events overnight.  Plan   Current plan: Patient awaiting psychiatric disposition. Patient is not under full IVC at this time.    Nathaniel Man, MD 09/26/22 [phone]

## 2022-09-26 NOTE — ED Notes (Signed)
Pt requesting RN at this time.

## 2022-09-26 NOTE — ED Notes (Signed)
Pt given nighttime snack. 

## 2022-09-27 NOTE — ED Notes (Signed)
Snack and drink given

## 2022-09-27 NOTE — ED Notes (Signed)
Pt given breakfast tray and beverage.  

## 2022-09-27 NOTE — ED Notes (Signed)
Dinner tray and beverage given to pt

## 2022-09-27 NOTE — ED Provider Notes (Signed)
Emergency Medicine Observation Re-evaluation Note  Crystal [name] is a 32 y.o. female, seen on rounds today.  Pt initially presented to the ED for complaints of Placement   Physical Exam  BP 101/79 (BP Location: Right Arm)   Pulse 82   Temp 97.9 F (36.6 C) (Oral)   Resp 17   Ht '5\' 1"'$  (1.549 m)   Wt 73 kg   SpO2 97%   BMI 30.41 kg/m  Physical Exam General: NAD  ED Course / MDM  EKG:   I have reviewed the labs performed to date as well as medications administered while in observation.  Recent changes in the last 24 hours include.  Plan  Current plan is for Psych dispo    Merlyn Lot, MD 09/27/22 [phone]

## 2022-09-28 NOTE — ED Provider Notes (Signed)
Emergency Medicine Observation Re-evaluation Note  Crystal [name] is a 32 y.o. female, seen on rounds today.  Pt initially presented to the ED for complaints of Placement   Physical Exam  BP 95/68   Pulse 100   Temp 98.2 F (36.8 C)   Resp 16   Ht '5\' 1"'$  (1.549 m)   Wt 73 kg   SpO2 95%   BMI 30.41 kg/m  Physical Exam General: no distress  Lungs: no increased wob Psych: calm, no agitation   ED Course / MDM  EKG:   I have reviewed the labs performed to date as well as medications administered while in observation.  Recent changes in the last 24 hours include none.   Plan  Current plan is for SW dispo.     Rada Hay, MD 09/28/22 [phone]

## 2022-09-28 NOTE — ED Notes (Signed)
Pt taking shower. Pt was given hygiene items and the following, 1 clean top, 1 clean bottom, with 1 pair of disposable underwear.  Pt changed out into clean clothing.  Staff disposed of all shower supplies.   

## 2022-09-28 NOTE — TOC Progression Note (Signed)
Transition of Care [facility]) - Progression Note    Patient Details  Name: Kortlynn Poust MRN: [id number] Date of Birth: March 25, 1991  [facility] CM/SW Contact  Shelbie Hutching, RN Phone Number: 09/28/2022, 3:16 PM  Clinical Narrative:    Patient's guardian came to visit with patient today.  Joventerice, guardian, reports that the plan is for patient to still return to [facility], just waiting on some paperwork Mrs Ma Rings needs to complete.         Expected Discharge Plan and Services                                               Social Determinants of Health (SDOH) Interventions SDOH Screenings   Alcohol Screen: Low Risk  (11/11/2020)  Depression (PHQ2-9): Low Risk  (03/17/2022)  Tobacco Use: High Risk (09/13/2022)    Readmission Risk Interventions     No data to display

## 2022-09-28 NOTE — ED Notes (Signed)
VOl pending TOC placement  recent note on chart

## 2022-09-28 NOTE — ED Notes (Signed)
Staff gave patient some water.

## 2022-09-29 NOTE — ED Notes (Signed)
VOL  TOC  PLACEMENT 

## 2022-09-29 NOTE — ED Notes (Signed)
Dinner tray provided for pt

## 2022-09-29 NOTE — ED Provider Notes (Signed)
Emergency Medicine Observation Re-evaluation Note  Crystal [name] is a 32 y.o. female, seen on rounds today.  Pt initially presented to the ED for complaints of Placement Currently, the patient is resting in the day room.  Physical Exam  BP 110/80 (BP Location: Right Arm)   Pulse 88   Temp 97.7 F (36.5 C) (Oral)   Resp 18   Ht '5\' 1"'$  (1.549 m)   Wt 73 kg   SpO2 100%   BMI 30.41 kg/m  Physical Exam General: Comfortable appearing, no distress Lungs: Normal respiratory effort Psych: Calm and cooperative  ED Course / MDM   I have reviewed the labs performed to date as well as medications administered while in observation.  There have been no significant changes in the last 24 hours.  Plan  Current plan is for social work disposition.    Arta Silence, MD 09/29/22 2004

## 2022-09-29 NOTE — ED Notes (Signed)
Vol  TOC  placement

## 2022-09-29 NOTE — ED Notes (Signed)
VOL/Pending TOC Placement 

## 2022-09-29 NOTE — Progress Notes (Signed)
  While visiting another patient in the ED-BHU, this Chaplain On-Call was asked by [facility] for time to visit.  Chaplain offered listening presence as Crystal [name] discussed her ongoing frustration with the unresolved Group Home situation. Chaplain provided spiritual and emotional support and prayer.  Crystal [name] also requested printed lyrics of several spiritual songs. Chaplain will attempt to provide these for her.  Chaplain Pollyann Samples M.Div., [facility]

## 2022-09-30 MED ORDER — ONDANSETRON 4 MG PO TBDP
4.0000 mg | ORAL_TABLET | Freq: Once | ORAL | Status: AC
  Administered 2022-09-30: 4 mg via ORAL
  Filled 2022-09-30: qty 1

## 2022-09-30 NOTE — ED Provider Notes (Signed)
Emergency Medicine Observation Re-evaluation Note  Physical Exam   BP 112/68   Pulse 80   Temp 98.8 F (37.1 C) (Oral)   Resp 16   Ht '5\' 1"'$  (1.549 m)   Wt 73 kg   SpO2 98%   BMI 30.41 kg/m   Pt is calm and resting, respirations unlabored, and appears comfortable.   ED Course / MDM   No reported events during my shift at the time of this note.   Pt is awaiting dispo from SW   Lucillie Garfinkel MD    Lucillie Garfinkel, MD 09/30/22 [phone]

## 2022-09-30 NOTE — ED Notes (Signed)
Patient alert and oriented, denies Hi/si or avh, no signs of distress, Patient laughs loudly for no reason, but she is without any negative behavioral issues, will continue to monitor, she took all po medications that are ordered per morning.

## 2022-09-30 NOTE — ED Notes (Signed)
Patient received snack, no signs of distress.

## 2022-09-30 NOTE — ED Notes (Signed)
Offered Shower patient denied

## 2022-09-30 NOTE — ED Notes (Signed)
Breakfast given.  

## 2022-09-30 NOTE — ED Notes (Signed)
Pt provided night snack and water

## 2022-09-30 NOTE — TOC Progression Note (Signed)
Transition of Care [facility]) - Progression Note    Patient Details  Name: Crystal [name] MRN: [id number] Date of Birth: 09/22/1990  [facility]) CM/SW Contact  Shelbie Hutching, RN Phone Number: 09/30/2022, 3:16 PM  Clinical Narrative:    Teams meeting completed today with [facility] CM [name] and guardian and Group home owner, Mrs Timons.  Apparently Mrs.Timons is still working on getting one more signature from the patient's psychiatrist for the paperwork to turn in, hopefully all paperwork will be submitted by Friday.  If it is not completed by Friday [facility] will start looking for alternative placement.          Expected Discharge Plan and Services                                               Social Determinants of Health (SDOH) Interventions SDOH Screenings   Alcohol Screen: Low Risk  (11/11/2020)  Depression (PHQ2-9): Low Risk  (03/17/2022)  Tobacco Use: High Risk (09/13/2022)    Readmission Risk Interventions     No data to display

## 2022-09-30 NOTE — ED Notes (Signed)
Patient consumed lunch and beverage, no signs of distress, she is without any behavioral issues, pleasant and calm. Nurse will continue to monitor.

## 2022-09-30 NOTE — ED Notes (Signed)
Pt to desk after finishing snack and requests to speak to the doctor as she feels nauseated and would like medication for it. Secure chat sent to Dr. Jacelyn Grip with request.

## 2022-09-30 NOTE — ED Notes (Signed)
Lunch given.

## 2022-10-01 NOTE — Progress Notes (Signed)
   10/01/22 2000  Spiritual Encounters  Type of Visit Follow up  Care provided to: Patient  Referral source Patient request  Reason for visit Routine spiritual support  Spiritual Needs Emotional   Patient requested conversation to discuss life events.

## 2022-10-01 NOTE — ED Provider Notes (Signed)
Emergency Medicine Observation Re-evaluation Note  Crystal [name] is a 32 y.o. female, seen on rounds today.  Pt initially presented to the ED for complaints of Placement Currently, the patient is laying in bed, complains of regular headache after eating.  Physical Exam  BP 94/77 (BP Location: Left Arm)   Pulse 91   Temp 98.3 F (36.8 C) (Oral)   Resp 18   Ht '5\' 1"'$  (1.549 m)   Wt 73 kg   SpO2 97%   BMI 30.41 kg/m  Physical Exam Constitutional: Resting comfortably. Eyes: Conjunctivae are normal. Head: Atraumatic. Nose: No congestion/rhinnorhea. Mouth/Throat: Mucous membranes are moist. Neck: Normal ROM Cardiovascular: No cyanosis noted. Respiratory: Normal respiratory effort. Gastrointestinal: Non-distended. Genitourinary: deferred Musculoskeletal: No lower extremity tenderness nor edema. Neurologic:  Normal speech and language. No gross focal neurologic deficits are appreciated. Skin:  Skin is warm, dry and intact. No rash noted.   ED Course / MDM  EKG:   I have reviewed the labs performed to date as well as medications administered while in observation.  Recent changes in the last 24 hours include none.  Plan  Current plan is for dispo per social work. No concerning features of headache, will treat with tylenol.    Blake Divine, MD 10/01/22 0930

## 2022-10-01 NOTE — ED Notes (Signed)
VOL  TOC  PLACEMENT 

## 2022-10-01 NOTE — ED Notes (Signed)
Chaplin to see patient.

## 2022-10-01 NOTE — ED Notes (Signed)
Report given to Julianne Rice RN

## 2022-10-01 NOTE — ED Notes (Signed)
This tech obtained vitals on pt.

## 2022-10-02 NOTE — ED Provider Notes (Signed)
Emergency Medicine Observation Re-evaluation Note  Crystal [name] is a 32 y.o. female, seen on rounds today.  Pt initially presented to the ED for complaints of Placement Currently, the patient is in bed  Physical Exam  BP 101/89 (BP Location: Left Arm)   Pulse 71   Temp 98.6 F (37 C) (Oral)   Resp 20   Ht 1.549 m ('5\' 1"'$ )   Wt 73 kg   SpO2 97%   BMI 30.41 kg/m  Physical Exam General: No issues overnight  Lungs: No increased work of breathing Psych: Calm at this time  ED Course / MDM   TOC is attempting placement   Plan  Current plan is for [facility] placement    Lavonia Drafts, MD 10/02/22 [phone]

## 2022-10-02 NOTE — ED Notes (Signed)
Chaplain here visiting with the patients in the dayroom

## 2022-10-02 NOTE — ED Notes (Signed)
VOL TOC placement 

## 2022-10-02 NOTE — Progress Notes (Signed)
   10/02/22 1600  Spiritual Encounters  Type of Visit Initial  Care provided to: Patient  Referral source Chaplain team  Reason for visit Routine spiritual support  OnCall Visit Yes  Spiritual Needs Prayer   Chaplain visited with patient. Chaplain provided compassionate presence and reflective listening as patient spoke about emotional/spiritual challenges. New Chaplain was being trained and present. Chaplain provided prayer as requested. Chaplain services are available for follow up as needed.

## 2022-10-02 NOTE — ED Notes (Signed)
Snack was provided with water. No other needs at this time

## 2022-10-02 NOTE — ED Notes (Signed)
Pt asking to shower. Pt given shower supplies by [place], EDT.

## 2022-10-02 NOTE — ED Notes (Signed)
Pt in bed resting at this time.

## 2022-10-02 NOTE — ED Notes (Signed)
Hospital meal provided, pt tolerated w/o complaints.  Waste discarded appropriately.  

## 2022-10-02 NOTE — ED Notes (Signed)
Pt awake asking for water. EDT and Security office on the unit obtaining vital signs and giving patient water.

## 2022-10-02 NOTE — ED Notes (Signed)
Pt offered new bedsheets, pt declines states that she just changed her sheets and does not need a new on at this time. Pt given meal tray by [place] EDT. Pt is calm and cooperative at this time.

## 2022-10-03 NOTE — ED Provider Notes (Signed)
Emergency Medicine Observation Re-evaluation Note  Crystal [name] is a 32 y.o. female, seen on rounds today.  Pt initially presented to the ED for complaints of Placement Currently, the patient is awaiting placement.  Physical Exam  BP 100/69 (BP Location: Left Arm)   Pulse 88   Temp 98.2 F (36.8 C) (Oral)   Resp 18   Ht '5\' 1"'$  (1.549 m)   Wt 73 kg   SpO2 97%   BMI 30.41 kg/m  Physical Exam General: calm   ED Course / MDM  EKG:   I have reviewed the labs performed to date as well as medications administered while in observation.  Recent changes in the last 24 hours include none.  Plan  Current plan is for placement.    Nance Pear, MD 10/03/22 1200

## 2022-10-03 NOTE — ED Notes (Signed)
VOL / pending TOC placement 

## 2022-10-03 NOTE — ED Notes (Signed)
Pt denies SI/HI/AVH on assessment. Pt states her guardian told her she will be returning to her previous group home with additional staff. Pt excited of possible discharge soon.

## 2022-10-03 NOTE — ED Notes (Signed)
Patient given snack.  

## 2022-10-03 NOTE — ED Notes (Signed)
Provided snack: vanilla icecream, graham crackers, PB packets, pretzels and juice.

## 2022-10-04 MED ORDER — ONDANSETRON 4 MG PO TBDP
4.0000 mg | ORAL_TABLET | Freq: Once | ORAL | Status: AC
  Administered 2022-10-04: 4 mg via ORAL
  Filled 2022-10-04: qty 1

## 2022-10-04 NOTE — ED Notes (Signed)
Pt denies SI/HI/AVH on assessment

## 2022-10-04 NOTE — ED Provider Notes (Signed)
Emergency Medicine Observation Re-evaluation Note  Crystal [name] is a 32 y.o. female, seen on rounds today.  Pt initially presented to the ED for complaints of Placement Currently, the patient is resting in her room.  Physical Exam  BP 106/74 (BP Location: Left Arm)   Pulse 99   Temp 97.8 F (36.6 C) (Oral)   Resp 16   Ht '5\' 1"'$  (1.549 m)   Wt 73 kg   SpO2 97%   BMI 30.41 kg/m  Physical Exam General: No distress Lungs: Normal respiratory effort Psych: Calm and cooperative  ED Course / MDM   Recent changes in the last 24 hours include: none.  Plan  Current plan is for social work disposition.    Arta Silence, MD 10/04/22 1538

## 2022-10-04 NOTE — ED Notes (Signed)
Patient is vol pending placement 

## 2022-10-04 NOTE — ED Notes (Signed)
pt recieved snack and drink 

## 2022-10-04 NOTE — ED Notes (Signed)
vol/pending toc placement.. 

## 2022-10-05 NOTE — ED Notes (Signed)
Pt denies SI/HI/AVH on assessment

## 2022-10-05 NOTE — ED Notes (Signed)
Snack given.

## 2022-10-05 NOTE — ED Notes (Signed)
VOL  TOC  PLACEMENT 

## 2022-10-05 NOTE — ED Provider Notes (Signed)
Emergency Medicine Observation Re-evaluation Note  Crystal [name] is a 32 y.o. female, seen on rounds today.  Pt initially presented to the ED for complaints of Placement  Patient observed via video camera to be sleeping.  Nursing, Dorothy, and tech staff in the behavioral unit advising everyone is currently sleeping, and recommended not attempt to awaken them at this time to allow a full rest. Evidently some were up quite late on the unit.  They will notify to me if any concerns  Physical Exam  BP 105/74   Pulse 80   Temp 98 F (36.7 C) (Oral)   Resp 17   Ht '5\' 1"'$  (1.549 m)   Wt 73 kg   SpO2 100%   BMI 30.41 kg/m  Physical Exam General: No distress Lungs: Normal respiratory effort Psych: calm, asleep  ED Course / MDM   Recent changes in the last 24 hours include: none.  Plan  Current plan is for social work disposition.      Delman Kitten, MD 10/05/22 [phone]

## 2022-10-05 NOTE — TOC Progression Note (Signed)
Transition of Care [facility]) - Progression Note    Patient Details  Name: Crystal [name] MRN: [id number] Date of Birth: Oct 09, 1990  Transition of Care [facility]) CM/SW Contact  Shelbie Hutching, RN Phone Number: 10/05/2022, 3:50 PM  Clinical Narrative:     No updates today from patient's guardian or [facility] Case Manager        Expected Discharge Plan and Services                                               Social Determinants of Health (SDOH) Interventions SDOH Screenings   Alcohol Screen: Low Risk  (11/11/2020)  Depression (PHQ2-9): Low Risk  (03/17/2022)  Tobacco Use: High Risk (09/13/2022)    Readmission Risk Interventions     No data to display

## 2022-10-05 NOTE — ED Notes (Signed)
Pt given dinner tray and beverage  

## 2022-10-06 NOTE — ED Notes (Signed)
This tech obtained pt vitals.

## 2022-10-06 NOTE — ED Provider Notes (Signed)
Emergency Medicine Observation Re-evaluation Note  Crystal [name] is a 32 y.o. female, seen on rounds today.  Pt initially presented to the ED for complaints of Placement  Sitting up in the day room.  No distress.  Currently getting her vitals checked by staff.  Physical Exam  BP 98/70 (BP Location: Right Arm)   Pulse 75   Temp 98 F (36.7 C) (Oral)   Resp 16   Ht '5\' 1"'$  (1.549 m)   Wt 73 kg   SpO2 98%   BMI 30.41 kg/m  Physical Exam General: No distress Lungs: Normal respiratory effort Psych: Calm, conversant and in no distress sitting up in the day room.  ED Course / MDM   Recent changes in the last 24 hours include: none.  Plan  Current plan is for social work disposition.  Social work advised yesterday that they did not have any further update yet from guardian or potential group home    Delman Kitten, MD 10/06/22 [phone]

## 2022-10-06 NOTE — ED Notes (Signed)
VOL/Still Pending TOC Placement

## 2022-10-06 NOTE — ED Notes (Signed)
Patient asking repeatedly to speak with social worker about her possible discharge. Patient told writer she is supposed to leave this week. Patient states she is ready to leave this hospital. Patient irritable at this time.

## 2022-10-07 NOTE — ED Notes (Signed)
Patient ate 100% of lunch, no signs of distress, will continue to monitor, Patient states that she was supposed to go to a group home this week according to her guardian and she wants to find out if and when this will happen, I let her know that I was text the social worker here.

## 2022-10-07 NOTE — ED Notes (Signed)
Patient ate 100% of supper and beverage.

## 2022-10-07 NOTE — ED Notes (Signed)
Patient is calm and cooperative, complained of headache and nurse administered motrin po, Patient took all morning meds without difficulty.

## 2022-10-07 NOTE — ED Notes (Signed)
This tech obtained vitals on pt.

## 2022-10-07 NOTE — ED Notes (Signed)
vol/pending toc placement.. 

## 2022-10-07 NOTE — TOC Progression Note (Signed)
Transition of Care [facility]) - Progression Note    Patient Details  Name: Crystal [name] MRN: [id number] Date of Birth: 1990/12/14  Transition of Care [facility]) CM/SW Contact  Shelbie Hutching, RN Phone Number: 10/07/2022, 3:04 PM  Clinical Narrative:    RNCM called and left a message for Stacy at [facility].  Received a call from patient's guardian, [name], she reports that [name] from [facility] did get all her paperwork completed and turned in.  We still do not have a date of when patient will be able to move back in to the home.         Expected Discharge Plan and Services                                               Social Determinants of Health (SDOH) Interventions SDOH Screenings   Alcohol Screen: Low Risk  (11/11/2020)  Depression (PHQ2-9): Low Risk  (03/17/2022)  Tobacco Use: High Risk (09/13/2022)    Readmission Risk Interventions     No data to display

## 2022-10-07 NOTE — ED Provider Notes (Signed)
Emergency Medicine Observation Re-evaluation Note  Crystal [name] is a 32 y.o. female, seen on rounds today.  Pt initially presented to the ED for complaints of Placement   Physical Exam  BP 92/74 (BP Location: Left Arm)   Pulse 83   Temp 97.7 F (36.5 C) (Oral)   Resp 18   Ht '5\' 1"'$  (1.549 m)   Wt 73 kg   SpO2 98%   BMI 30.41 kg/m  Physical Exam General: sleeping Lungs: no distress Psych: no agitation  ED Course / MDM  EKG:   I have reviewed the labs performed to date as well as medications administered while in observation.  Recent changes in the last 24 hours include none.  Plan  Current plan is for SW disposition.    Rada Hay, MD 10/07/22 1515

## 2022-10-08 NOTE — ED Provider Notes (Signed)
Emergency Medicine Observation Re-evaluation Note  Physical Exam   BP 109/78 (BP Location: Right Arm)   Pulse 73   Temp 97.6 F (36.4 C) (Oral)   Resp 20   Ht '5\' 1"'$  (1.549 m)   Wt 73 kg   SpO2 98%   BMI 30.41 kg/m   Pt is calm and resting, respirations unlabored, and appears comfortable.   ED Course / MDM   No reported events during my shift at the time of this note.   Pt is awaiting dispo from [place] MD    Lucillie Garfinkel, MD 10/08/22 [phone]

## 2022-10-08 NOTE — ED Notes (Signed)
Yogurt, graham crackers, ice cream, and water provided for snack.

## 2022-10-08 NOTE — ED Notes (Signed)
Dinner tray given to pt

## 2022-10-09 NOTE — ED Notes (Signed)
Pt taking shower. Pt was given hygiene items and the following, 1 clean top, 1 clean bottom, with 1 pair of disposable underwear.  Pt changed out into clean clothing.  Staff disposed of all shower supplies.   

## 2022-10-09 NOTE — ED Notes (Signed)
VOL/Pending return date to group Home not known yet

## 2022-10-09 NOTE — ED Notes (Signed)
Hospital meal provided, pt tolerated w/o complaints.  Waste discarded appropriately.  

## 2022-10-09 NOTE — Progress Notes (Signed)
   10/09/22 1640  Spiritual Encounters  Type of Visit Initial  Care provided to: Patient  Conversation partners present during encounter  [facility])  Reason for visit Routine spiritual support  OnCall Visit No  Spiritual Needs Emotional  Spiritual Framework  Patient Stress Factors Exhausted  Interventions  Spiritual Care Interventions Made Reflective listening  Intervention Outcomes  Outcomes Reduced isolation  Spiritual Care Plan  Spiritual Care Issues Still Outstanding No further spiritual care needs at this time (see row info)   Chaplain visited with the patient. Crystal [name] expressed frustration due to what she called "inconsistent information about when she will be able to go back home" from the nurses. Chaplain encouraged patient to be patient with the staff as they work things out as well as demonstrated to the patient care and concern about her needs.

## 2022-10-09 NOTE — ED Notes (Signed)
Meal tray given 

## 2022-10-09 NOTE — Progress Notes (Signed)
   10/09/22 1600  Spiritual Encounters  Type of Visit Initial  Care provided to: Patient  Conversation partners present during encounter  Counsellor)  Referral source Nurse (RN/NT/LPN)  Reason for visit Routine spiritual support  OnCall Visit Yes  Spiritual Needs Emotional   Chaplain responded to nurse page for routine spiritual/emotional support. Chaplain provided compassionate presence and reflective listening as patient spoke about frustrations and challenges. Chaplain services are available for follow up as needed.

## 2022-10-09 NOTE — ED Notes (Signed)
Chaplain visiting with pt.

## 2022-10-09 NOTE — ED Notes (Signed)
Snack given.

## 2022-10-09 NOTE — ED Notes (Signed)
Pt given nighttime snack. 

## 2022-10-09 NOTE — ED Provider Notes (Signed)
Emergency Medicine Observation Re-evaluation Note  Crystal [name] is a 32 y.o. female, seen on rounds today.  Pt initially presented to the ED for complaints of Placement   Physical Exam  BP 111/74 (BP Location: Right Arm)   Pulse 83   Temp 98.8 F (37.1 C) (Oral)   Resp 18   Ht '5\' 1"'$  (1.549 m)   Wt 73 kg   SpO2 99%   BMI 30.41 kg/m  Physical Exam General: NAd ED Course / MDM  EKG:   I have reviewed the labs performed to date as well as medications administered while in observation.  Recent changes in the last 24 hours include .  Plan  Current plan is for SW.    Merlyn Lot, MD 10/09/22 [phone]

## 2022-10-10 NOTE — ED Notes (Signed)
Per [facility], in person visit available in approx 30 minutes.

## 2022-10-10 NOTE — ED Notes (Signed)
Chaplain paged per pt request.

## 2022-10-10 NOTE — ED Notes (Signed)
Chaplain visiting with pt in day room at this time.

## 2022-10-10 NOTE — ED Notes (Signed)
NT [facility] gave pt night time snack.

## 2022-10-10 NOTE — ED Provider Notes (Signed)
Emergency Medicine Observation Re-evaluation Note  Crystal [name] is a 32 y.o. female, seen on rounds today.  Pt initially presented to the ED for complaints of Placement Currently, the patient is resting comfortably.  Physical Exam  BP 111/78 (BP Location: Right Arm)   Pulse 78   Temp 97.8 F (36.6 C) (Oral)   Resp 18   Ht '5\' 1"'$  (1.549 m)   Wt 73 kg   SpO2 100%   BMI 30.41 kg/m  Physical Exam General: No acute distress Cardiac: Well-perfused extremities Lungs: No respiratory distress Psych: Appropriate mood and affect  ED Course / MDM  EKG:   I have reviewed the labs performed to date as well as medications administered while in observation.  Recent changes in the last 24 hours include none.  Plan  Current plan is for placement.    Naaman Plummer, MD 10/10/22 [phone]

## 2022-10-10 NOTE — Progress Notes (Addendum)
Chaplain returned following earlier visit (1801), this time following a tense verbal interaction with another pt. Chaplain offered calming presence, reviewed self-care strategies and tools to manage conflict. Ended with prayer.   Please call as needed for ongoing support.   Minus [place], [place] Pager:  [phone]    10/10/22 2100  Spiritual Encounters  Type of Visit Follow up  Care provided to: Patient  Conversation partners present during encounter  (Pt Crystal [name] Resides in community room)  Referral source Patient request  Reason for visit Urgent spiritual support  OnCall Visit Yes  Spiritual Needs Emotional;Prayer  Spiritual Framework  Presenting Themes Coping tools  Patient Stress Factors Other (Comment) (relationships)  Interventions  Spiritual Care Interventions Made Established relationship of care and support;Reflective listening;Prayer;Self-care teaching  Intervention Outcomes  Outcomes Awareness of support;Reduced anxiety

## 2022-10-10 NOTE — ED Notes (Signed)
VOL / pending TOC placement 

## 2022-10-10 NOTE — ED Notes (Signed)
Offered shower, pt refused says she'll take it later because she has a headache now, given prn med

## 2022-10-11 NOTE — ED Notes (Signed)
This tech obtained vitals on pt.

## 2022-10-11 NOTE — ED Notes (Signed)
Pt requested to speak with this RN privately in her room. Pt stated that she is upset because Corinne Ports is not happy for her and Zenia Resides. Pt provided with emotional support by this RN. Pt requested Chaplain visit.

## 2022-10-11 NOTE — ED Notes (Signed)
Patient is vol pending placement 

## 2022-10-11 NOTE — ED Notes (Signed)
Pt given snack and drink 

## 2022-10-12 NOTE — ED Provider Notes (Signed)
Emergency Medicine Observation Re-evaluation Note  Crystal [name] is a 33 y.o. female, seen on rounds today.  Pt initially presented to the ED for complaints of Placement  Currently, the patient is is no acute distress. Denies any concerns at this time.  Physical Exam  Blood pressure (!) 161/84, pulse 94, temperature 97.9 F (36.6 C), temperature source Oral, resp. rate 18, height '5\' 1"'$  (1.549 m), weight 73 kg, SpO2 96 %.  Physical Exam: General: No apparent distress Pulm: Normal WOB Neuro: Moving all extremities Psych: Resting comfortably     ED Course / MDM     I have reviewed the labs performed to date as well as medications administered while in observation.  Recent changes in the last 24 hours include: No acute events overnight.  Plan   Current plan: Social work disposition   Nathaniel Man, MD 10/12/22 [phone]

## 2022-10-12 NOTE — ED Notes (Signed)
Vol / pending TOC placement 

## 2022-10-12 NOTE — Progress Notes (Signed)
   10/12/22 1130  Spiritual Encounters  Type of Visit Follow up  Care provided to: Patient  Referral source Chaplain assessment  Reason for visit Routine spiritual support  OnCall Visit No  Spiritual Framework  Patient Stress Factors Family relationships  Interventions  Spiritual Care Interventions Made Reconciliation with self/others  Intervention Outcomes  Outcomes Reduced anxiety  Spiritual Care Plan  Spiritual Care Issues Still Outstanding No further spiritual care needs at this time (see row info)   Crystal [name] was feeling frustrated due to a friend who was taken ill as well as lack of clear path to her  going home. Chaplain reflective listening helped her reconciliation with self and bring down her anxiety levels.

## 2022-10-12 NOTE — ED Notes (Signed)
vol/pending toc placement.. 

## 2022-10-13 NOTE — ED Provider Notes (Signed)
Emergency Medicine Observation Re-evaluation Note  Jerlean Peralta is a 32 y.o. female, seen on rounds today.  Pt initially presented to the ED for complaints of Placement  Patient currently sleeping  Physical Exam  Blood pressure (!) 161/84, pulse 94, temperature 97.9 F (36.6 C), temperature source Oral, resp. rate 18, height '5\' 1"'$  (1.549 m), weight 73 kg, SpO2 96 %.  Physical Exam: General: No apparent distress, sleeping Pulm: Normal WOB Psych: Resting comfortably     ED Course / MDM     I have reviewed the labs performed to date as well as medications administered while in observation.  Recent changes in the last 24 hours include: No acute events overnight.  Plan   Current plan: Social work disposition     Delman Kitten, MD 10/13/22 [phone]

## 2022-10-13 NOTE — ED Notes (Signed)
Breakfast and cranberry juice provided

## 2022-10-13 NOTE — ED Notes (Addendum)
Pt care taken, pt laying in bed, just went back to room after using the bathroom. No complaints at this time.

## 2022-10-13 NOTE — ED Notes (Signed)
Pt given snack and drink 

## 2022-10-13 NOTE — ED Notes (Signed)
This tech obtain pt vital signs.

## 2022-10-13 NOTE — ED Notes (Signed)
Dinner tray given to pt

## 2022-10-13 NOTE — ED Notes (Signed)
Pt given snack. 

## 2022-10-13 NOTE — ED Notes (Signed)
VOL  TOC  PLACEMENT 

## 2022-10-13 NOTE — ED Notes (Signed)
Pt. Alert and oriented, warm and dry, in no distress. Pt. Denies SI, HI, and AVH. Pt. Encouraged to let nursing staff know of any concerns or needs.  ENVIRONMENTAL ASSESSMENT Potentially harmful objects out of patient reach: Yes.   Personal belongings secured: Yes.   Patient dressed in hospital provided attire only: Yes.   Plastic bags out of patient reach: Yes.   Patient care equipment (cords, cables, call bells, lines, and drains) shortened, removed, or accounted for: Yes.   Equipment and supplies removed from bottom of stretcher: Yes.   Potentially toxic materials out of patient reach: Yes.   Sharps container removed or out of patient reach: Yes.

## 2022-10-13 NOTE — ED Notes (Signed)
Snack provided

## 2022-10-14 NOTE — ED Provider Notes (Signed)
Emergency Medicine Observation Re-evaluation Note  Crystal [name] is a 32 y.o. female, seen on rounds today.  Pt initially presented to the ED for complaints of Placement  Patient currently sleeping  Physical Exam  Blood pressure (!) 161/84, pulse 94, temperature 97.9 F (36.6 C), temperature source Oral, resp. rate 18, height '5\' 1"'$  (1.549 m), weight 73 kg, SpO2 96 %.  Physical Exam: General: No apparent distress, sleeping Pulm: Normal WOB Psych: Resting comfortably     ED Course / MDM     I have reviewed the labs performed to date as well as medications administered while in observation.  Recent changes in the last 24 hours include: No acute events overnight.  Plan   Current plan: Social work disposition     Delman Kitten, MD 10/14/22 [phone]

## 2022-10-14 NOTE — ED Notes (Signed)
Pt given snack and beverage.

## 2022-10-14 NOTE — Progress Notes (Signed)
  Chaplain On-Call provided spiritual and emotional support and prayer with patient, while on the Unit visiting another patient.  The patient described her on-going stress related to uncertainty about the prospects for her to be placed in a new Group Home. Orly stated much frustration about her perceived disconnect between Education officer, museum and [place].  Chaplain offered encouragement and supportive listening.  Chaplain Charlie Maggie Dworkin M.Div., [facility]

## 2022-10-14 NOTE — ED Notes (Signed)
Patient complaining of back pain. Patient states she does not know what happened it started hurting real bad. Patient given PRN medication.

## 2022-10-14 NOTE — ED Notes (Signed)
Pt took shower °

## 2022-10-14 NOTE — ED Notes (Signed)
Pt was given breakfast and beverage. Pt ate 100%.

## 2022-10-14 NOTE — ED Notes (Signed)
Patient sitting in the dayroom by the door of another Patient and they are talking, laughing and interacting well, Nurse will continue to monitor, camera surveillance in progress for safety.

## 2022-10-14 NOTE — ED Notes (Signed)
Pt given snack and drink 

## 2022-10-14 NOTE — ED Notes (Signed)
VOL TOC placement 

## 2022-10-14 NOTE — ED Notes (Signed)
Pt given lunch and beverage. Pt ate 100%.

## 2022-10-14 NOTE — TOC Progression Note (Signed)
Transition of Care [facility]) - Progression Note    Patient Details  Name: Crystal [name] MRN: [id number] Date of Birth: Nov 08, 1990  [facility]) CM/SW Contact  Shelbie Hutching, RN Phone Number: 10/14/2022, 4:08 PM  Clinical Narrative:    Jeralene Huff out to [facility] with [facility] for an update on placement back with group home.         Expected Discharge Plan and Services                                               Social Determinants of Health (SDOH) Interventions SDOH Screenings   Alcohol Screen: Low Risk  (11/11/2020)  Depression (PHQ2-9): Low Risk  (03/17/2022)  Tobacco Use: High Risk (09/13/2022)    Readmission Risk Interventions     No data to display

## 2022-10-15 NOTE — ED Notes (Signed)
Pt given shower supplies

## 2022-10-15 NOTE — ED Notes (Signed)
Pt was seen getting a sock from another pt after that pts shower. This tech asked pt why she had it, pt states "I was going to cut it for a hair barrette" this tech told pt she already had one in her hair and did not need another one. Pt states "its not for me its for mary kate". This tech asked pt to throw the dirty sock in the trash. Pt did.   Pt then was heard telling another pt "oh my freaking god. mary kate I will get you another one. One way or another" with her head down in between her knees.   Pt then came up to security window and states "I need to talk to mrs andrea because I feel like I'm being mistreated around here so I need to talk to her about it" unclear if reasoning for this is d/t previous interaction. RN notified.

## 2022-10-15 NOTE — ED Notes (Signed)
Pt given breakfast.

## 2022-10-15 NOTE — ED Notes (Signed)
VOL  TOC  PLACEMENT 

## 2022-10-15 NOTE — ED Notes (Signed)
This tech obtained vitals on pt.

## 2022-10-15 NOTE — ED Notes (Signed)
Dinner was provided.

## 2022-10-15 NOTE — Progress Notes (Addendum)
   10/15/22 1600  Spiritual Encounters  Type of Visit Follow up  Care provided to: Patient  Referral source Patient request  Reason for visit Routine spiritual support  OnCall Visit Yes  Spiritual Needs Prayer;Emotional  Spiritual Framework  Presenting Themes Goals in life/care  Interventions  Spiritual Care Interventions Made Reflective listening  Intervention Outcomes  Outcomes Reduced isolation;Awareness of support  Spiritual Care Plan  Spiritual Care Issues Still Outstanding No further spiritual care needs at this time (see row info)   Stephane requested Chaplain to visit to reflect with her- on her ongoing emotional and Spiritual needs. I actively listened asking open ended questions. Pt was grateful for the visit.

## 2022-10-16 NOTE — ED Notes (Signed)
Vol toc placement

## 2022-10-16 NOTE — ED Provider Notes (Signed)
Emergency Medicine Observation Re-evaluation Note  Physical Exam   BP (!) 121/99 (BP Location: Left Arm)   Pulse 75   Temp 97.8 F (36.6 C) (Oral)   Resp 20   Ht '5\' 1"'$  (1.549 m)   Wt 73 kg   SpO2 96%   BMI 30.41 kg/m   Pt is calm and resting, respirations unlabored, and appears comfortable.   ED Course / MDM   No reported events during my shift at the time of this note.   Pt is awaiting dispo from [place] MD    Lucillie Garfinkel, MD 10/16/22 [phone]

## 2022-10-16 NOTE — ED Notes (Signed)
Snack and drink given

## 2022-10-16 NOTE — TOC Progression Note (Signed)
Transition of Care [facility] - Progression Note    Patient Details  Name: Crystal [name] MRN: [id number] Date of Birth: [birth year]  [facility]) CM/SW Contact  Shelbie Hutching, RN Phone Number: 10/16/2022, 12:59 PM  Clinical Narrative:    Received a call from [facility] with [facility].  Patient has been approved through [facility] for the [facility].  [name] has someone that she is hiring but needs to get trained.  Hopefully patient will be able to return to the group home after next week.         Expected Discharge Plan and Services                                               Social Determinants of Health (SDOH) Interventions SDOH Screenings   Alcohol Screen: Low Risk  (11/11/2020)  Depression (PHQ2-9): Low Risk  (03/17/2022)  Tobacco Use: High Risk (09/13/2022)    Readmission Risk Interventions     No data to display

## 2022-10-16 NOTE — ED Notes (Signed)
Pt sad and discouraged due to conversations with fellow patient. No negative behaviors noted, reports other pt not taking advice given. Pt expressed concerns and felt better after conversation

## 2022-10-17 NOTE — ED Notes (Signed)
Patient was given ice water. Also, had to use the bathroom

## 2022-10-17 NOTE — ED Notes (Signed)
VOL TOC placement 

## 2022-10-17 NOTE — ED Notes (Addendum)
This tech was speaking with another pt when this pt interrupted asking a question. This tech told pt "I am speaking with another patient right now". This pt then asked her question again after my conversation was over, pt was asking for a snack. This tech told her "yes".  Pt called this tech a "bitch" and stated "I get treated so differently around here". Pt was stating that this tech told her "no" to snack. Security educated pt on what was actually said. Pt states "oh I'm sorry I could've sworn you told me no"

## 2022-10-17 NOTE — ED Notes (Signed)
Given lunch and drink

## 2022-10-17 NOTE — ED Notes (Signed)
Received snack and water

## 2022-10-17 NOTE — ED Notes (Signed)
Comb returned to staff.

## 2022-10-17 NOTE — ED Provider Notes (Signed)
Emergency Medicine Observation Re-evaluation Note  Crystal [name] is a 32 y.o. female, seen in the emergency department for psychiatric concern.  No acute events since last update  Physical Exam  BP 106/81 (BP Location: Left Arm)   Pulse 69   Temp 97.6 F (36.4 C) (Oral)   Resp 18   Ht '5\' 1"'$  (1.549 m)   Wt 73 kg   SpO2 100%   BMI 30.41 kg/m    ED Course / MDM   No recent lab work for review  Plan  Current plan is for placement to an appropriate living facility once available.    Harvest Dark, MD 10/17/22 [phone]

## 2022-10-17 NOTE — ED Notes (Signed)
Pt showered and returned all provided hygiene products. Pt wearing clean scrubs at this time. Pt does have a comb which she was instructed to return when finished.

## 2022-10-18 NOTE — ED Notes (Signed)
Snack given.

## 2022-10-18 NOTE — ED Notes (Signed)
Hospital meal provided, pt tolerated w/o complaints.  Waste discarded appropriately.  

## 2022-10-18 NOTE — ED Notes (Signed)
Pt taking shower. Pt was given hygiene items and the following, 1 clean top, 1 clean bottom, with 1 pair of disposable underwear.  Pt changed out into clean clothing.  Staff disposed of all shower supplies.   

## 2022-10-18 NOTE — ED Notes (Signed)
vol/pending toc placement.. 

## 2022-10-18 NOTE — ED Notes (Signed)
Pt refused snack. Pt given water at this time.

## 2022-10-18 NOTE — ED Notes (Signed)
Changed linens for patient.

## 2022-10-18 NOTE — ED Notes (Signed)
Pt given lunch tray and water at this time.

## 2022-10-18 NOTE — ED Notes (Signed)
Hospital dinner tray and a fresh cup of ice water provided to pt. Meal fully consumed and disposed of properly.

## 2022-10-18 NOTE — ED Notes (Signed)
Patient is vol pending TOC placement 

## 2022-10-18 NOTE — ED Provider Notes (Signed)
Emergency Medicine Observation Re-evaluation Note  Crystal [name] is a 32 y.o. female, seen on rounds today.  Pt initially presented to the ED for complaints of Placement Currently, the patient is standing in day room, requesting a comb.  Physical Exam  BP 101/65 (BP Location: Left Arm)   Pulse (!) 112   Temp 98.3 F (36.8 C) (Oral)   Resp 18   Ht '5\' 1"'$  (1.549 m)   Wt 73 kg   SpO2 95%   BMI 30.41 kg/m  Physical Exam Constitutional: Resting comfortably. Eyes: Conjunctivae are normal. Head: Atraumatic. Nose: No congestion/rhinnorhea. Mouth/Throat: Mucous membranes are moist. Neck: Normal ROM Cardiovascular: No cyanosis noted. Respiratory: Normal respiratory effort. Gastrointestinal: Non-distended. Genitourinary: deferred Musculoskeletal: No lower extremity tenderness nor edema. Neurologic:  Normal speech and language. No gross focal neurologic deficits are appreciated. Skin:  Skin is warm, dry and intact. No rash noted.   ED Course / MDM  EKG:   I have reviewed the labs performed to date as well as medications administered while in observation.  Recent changes in the last 24 hours include none.  Plan  Current plan is for dispo per social work.    Blake Divine, MD 10/18/22 1106

## 2022-10-18 NOTE — ED Notes (Signed)
Pt given snack and water at this time.

## 2022-10-19 NOTE — Progress Notes (Signed)
   10/19/22 1500  Spiritual Encounters  Type of Visit Follow up  Care provided to: Patient  Conversation partners present during encounter Nurse  Referral source Patient request  Reason for visit Routine spiritual support  OnCall Visit No  Spiritual Needs Literature  Interventions  Spiritual Care Interventions Made Meaning making  Intervention Outcomes  Outcomes Reduced isolation   Chap visited with pt as he was rounding. She felt frustrated due to "not knowing where she will go after leaving the hospital. She requested for some hymnal lyrics which she had written down. Let me go look and see if we have someone who can help you with the songs since I am not conversant, Melven Sartorius told her.

## 2022-10-19 NOTE — ED Provider Notes (Signed)
Emergency Medicine Observation Re-evaluation Note  Crystal [name] is a 32 y.o. female, seen on rounds today.  Pt initially presented to the ED for complaints of Placement Patient remains in bed given the early hour  Physical Exam  BP 110/80 (BP Location: Right Arm)   Pulse 64   Temp 97.9 F (36.6 C) (Oral)   Resp 20   Ht 1.549 m ('5\' 1"'$ )   Wt 73 kg   SpO2 98%   BMI 30.41 kg/m  Physical Exam General: No issues overnight  Lungs: Normal respiratory rate Psych: Calm  ED Course / MDM  Social work has been in contact with group home  Plan  Current plan is for return to group home hopefully this week.    Lavonia Drafts, MD 10/19/22 [phone]

## 2022-10-19 NOTE — ED Notes (Signed)
Vol / pending TOC placement 

## 2022-10-19 NOTE — ED Notes (Signed)
VOl to go to group home 1/31

## 2022-10-19 NOTE — TOC Progression Note (Signed)
Transition of Care [facility]) - Progression Note    Patient Details  Name: Crystal [name] MRN: [id number] Date of Birth: 28-Jun-1991  Transition of Care [facility]) CM/SW Contact  Shelbie Hutching, RN Phone Number: 10/19/2022, 3:09 PM  Clinical Narrative:    Received an update from [place] with [facility].  Group home will accept patient back on Wed.  [name] will be here to pick patient up around 10 am 1/31.  Group home requests prescriptions be sent to [facility] in [place] for 30 day supply.          Expected Discharge Plan and Services                                               Social Determinants of Health (SDOH) Interventions SDOH Screenings   Alcohol Screen: Low Risk  (11/11/2020)  Depression (PHQ2-9): Low Risk  (03/17/2022)  Tobacco Use: High Risk (09/13/2022)    Readmission Risk Interventions     No data to display

## 2022-10-19 NOTE — ED Notes (Signed)
Patient provided PM snack & a cup of water.(vegetable cup with ranch and graham crackers with PB)

## 2022-10-20 MED ORDER — BUSPIRONE HCL 30 MG PO TABS: 30.0000 mg | ORAL_TABLET | Freq: Two times a day (BID) | ORAL | 0 refills | Status: AC

## 2022-10-20 MED ORDER — LAMIVUDINE 300 MG PO TABS: 300.0000 mg | ORAL_TABLET | Freq: Every day | ORAL | 0 refills | Status: AC

## 2022-10-20 MED ORDER — SERTRALINE HCL 100 MG PO TABS: 200.0000 mg | ORAL_TABLET | Freq: Every day | ORAL | 0 refills | Status: DC

## 2022-10-20 MED ORDER — MELATONIN 1 MG PO TABS: 1.0000 mg | ORAL_TABLET | Freq: Every day | ORAL | 0 refills | Status: AC

## 2022-10-20 MED ORDER — ACETAMINOPHEN 500 MG PO TABS: 500.0000 mg | ORAL_TABLET | Freq: Four times a day (QID) | ORAL | 0 refills | Status: DC | PRN

## 2022-10-20 MED ORDER — RISPERIDONE 1 MG PO TABS: 1.0000 mg | ORAL_TABLET | Freq: Two times a day (BID) | ORAL | 0 refills | Status: DC

## 2022-10-20 MED ORDER — IBUPROFEN 400 MG PO TABS: 800.0000 mg | ORAL_TABLET | Freq: Four times a day (QID) | ORAL | 0 refills | Status: DC | PRN

## 2022-10-20 MED ORDER — LORATADINE 10 MG PO TABS: 10.0000 mg | ORAL_TABLET | Freq: Every day | ORAL | 0 refills | Status: DC | PRN

## 2022-10-20 MED ORDER — TIVICAY 50 MG PO TABS: 50.0000 mg | ORAL_TABLET | Freq: Every day | ORAL | 0 refills | Status: AC

## 2022-10-20 MED ORDER — SENNA 8.6 MG PO TABS: 1.0000 | ORAL_TABLET | Freq: Every day | ORAL | 0 refills | Status: AC | PRN

## 2022-10-20 MED ORDER — POLYETHYLENE GLYCOL 3350 17 G PO PACK: 17.0000 g | PACK | Freq: Every day | ORAL | 0 refills | Status: DC

## 2022-10-20 MED ORDER — GABAPENTIN 100 MG PO CAPS: 100.0000 mg | ORAL_CAPSULE | Freq: Three times a day (TID) | ORAL | 0 refills | Status: DC

## 2022-10-20 NOTE — TOC Progression Note (Signed)
Transition of Care [facility]) - Progression Note    Patient Details  Name: Crystal [name] MRN: [id number] Date of Birth: 12-Feb-1991  Transition of Care [facility]) CM/SW Contact  Shelbie Hutching, RN Phone Number: 10/20/2022, 3:44 PM  Clinical Narrative:    Medications have been e scripted to [facility] in [place] per group home request, 30 day supply.  Plan for [name] to pick patient up at 10 am.         Expected Discharge Plan and Services                                               Social Determinants of Health (SDOH) Interventions SDOH Screenings   Alcohol Screen: Low Risk  (11/11/2020)  Depression (PHQ2-9): Low Risk  (03/17/2022)  Tobacco Use: High Risk (09/13/2022)    Readmission Risk Interventions     No data to display

## 2022-10-20 NOTE — ED Notes (Signed)
VOL  TOC  PLACEMENT 

## 2022-10-20 NOTE — ED Notes (Signed)
Pt given nighttime snack. 

## 2022-10-20 NOTE — ED Notes (Signed)
Patient took all po medications, no signs of distress, she states that she is bored, puzzle given to her, Staff will continue to monitor.

## 2022-10-20 NOTE — ED Notes (Signed)
Hospital meal provided, pt tolerated w/o complaints.  Waste discarded appropriately.  

## 2022-10-20 NOTE — ED Notes (Signed)
Pt ate of dinner with no complaints.

## 2022-10-20 NOTE — ED Notes (Signed)
Pt taking shower. Pt was given hygiene items and the following, 1 clean top, 1 clean bottom, with 1 pair of disposable underwear.  Pt changed out into clean clothing.  Staff disposed of all shower supplies.   

## 2022-10-21 NOTE — ED Notes (Signed)
Pt given shower supplies and change of clothing. Shower room unlocked for pt.

## 2022-10-21 NOTE — ED Provider Notes (Addendum)
-----------------------------------------   7:25 AM on 10/21/2022 -----------------------------------------   Blood pressure 103/70, pulse 77, temperature 97.9 F (36.6 C), temperature source Oral, resp. rate 18, height '5\' 1"'$  (1.549 m), weight 73 kg, SpO2 98 %.  The patient is calm and cooperative at this time.  There have been no acute events since the last update.  Awaiting disposition plan from case management/social work.  Plan for group home placement today  Right is here for group home placement.  Patient is legally guardian was notified.  Patient without any complaints.    Nathaniel Man, MD 10/21/22 Orson Eva    Nathaniel Man, MD 10/21/22 2811    Nathaniel Man, MD 10/21/22 1010

## 2022-10-21 NOTE — ED Notes (Signed)
Pt given breakfast tray and beverage.

## 2022-10-21 NOTE — ED Notes (Signed)
Vol /patient to be discharged today /Crystal [name] to pick up by 10:00 am

## 2022-11-14 ENCOUNTER — Emergency Department
Admission: EM | Admit: 2022-11-14 | Discharge: 2023-03-31 | Payer: Medicaid Other | Attending: Emergency Medicine | Admitting: Emergency Medicine

## 2022-11-14 ENCOUNTER — Other Ambulatory Visit: Payer: Self-pay

## 2022-11-14 DIAGNOSIS — F4325 Adjustment disorder with mixed disturbance of emotions and conduct: Secondary | ICD-10-CM | POA: Diagnosis present

## 2022-11-14 DIAGNOSIS — F329 Major depressive disorder, single episode, unspecified: Secondary | ICD-10-CM | POA: Diagnosis not present

## 2022-11-14 DIAGNOSIS — Z87891 Personal history of nicotine dependence: Secondary | ICD-10-CM | POA: Diagnosis not present

## 2022-11-14 DIAGNOSIS — Z85828 Personal history of other malignant neoplasm of skin: Secondary | ICD-10-CM | POA: Insufficient documentation

## 2022-11-14 DIAGNOSIS — F840 Autistic disorder: Secondary | ICD-10-CM | POA: Diagnosis not present

## 2022-11-14 DIAGNOSIS — R45851 Suicidal ideations: Secondary | ICD-10-CM

## 2022-11-14 DIAGNOSIS — F4321 Adjustment disorder with depressed mood: Secondary | ICD-10-CM

## 2022-11-14 DIAGNOSIS — Z20822 Contact with and (suspected) exposure to covid-19: Secondary | ICD-10-CM | POA: Insufficient documentation

## 2022-11-14 DIAGNOSIS — Z21 Asymptomatic human immunodeficiency virus [HIV] infection status: Secondary | ICD-10-CM | POA: Insufficient documentation

## 2022-11-14 DIAGNOSIS — R4589 Other symptoms and signs involving emotional state: Secondary | ICD-10-CM

## 2022-11-14 LAB — CBC
HCT: 39.3 % (ref 36.0–46.0)
Hemoglobin: 12.8 g/dL (ref 12.0–15.0)
MCH: 32.7 pg (ref 26.0–34.0)
MCHC: 32.6 g/dL (ref 30.0–36.0)
MCV: 100.3 fL — ABNORMAL HIGH (ref 80.0–100.0)
Platelets: 196 10*3/uL (ref 150–400)
RBC: 3.92 MIL/uL (ref 3.87–5.11)
RDW: 11.8 % (ref 11.5–15.5)
WBC: 5.9 10*3/uL (ref 4.0–10.5)
nRBC: 0 % (ref 0.0–0.2)

## 2022-11-14 LAB — COMPREHENSIVE METABOLIC PANEL
ALT: 12 U/L (ref 0–44)
AST: 14 U/L — ABNORMAL LOW (ref 15–41)
Albumin: 4.4 g/dL (ref 3.5–5.0)
Alkaline Phosphatase: 50 U/L (ref 38–126)
Anion gap: 7 (ref 5–15)
BUN: 14 mg/dL (ref 6–20)
CO2: 24 mmol/L (ref 22–32)
Calcium: 9.1 mg/dL (ref 8.9–10.3)
Chloride: 108 mmol/L (ref 98–111)
Creatinine, Ser: 1.13 mg/dL — ABNORMAL HIGH (ref 0.44–1.00)
GFR, Estimated: >60 mL/min (ref >60)
Glucose, Bld: 101 mg/dL — ABNORMAL HIGH (ref 70–99)
Potassium: 4.4 mmol/L (ref 3.5–5.1)
Sodium: 139 mmol/L (ref 135–145)
Total Bilirubin: 0.6 mg/dL (ref 0.3–1.2)
Total Protein: 7.4 g/dL (ref 6.5–8.1)

## 2022-11-14 LAB — SALICYLATE LEVEL: Salicylate Lvl: <7 mg/dL — ABNORMAL LOW (ref 7.0–30.0)

## 2022-11-14 LAB — ETHANOL: Alcohol, Ethyl (B): <10 mg/dL (ref <10)

## 2022-11-14 LAB — ACETAMINOPHEN LEVEL: Acetaminophen (Tylenol), Serum: <10 ug/mL — ABNORMAL LOW (ref 10–30)

## 2022-11-14 MED ORDER — SERTRALINE HCL 100 MG PO TABS
200.0000 mg | ORAL_TABLET | Freq: Every day | ORAL | Status: DC
  Administered 2022-11-14 – 2023-03-31 (×138): 200 mg via ORAL
  Filled 2022-11-14 (×31): qty 2
  Filled 2022-11-14: qty 4
  Filled 2022-11-14 (×67): qty 2
  Filled 2022-11-14: qty 4
  Filled 2022-11-14 (×25): qty 2
  Filled 2022-11-14: qty 4
  Filled 2022-11-14 (×12): qty 2

## 2022-11-14 MED ORDER — BUSPIRONE HCL 10 MG PO TABS
30.0000 mg | ORAL_TABLET | Freq: Two times a day (BID) | ORAL | Status: DC
  Administered 2022-11-14 – 2023-03-31 (×275): 30 mg via ORAL
  Filled 2022-11-14 (×28): qty 3
  Filled 2022-11-14: qty 6
  Filled 2022-11-14 (×60): qty 3
  Filled 2022-11-14: qty 6
  Filled 2022-11-14 (×27): qty 3
  Filled 2022-11-14: qty 6
  Filled 2022-11-14 (×13): qty 3
  Filled 2022-11-14: qty 6
  Filled 2022-11-14 (×19): qty 3
  Filled 2022-11-14: qty 6
  Filled 2022-11-14 (×18): qty 3
  Filled 2022-11-14: qty 6
  Filled 2022-11-14 (×20): qty 3
  Filled 2022-11-14: qty 6
  Filled 2022-11-14 (×28): qty 3
  Filled 2022-11-14: qty 6
  Filled 2022-11-14 (×55): qty 3

## 2022-11-14 MED ORDER — ALUM & MAG HYDROXIDE-SIMETH 200-200-20 MG/5ML PO SUSP
30.0000 mL | Freq: Four times a day (QID) | ORAL | Status: DC | PRN
  Administered 2023-01-26 – 2023-03-25 (×8): 30 mL via ORAL
  Filled 2022-11-14 (×10): qty 30

## 2022-11-14 MED ORDER — ONDANSETRON HCL 4 MG PO TABS
4.0000 mg | ORAL_TABLET | Freq: Three times a day (TID) | ORAL | Status: DC | PRN
  Administered 2022-12-02 – 2023-03-27 (×10): 4 mg via ORAL
  Filled 2022-11-14 (×15): qty 1

## 2022-11-14 MED ORDER — GABAPENTIN 100 MG PO CAPS
100.0000 mg | ORAL_CAPSULE | Freq: Three times a day (TID) | ORAL | Status: DC
  Administered 2022-11-14 – 2023-03-31 (×411): 100 mg via ORAL
  Filled 2022-11-14 (×411): qty 1

## 2022-11-14 MED ORDER — HYDROXYZINE HCL 25 MG PO TABS
100.0000 mg | ORAL_TABLET | Freq: Every day | ORAL | Status: DC
  Administered 2022-11-14 – 2023-03-30 (×137): 100 mg via ORAL
  Filled 2022-11-14 (×137): qty 4

## 2022-11-14 MED ORDER — RISPERIDONE 1 MG PO TABS
1.0000 mg | ORAL_TABLET | Freq: Two times a day (BID) | ORAL | Status: DC
  Administered 2022-11-14 – 2023-03-31 (×275): 1 mg via ORAL
  Filled 2022-11-14 (×275): qty 1

## 2022-11-14 MED ORDER — IBUPROFEN 600 MG PO TABS
600.0000 mg | ORAL_TABLET | Freq: Three times a day (TID) | ORAL | Status: DC | PRN
  Administered 2022-11-14 – 2023-03-29 (×182): 600 mg via ORAL
  Filled 2022-11-14 (×196): qty 1

## 2022-11-14 MED ORDER — SUVOREXANT 10 MG PO TABS: 1.0000 | ORAL_TABLET | Freq: Every day | ORAL | Status: DC

## 2022-11-14 NOTE — BH Assessment (Signed)
Writer called and left a HIPPA Compliant message with Legal Guardian ([name]-[phone]), requesting a return phone call.   Writer called the group home but was unable to reach anyone and no voicemail option to leave a message.

## 2022-11-14 NOTE — ED Provider Notes (Addendum)
[facility] Provider Note    Event Date/Time   First MD Initiated Contact with Patient 11/14/22 0932     (approximate)   History   Chief Complaint: Back Pain, suicidal ideation, and Homicidal   HPI  Crystal [name] is a 32 y.o. female with a history of autism spectrum disorder, adjustment disorder, HIV who is brought to the ED due to running away from her group home this morning.  Reports that she does not like it there.  Also complains of back pain which is mild and chronic.  She endorses suicidal thoughts but without an actionable plan.     Physical Exam   Triage Vital Signs: ED Triage Vitals  Enc Vitals Group     BP 11/14/22 0829 102/73     Pulse Rate 11/14/22 0829 77     Resp 11/14/22 0829 16     Temp 11/14/22 0829 (!) 97.3 F (36.3 C)     Temp Source 11/14/22 0829 Oral     SpO2 11/14/22 0829 98 %     Weight 11/14/22 0823 150 lb (68 kg)     Height 11/14/22 0823 '5\' 6"'$  (1.676 m)     Head Circumference --      Peak Flow --      Pain Score 11/14/22 0823 8     Pain Loc --      Pain Edu? --      Excl. in [place]? --     Most recent vital signs: Vitals:   11/14/22 0829  BP: 102/73  Pulse: 77  Resp: 16  Temp: (!) 97.3 F (36.3 C)  SpO2: 98%    General: Awake, no distress.  Ambulatory CV:  Good peripheral perfusion.  Resp:  Normal effort.  Abd:  No distention.  Other:  No wounds, no signs of trauma.   ED Results / Procedures / Treatments   Labs (all labs ordered are listed, but only abnormal results are displayed) Labs Reviewed  COMPREHENSIVE METABOLIC PANEL - Abnormal; Notable for the following components:      Result Value   Glucose, Bld 101 (*)    Creatinine, Ser 1.13 (*)    AST 14 (*)    All other components within normal limits  SALICYLATE LEVEL - Abnormal; Notable for the following components:   Salicylate Lvl [id number] (*)    All other components within normal limits  ACETAMINOPHEN LEVEL - Abnormal; Notable for the following  components:   Acetaminophen (Tylenol), Serum <10 (*)    All other components within normal limits  CBC - Abnormal; Notable for the following components:   MCV 100.3 (*)    All other components within normal limits  ETHANOL  URINE DRUG SCREEN, QUALITATIVE ([facility] ONLY)  POC URINE PREG, ED     EKG    RADIOLOGY    PROCEDURES:  Procedures   MEDICATIONS ORDERED IN ED: Medications  ibuprofen (ADVIL) tablet 600 mg (600 mg Oral Given 11/14/22 0947)  ondansetron (ZOFRAN) tablet 4 mg (has no administration in time range)  alum & mag hydroxide-simeth (MAALOX/MYLANTA) 200-200-20 MG/5ML suspension 30 mL (has no administration in time range)     IMPRESSION / MDM / ASSESSMENT AND PLAN / ED COURSE  I reviewed the triage vital signs and the nursing notes.  Patient's presentation is most consistent with severe exacerbation of chronic illness.  Patient presents with suicidal thoughts along with running away from her group home.  Will consult psychiatry for further evaluation  The patient has  been placed in psychiatric observation due to the need to provide a safe environment for the patient while obtaining psychiatric consultation and evaluation, as well as ongoing medical and medication management to treat the patient's condition.  The patient has not been placed under full IVC at this time.   ----------------------------------------- 3:05 PM on 11/14/2022 ----------------------------------------- Evaluated by psychiatry, note reviewed, cleared for discharge.     FINAL CLINICAL IMPRESSION(S) / ED DIAGNOSES   Final diagnoses:  Adjustment disorder with depressed mood  Autism spectrum disorder     Rx / DC Orders   ED Discharge Orders     None        Note:  This document was prepared using Dragon voice recognition software and may include unintentional dictation errors.   Carrie Mew, MD 11/14/22 1035    Carrie Mew, MD 11/14/22 [phone]

## 2022-11-14 NOTE — ED Notes (Signed)
RN attempted to contact [facility] group [facility] with no reply. The option to leave voicemail was not available.

## 2022-11-14 NOTE — ED Notes (Signed)
Patient is vol pending social worker consult,because group home is refusing to take patient back

## 2022-11-14 NOTE — ED Notes (Signed)
RN attempted to reach pt's guardian J. Truddie Coco (guardian), no answer.

## 2022-11-14 NOTE — ED Notes (Signed)
Breakfast tray given. °

## 2022-11-14 NOTE — ED Notes (Signed)
RN attempted to contact guardian. Left voicemail for Mardene Speak.
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
## 2022-11-14 NOTE — ED Notes (Signed)
Pt given nighttime snack. 
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
## 2022-11-14 NOTE — BH Assessment (Signed)
Writer called and left a HIPPA Compliant message with Legal Guardian ([name]-[phone]), requesting a return phone call.

## 2022-11-14 NOTE — ED Notes (Signed)
Pt belongings include: (bag 1 of 1) White socks Black sneakers Blue-white blanket Gray pants Dark blue underwear Red long sleeve shirt Tan bra
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
## 2022-11-14 NOTE — ED Notes (Signed)
Pt given ice cream, crackers, and peanut butter.

## 2022-11-14 NOTE — Consult Note (Signed)
[facility] Face-to-Face Psychiatry Consult   Reason for Consult:  upset with her group home and ran away Referring Physician:  EDP Patient Identification: Crystal [name] MRN:  [id number] Principal Diagnosis: Adjustment disorder with mixed disturbance of emotions and conduct Diagnosis:  Principal Problem:   Adjustment disorder with mixed disturbance of emotions and conduct   Total Time spent with patient: 45 minutes  Subjective:   Crystal [name] is a 32 y.o. female patient admitted with agitation and running away from her group after getting upset with staff.  HPI:  32 yo female with Autism, IDD, and behavior issues.  She is calm and cooperative on assessment as she explains she was told to make her bed and she did.  However, the group home staff was not happy with it and she got upset.  This is the reason she left the group home.  No suicidal/homicidal ideations on assessment, no psychosis, or substance abuse.  Mild anxiety with no depression.  She has medications and an outpatient provider.  Her guardian was called, no answer, message left.  Psych cleared and will continue to contact her guardian.  Past Psychiatric History: Autism, depression  Risk to Self:  none Risk to Others:  none Prior Inpatient Therapy:  several Prior Outpatient Therapy:  in place, could not remember the name  Past Medical History:  Past Medical History:  Diagnosis Date   Cancer ([place])    skin   Depression    HIV (human immunodeficiency virus infection) ([place])     Past Surgical History:  Procedure Laterality Date   SKIN SURGERY     Family History: History reviewed. No pertinent family history. Family Psychiatric  History: none Social History:  Social History   Substance and Sexual Activity  Alcohol Use Not Currently     Social History   Substance and Sexual Activity  Drug Use Not Currently    Social History   Socioeconomic History   Marital status: Single    Spouse name: Not on file   Number of  children: Not on file   Years of education: Not on file   Highest education level: Not on file  Occupational History   Not on file  Tobacco Use   Smoking status: Former    Packs/day: 0.50    Years: 12.00    Total pack years: 6.00    Types: Cigarettes    Quit date: 09/2022    Years since quitting: 0.1   Smokeless tobacco: Never  Vaping Use   Vaping Use: Never used  Substance and Sexual Activity   Alcohol use: Not Currently   Drug use: Not Currently   Sexual activity: Not on file  Other Topics Concern   Not on file  Social History Narrative   Not on file   Social Determinants of Health   Financial Resource Strain: Not on file  Food Insecurity: Not on file  Transportation Needs: Not on file  Physical Activity: Not on file  Stress: Not on file  Social Connections: Not on file   Additional Social History:    Allergies:  No Known Allergies  Labs:  Results for orders placed or performed during the hospital encounter of 11/14/22 (from the past 48 hour(s))  Comprehensive metabolic panel     Status: Abnormal   Collection Time: 11/14/22  8:29 AM  Result Value Ref Range   Sodium 139 135 - 145 mmol/L   Potassium 4.4 3.5 - 5.1 mmol/L   Chloride 108 98 - 111 mmol/L  CO2 24 22 - 32 mmol/L   Glucose, Bld 101 (H) 70 - 99 mg/dL    Comment: Glucose reference range applies only to samples taken after fasting for at least 8 hours.   BUN 14 6 - 20 mg/dL   Creatinine, Ser 1.13 (H) 0.44 - 1.00 mg/dL   Calcium 9.1 8.9 - 10.3 mg/dL   Total Protein 7.4 6.5 - 8.1 g/dL   Albumin 4.4 3.5 - 5.0 g/dL   AST 14 (L) 15 - 41 U/L   ALT 12 0 - 44 U/L   Alkaline Phosphatase 50 38 - 126 U/L   Total Bilirubin 0.6 0.3 - 1.2 mg/dL   GFR, Estimated >60 >60 mL/min    Comment: (NOTE) Calculated using the CKD-EPI Creatinine Equation (2021)    Anion gap 7 5 - 15    Comment: Performed at [facility], [address] [place], [place] [address]  Ethanol     Status: None   Collection Time:  11/14/22  8:29 AM  Result Value Ref Range   Alcohol, Ethyl (B) <10 <10 mg/dL    Comment: (NOTE) Lowest detectable limit for serum alcohol is 10 mg/dL.  For medical purposes only. Performed at [facility], [address] [place], [place] [id number]   Salicylate level     Status: Abnormal   Collection Time: 11/14/22  8:29 AM  Result Value Ref Range   Salicylate Lvl Q000111Q (L) 7.0 - 30.0 mg/dL    Comment: Performed at [facility], [address] [place], [place] [address]  Acetaminophen level     Status: Abnormal   Collection Time: 11/14/22  8:29 AM  Result Value Ref Range   Acetaminophen (Tylenol), Serum <10 (L) 10 - 30 ug/mL    Comment: (NOTE) Therapeutic concentrations vary significantly. A range of 10-30 ug/mL  may be an effective concentration for many patients. However, some  are best treated at concentrations outside of this range. Acetaminophen concentrations >150 ug/mL at 4 hours after ingestion  and >50 ug/mL at 12 hours after ingestion are often associated with  toxic reactions.  Performed at [facility], [address] [place], [place] [address]   cbc     Status: Abnormal   Collection Time: 11/14/22  8:29 AM  Result Value Ref Range   WBC 5.9 4.0 - 10.5 K/uL   RBC 3.92 3.87 - 5.11 MIL/uL   Hemoglobin 12.8 12.0 - 15.0 g/dL   HCT 39.3 36.0 - 46.0 %   MCV 100.3 (H) 80.0 - 100.0 fL   MCH 32.7 26.0 - 34.0 pg   MCHC 32.6 30.0 - 36.0 g/dL   RDW 11.8 11.5 - 15.5 %   Platelets 196 150 - 400 K/uL   nRBC 0.0 0.0 - 0.2 %    Comment: Performed at [facility], [address] [place], [place] [address]    Current Facility-Administered Medications  Medication Dose Route Frequency Provider Last Rate Last Admin   alum & mag hydroxide-simeth (MAALOX/MYLANTA) 200-200-20 MG/5ML suspension 30 mL  30 mL Oral Q6H PRN Carrie Mew, MD       ibuprofen (ADVIL) tablet 600 mg  600 mg Oral Q8H PRN Carrie Mew, MD   600 mg at 11/14/22  0947   ondansetron (ZOFRAN) tablet 4 mg  4 mg Oral Q8H PRN Carrie Mew, MD       Current Outpatient Medications  Medication Sig Dispense Refill   acetaminophen (TYLENOL) 325 MG tablet Take 650 mg by mouth every  6 (six) hours as needed.     acetaminophen (TYLENOL) 500 MG tablet Take 1 tablet (500 mg total) by mouth every 6 (six) hours as needed. 30 tablet 0   busPIRone (BUSPAR) 30 MG tablet Take 1 tablet (30 mg total) by mouth 2 (two) times daily. 60 tablet 1   Cholecalciferol (VITAMIN D3) 1.25 MG (50000 UT) TABS Take 1 capsule by mouth once a week.     docusate sodium (COLACE) 100 MG capsule Take 1 capsule (100 mg total) by mouth 2 (two) times daily. 60 capsule 1   dolutegravir-lamiVUDine (DOVATO) 50-300 MG tablet Take 1 tablet by mouth daily. (Patient taking differently: Take 2 tablets by mouth daily.) 30 tablet 5   gabapentin (NEURONTIN) 100 MG capsule Take 1 capsule (100 mg total) by mouth 3 (three) times daily. 90 capsule 0   ibuprofen (ADVIL) 400 MG tablet Take 2 tablets (800 mg total) by mouth every 6 (six) hours as needed for headache. 30 tablet 0   loratadine (CLARITIN) 10 MG tablet Take 1 tablet (10 mg total) by mouth daily as needed for allergies. 30 tablet 0   medroxyPROGESTERone (DEPO-PROVERA) 150 MG/ML injection Inject 150 mg into the muscle every 3 (three) months.     polyethylene glycol (MIRALAX) 17 g packet Take 17 g by mouth daily. 14 each 0   prazosin (MINIPRESS) 2 MG capsule Take 2 mg by mouth at bedtime.     senna (SENOKOT) 8.6 MG TABS tablet Take 1 tablet (8.6 mg total) by mouth daily as needed for mild constipation. 30 tablet 0   sertraline (ZOLOFT) 100 MG tablet Take 2 tablets (200 mg total) by mouth daily. 60 tablet 0   Suvorexant (BELSOMRA) 10 MG TABS Take 1 tablet by mouth at bedtime.     vitamin B-12 (CYANOCOBALAMIN) 1000 MCG tablet Take 1,000 mcg by mouth daily.     ARIPiprazole (ABILIFY) 20 MG tablet Take 1 tablet (20 mg total) by mouth daily. (Patient not  taking: Reported on 11/14/2022) 30 tablet 1   atorvastatin (LIPITOR) 20 MG tablet Take 20 mg by mouth daily. (Patient not taking: Reported on 11/14/2022)     busPIRone (BUSPAR) 30 MG tablet Take 1 tablet (30 mg total) by mouth 2 (two) times daily. (Patient not taking: Reported on 11/14/2022) 60 tablet 0   dolutegravir (TIVICAY) 50 MG tablet Take 1 tablet (50 mg total) by mouth daily. (Patient not taking: Reported on 11/14/2022) 30 tablet 0   hydrOXYzine (VISTARIL) 100 MG capsule Take 1 capsule (100 mg total) by mouth at bedtime. (Patient not taking: Reported on 04/01/2022) 30 capsule 1   lamivudine (EPIVIR) 300 MG tablet Take 1 tablet (300 mg total) by mouth daily. (Patient not taking: Reported on 11/14/2022) 30 tablet 0   linaclotide (LINZESS) 145 MCG CAPS capsule Take 145 mcg by mouth daily before breakfast. (Patient not taking: Reported on 11/14/2022)     melatonin 1 MG TABS tablet Take 1 tablet (1 mg total) by mouth at bedtime. (Patient not taking: Reported on 11/14/2022) 30 tablet 0   melatonin 3 MG TABS tablet Take 2 tablets (6 mg total) by mouth at bedtime. (Patient not taking: Reported on 04/01/2022) 60 tablet 1   risperiDONE (RISPERDAL) 1 MG tablet Take 1 tablet (1 mg total) by mouth 2 (two) times daily. (Patient not taking: Reported on 11/14/2022) 60 tablet 0    Musculoskeletal: Strength & Muscle Tone: within normal limits Gait & Station: normal Patient leans: N/A  Psychiatric Specialty Exam: Physical Exam Vitals  and nursing note reviewed.  Constitutional:      Appearance: Normal appearance.  HENT:     Head: Normocephalic.     Nose: Nose normal.  Pulmonary:     Effort: Pulmonary effort is normal.  Musculoskeletal:        General: Normal range of motion.     Cervical back: Normal range of motion.  Neurological:     General: No focal deficit present.     Mental Status: She is oriented to person, place, and time.  Psychiatric:        Attention and Perception: Attention and perception  normal.        Mood and Affect: Mood is anxious.        Behavior: Behavior normal. Behavior is cooperative.        Thought Content: Thought content normal.        Cognition and Memory: Cognition is impaired.        Judgment: Judgment is impulsive.     Review of Systems  Psychiatric/Behavioral:  The patient is nervous/anxious.   All other systems reviewed and are negative.   Blood pressure 102/73, pulse 77, temperature (!) 97.3 F (36.3 C), temperature source Oral, resp. rate 16, height '5\' 6"'$  (1.676 m), weight 68 kg, SpO2 98 %.Body mass index is 24.21 kg/m.  General Appearance: Casual  Eye Contact:  Good  Speech:  Normal Rate  Volume:  Normal  Mood:  anxiety  Affect:  Blunt  Thought Process:  Coherent  Orientation:  Full (Time, Place, and Person)  Thought Content:  WDL and Logical  Suicidal Thoughts:  No  Homicidal Thoughts:  No  Memory:  Immediate;   Fair Recent;   Fair Remote;   Good  Judgement:  Fair  Insight:  Fair  Psychomotor Activity:  Normal  Concentration:  Concentration: Fair and Attention Span: Fair  Recall:  [employer] of Knowledge:  Fair  Language:  Good  Akathisia:  No  Handed:  Right  AIMS (if indicated):     Assets:  Housing Leisure Time Resilience Social Support  ADL's:  Intact  Cognition:  Impaired,  Mild  Sleep:        Physical Exam: Physical Exam Vitals and nursing note reviewed.  Constitutional:      Appearance: Normal appearance.  HENT:     Head: Normocephalic.     Nose: Nose normal.  Pulmonary:     Effort: Pulmonary effort is normal.  Musculoskeletal:        General: Normal range of motion.     Cervical back: Normal range of motion.  Neurological:     General: No focal deficit present.     Mental Status: She is oriented to person, place, and time.  Psychiatric:        Attention and Perception: Attention and perception normal.        Mood and Affect: Mood is anxious.        Behavior: Behavior normal. Behavior is cooperative.         Thought Content: Thought content normal.        Cognition and Memory: Cognition is impaired.        Judgment: Judgment is impulsive.    Review of Systems  Psychiatric/Behavioral:  The patient is nervous/anxious.   All other systems reviewed and are negative.  Blood pressure 102/73, pulse 77, temperature (!) 97.3 F (36.3 C), temperature source Oral, resp. rate 16, height '5\' 6"'$  (1.676 m), weight 68 kg, SpO2 98 %.  Body mass index is 24.21 kg/m.  Treatment Plan Summary: Adjustment disorder with mixed disturbance of emotion and conduct: Continue current medication regiment and follow up with outpatient provider  Disposition: No evidence of imminent risk to self or others at present.   Patient does not meet criteria for psychiatric inpatient admission. Supportive therapy provided about ongoing stressors.  Waylan Boga, NP 11/14/2022 11:10 AM

## 2022-11-14 NOTE — ED Notes (Signed)
RN spoke with group home employee who stated pt is not welcome back at group home facility indefinetly. Roselyn Reef NP notified.

## 2022-11-15 LAB — URINE DRUG SCREEN, QUALITATIVE (ARMC ONLY)
Amphetamines, Ur Screen: NOT DETECTED
Barbiturates, Ur Screen: NOT DETECTED
Benzodiazepine, Ur Scrn: NOT DETECTED
Cannabinoid 50 Ng, Ur ~~LOC~~: NOT DETECTED
Cocaine Metabolite,Ur ~~LOC~~: NOT DETECTED
MDMA (Ecstasy)Ur Screen: NOT DETECTED
Methadone Scn, Ur: NOT DETECTED
Opiate, Ur Screen: NOT DETECTED
Phencyclidine (PCP) Ur S: NOT DETECTED
Tricyclic, Ur Screen: NOT DETECTED

## 2022-11-15 LAB — POC URINE PREG, ED: Preg Test, Ur: NEGATIVE

## 2022-11-15 MED ORDER — MELATONIN 5 MG PO TABS
5.0000 mg | ORAL_TABLET | Freq: Every day | ORAL | Status: DC
  Administered 2022-11-15 – 2023-03-30 (×136): 5 mg via ORAL
  Filled 2022-11-15 (×137): qty 1

## 2022-11-15 MED ORDER — DIVALPROEX SODIUM 125 MG PO DR TAB
125.0000 mg | DELAYED_RELEASE_TABLET | Freq: Two times a day (BID) | ORAL | Status: DC
  Administered 2022-11-15 – 2023-03-23 (×257): 125 mg via ORAL
  Filled 2022-11-15 (×261): qty 1

## 2022-11-15 NOTE — ED Notes (Signed)
Pt threw comb away

## 2022-11-15 NOTE — ED Notes (Signed)
Pt's linens changed by EDT

## 2022-11-15 NOTE — ED Notes (Signed)
Dinner tray given at this time.

## 2022-11-15 NOTE — ED Notes (Signed)
Received report from [place], Therapist, sports. Pt has discharge orders but per report home group called and nurse was informed pt is not welcome to group home.

## 2022-11-15 NOTE — ED Provider Notes (Signed)
-----------------------------------------   6:48 AM on 11/15/2022 -----------------------------------------   Blood pressure 93/62, pulse 62, temperature 98.7 F (37.1 C), temperature source Oral, resp. rate 16, height 1.676 m (5' 6"$ ), weight 68 kg, SpO2 97 %.  The patient is calm and cooperative at this time.  Reportedly the patient was cleared by psychiatry and the ED team tried to discharge her back to her group home, but the group home refused due to the threats made by the patient to the group home staff.  Given their refusal to take the patient back, the patient was kept in the emergency department for her safety and a TOC consult has been placed to assist with appropriate placement.   Hinda Kehr, MD 11/15/22 [phone]

## 2022-11-15 NOTE — ED Notes (Signed)
Pt given shower supplies and taking shower at this time.

## 2022-11-15 NOTE — ED Notes (Signed)
TOC/pending discharge

## 2022-11-15 NOTE — ED Notes (Signed)
Pt given ice water.

## 2022-11-15 NOTE — ED Notes (Signed)
Pt out of shower. Pt given towels to clean up water on ground of shower

## 2022-11-15 NOTE — ED Notes (Signed)
pt recieved snack and drink

## 2022-11-15 NOTE — ED Notes (Signed)
Pt given comb per request

## 2022-11-15 NOTE — ED Notes (Signed)
Pt given blankets

## 2022-11-15 NOTE — ED Notes (Signed)
Pt given lunch tray at this time. Pt also provided with grape juice.

## 2022-11-15 NOTE — ED Notes (Signed)
Pt complaining of back pain. Pt given some ibuprofen for pain.

## 2022-11-15 NOTE — ED Notes (Signed)
Pt given breakfast tray and drink at this time.

## 2022-11-16 MED ORDER — LAMIVUDINE 150 MG PO TABS
300.0000 mg | ORAL_TABLET | Freq: Every day | ORAL | Status: DC
  Administered 2022-11-16 – 2023-03-31 (×136): 300 mg via ORAL
  Filled 2022-11-16 (×136): qty 2

## 2022-11-16 MED ORDER — DOLUTEGRAVIR SODIUM 50 MG PO TABS
50.0000 mg | ORAL_TABLET | Freq: Every day | ORAL | Status: DC
  Administered 2022-11-16 – 2023-03-31 (×136): 50 mg via ORAL
  Filled 2022-11-16 (×136): qty 1

## 2022-11-16 NOTE — ED Provider Notes (Signed)
-----------------------------------------   4:45 AM on 11/16/2022 -----------------------------------------   Blood pressure 108/69, pulse 73, temperature (!) 97.3 F (36.3 C), temperature source Oral, resp. rate 15, height 5' 6"$  (1.676 m), weight 68 kg, SpO2 100 %.  The patient is calm and cooperative at this time.  There have been no acute events since the last update.  Awaiting disposition plan from case management/social work.    Ashlyn Cabler, Delice Bison, DO 11/16/22 [phone]

## 2022-11-16 NOTE — TOC Initial Note (Addendum)
Transition of Care [facility]) - Initial/Assessment Note    Patient Details  Name: Crystal [name] MRN: [id number] Date of Birth: [birth year]  Transition of Care [facility]) CM/SW Contact:    Crystal Bash, LCSW Phone Number: 11/16/2022, 9:06 AM  Clinical Narrative:                  Update 4pm: CSW spoke with Crystal [name] with [facility] she reports the group home will not take patient back and that she will start looking for placement tomorrow Tuesday.     CSW notes patient is from [place], ran away Saturday 2/24, returned to group home but was making harmful statements regarding group home owner in which [employer] was called, Psych has cleared patient and reports no SI/HI.   CSW has lvm with patient's guardian Crystal [name] at [phone].   CSW has called [place] at [phone] no answer and unable to lvm.   CSW has spoken with patient's case manager with [facility] Crystal [name] at [phone]. Crystal [name] reports she will follow up with group home to confirm patient can return, reports right now plan would be for patient to return to [facility]. Crystal [name] to call this CSW back shortly with confirmed plan.    Expected Discharge Plan: Group Home Barriers to Discharge:  (pending group home acceptance back)   Patient Goals and CMS Choice   CMS Medicare.gov Compare Post Acute Care list provided to:: Legal Guardian        Expected Discharge Plan and Services                                              Prior Living Arrangements/Services                       Activities of Daily Living      Permission Sought/Granted                  Emotional Assessment              Admission diagnosis:  back pain ems Patient Active Problem List   Diagnosis Date Noted   Autistic spectrum disorder 04/01/2022   Suicidal ideation    Self-inflicted laceration of wrist, initial encounter ([place]) 11/08/2020   HIV (human immunodeficiency virus infection) ([place])    Mild  intellectual disability 11/07/2020   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   PCP:  Crystal Bon, NP Pharmacy:   [place], [place] - [address] [place] [address] Phone: [phone] Fax: [phone]     Social Determinants of Health (SDOH) Social History: SDOH Screenings   Alcohol Screen: Low Risk  (11/11/2020)  Depression (PHQ2-9): Low Risk  (03/17/2022)  Tobacco Use: Medium Risk (11/14/2022)   SDOH Interventions:     Readmission Risk Interventions     No data to display

## 2022-11-16 NOTE — ED Notes (Signed)
VOL/Pending Placement 

## 2022-11-16 NOTE — ED Notes (Signed)
lunch tray providen.

## 2022-11-16 NOTE — ED Notes (Signed)
vol/pending toc placement.Marland Kitchen

## 2022-11-16 NOTE — ED Notes (Signed)
VOL  moved  to  [facility]

## 2022-11-16 NOTE — ED Notes (Addendum)
Hospital meal provided, pt tolerated w/o complaints.  Waste discarded appropriately Pt given milk with dinner

## 2022-11-16 NOTE — ED Notes (Signed)
SNACKS GIVEN.

## 2022-11-17 NOTE — ED Notes (Signed)
pt recieved snack and drink

## 2022-11-17 NOTE — ED Notes (Signed)
vol/pending placement.Marland Kitchen

## 2022-11-17 NOTE — ED Notes (Signed)
Dinner tray and drink given to pt

## 2022-11-17 NOTE — Progress Notes (Signed)
   11/17/22 1500  Spiritual Encounters  Type of Visit Initial  Care provided to: Patient  Conversation partners present during encounter Nurse  Referral source Chaplain assessment  Reason for visit Routine spiritual support  OnCall Visit No  Interventions  Spiritual Care Interventions Made Reconciliation with self/others  Intervention Outcomes  Outcomes Awareness of support  Spiritual Care Plan  Spiritual Care Issues Still Outstanding No further spiritual care needs at this time (see row info)   During routine rounding Chap noticed pt  back in the ED. Pt shared why she escaped from group home - that she "was not respected by her one  on one" This Chap listened attentively as well as encouraged the pt to consider modeling respect where she feel un respected in the future instead of running away.

## 2022-11-17 NOTE — ED Notes (Signed)
VOL  TOC  PLACEMENT

## 2022-11-17 NOTE — ED Notes (Addendum)
Pt had spilled milk on bed, pt was given new sheets and blanket. Pt had also received box of tissues per request.

## 2022-11-18 NOTE — ED Provider Notes (Signed)
Emergency Medicine Observation Re-evaluation Note  Crystal [name] is a 32 y.o. female, seen on rounds today.  Pt initially presented to the ED for complaints of Back Pain, suicidal ideation, and Homicidal Currently, the patient is calm, in NAD.  Physical Exam  BP 108/71   Pulse 70   Temp (!) 97.5 F (36.4 C) (Oral)   Resp 16   Ht '5\' 6"'$  (1.676 m)   Wt 68 kg   SpO2 100%   BMI 24.21 kg/m    ED Course / MDM  EKG:   I have reviewed the labs performed to date as well as medications administered while in observation.  Recent changes in the last 24 hours include none.  Plan  Current plan is for TOC dispo.    Duffy Bruce, MD 11/18/22 [phone]

## 2022-11-18 NOTE — ED Notes (Signed)
Hospital meal provided, pt tolerated w/o complaints.  Waste discarded appropriately.  

## 2022-11-18 NOTE — ED Notes (Signed)
Patient is at the door asking if she can change rooms with [place], Nurse let her know that she could not move to another room at this time. Patient states " ok"

## 2022-11-18 NOTE — ED Notes (Signed)
Patient ate 100% of lunch and beverage, no signs of distress, she does come to the door multiple times asking for different items, she stands at door and stares at staff. Nurse encouraged her to go to her room and watch TV, she refused.

## 2022-11-18 NOTE — ED Provider Notes (Signed)
Emergency Medicine Observation Re-evaluation Note  Crystal [name] is a 32 y.o. female, seen on rounds today.  Pt initially presented to the ED for complaints of Back Pain, suicidal ideation, and Homicidal Currently, the patient is resting in her room.  Physical Exam  BP 108/71   Pulse 70   Temp (!) 97.5 F (36.4 C) (Oral)   Resp 16   Ht '5\' 6"'$  (1.676 m)   Wt 68 kg   SpO2 100%   BMI 24.21 kg/m    ED Course / MDM   I have reviewed the labs performed to date as well as medications administered while in observation.  Recent changes in the last 24 hours include none.  Plan  Current plan is for TOC disposition.    Arta Silence, MD 11/18/22 [phone]

## 2022-11-18 NOTE — TOC Progression Note (Signed)
Transition of Care [facility] - Progression Note    Patient Details  Name: Ewa Kot MRN: [id number] Date of Birth: [birth year]  Transition of Care [facility]) CM/SW [place], [place] Phone Number: 11/18/2022, 3:33 PM  Clinical Narrative:     [name] with [facility] reports she is presenting patient's case to leadership tomorrow, requested Medication List. CSW provided via email at [email or web address]  Expected Discharge Plan: Group Home Barriers to Discharge:  (pending group home acceptance back)  Expected Discharge Plan and Services                                               Social Determinants of Health (SDOH) Interventions SDOH Screenings   Alcohol Screen: Low Risk  (11/11/2020)  Depression (PHQ2-9): Low Risk  (03/17/2022)  Tobacco Use: Medium Risk (11/14/2022)    Readmission Risk Interventions     No data to display

## 2022-11-18 NOTE — ED Notes (Signed)
VOL/pending placement 

## 2022-11-18 NOTE — ED Notes (Signed)
Vol /pending TOC placement

## 2022-11-19 NOTE — ED Notes (Signed)
Pt given one crayon and piece of paper

## 2022-11-19 NOTE — ED Notes (Signed)
Dinner tray given

## 2022-11-19 NOTE — ED Provider Notes (Signed)
Emergency Medicine Observation Re-evaluation Note  Crystal [name] is a 32 y.o. female, seen on rounds today.  Pt initially presented to the ED for complaints of Back Pain, suicidal ideation, and Homicidal  Physical Exam  BP 103/67 (BP Location: Right Arm)   Pulse 66   Temp 98.5 F (36.9 C) (Oral)   Resp 16   Ht '5\' 6"'$  (1.676 m)   Wt 68 kg   SpO2 98%   BMI 24.21 kg/m  Physical Exam General: nad  ED Course / MDM  EKG:   I have reviewed the labs performed to date as well as medications administered while in observation.    Plan  Current plan is for toc.    Merlyn Lot, MD 11/19/22 [phone]

## 2022-11-19 NOTE — ED Notes (Signed)
VOL TOC Placement

## 2022-11-19 NOTE — ED Notes (Signed)
Pharmacy messaged about missing pt specific medicine.

## 2022-11-19 NOTE — ED Notes (Signed)
Pt took shower °

## 2022-11-19 NOTE — ED Notes (Signed)
Pt given nighttime snack. 

## 2022-11-19 NOTE — ED Notes (Signed)
vol/ pending toc placement.Marland Kitchen

## 2022-11-19 NOTE — ED Notes (Signed)
Report received from Kimber[place], RN

## 2022-11-20 NOTE — ED Notes (Signed)
Patient given snack.  

## 2022-11-20 NOTE — ED Notes (Signed)
Patient given breakfast tray.

## 2022-11-20 NOTE — ED Provider Notes (Signed)
Emergency Medicine Observation Re-evaluation Note  Crystal [name] is a 32 y.o. female, seen on rounds today.  Pt initially presented to the ED for complaints of Back Pain, suicidal ideation, and Homicidal  Currently, the patient is calm, no acute complaints.  Physical Exam  Blood pressure 109/74, pulse 67, temperature (!) 97.5 F (36.4 C), temperature source Oral, resp. rate 19, height '5\' 6"'$  (1.676 m), weight 68 kg, SpO2 98 %. Physical Exam General: NAD Lungs: CTAB Psych: not agitated  ED Course / MDM  EKG:    I have reviewed the labs performed to date as well as medications administered while in observation.  Recent changes in the last 24 hours include no acute events overnight.    Plan  Current plan is for TOC placement. Patient is not under full IVC at this time.   Carrie Mew, MD 11/20/22 [phone]

## 2022-11-20 NOTE — ED Notes (Signed)
Vol /pending TOC placement

## 2022-11-20 NOTE — ED Notes (Signed)
Hospital meal provided, pt tolerated w/o complaints.  Waste discarded appropriately.  

## 2022-11-21 NOTE — ED Provider Notes (Signed)
Emergency Medicine Observation Re-evaluation Note  Crystal [name] is a 32 y.o. female, seen on rounds today.  Pt initially presented to the ED for complaints of Back Pain, suicidal ideation, and Homicidal Currently, the patient is sleeping.  Physical Exam  BP 119/75 (BP Location: Right Arm)   Pulse 62   Temp 97.7 F (36.5 C) (Oral)   Resp 16   Ht '5\' 6"'$  (1.676 m)   Wt 68 kg   SpO2 100%   BMI 24.21 kg/m  Physical Exam General: NAD  ED Course / MDM  EKG:   I have reviewed the labs performed to date as well as medications administered while in observation.  Recent changes in the last 24 hours include none.  Plan  Current plan is for TOC placement.    Arta Silence, MD 11/21/22 [phone]

## 2022-11-21 NOTE — ED Notes (Signed)
VOL/pending placement 

## 2022-11-21 NOTE — ED Notes (Addendum)
This tech obtained vital signs on pt. Pt also received snack and drink. No other needs at this time.

## 2022-11-22 MED ORDER — LORAZEPAM 1 MG PO TABS
1.0000 mg | ORAL_TABLET | Freq: Four times a day (QID) | ORAL | Status: DC | PRN
  Administered 2022-11-27 – 2023-03-31 (×25): 1 mg via ORAL
  Filled 2022-11-22 (×26): qty 1

## 2022-11-22 MED ORDER — LORAZEPAM 1 MG PO TABS
1.0000 mg | ORAL_TABLET | Freq: Once | ORAL | Status: AC
  Administered 2022-11-22: 1 mg via ORAL
  Filled 2022-11-22: qty 1

## 2022-11-22 NOTE — ED Notes (Signed)
Patient has received shower supplies and is currently in the shower.

## 2022-11-22 NOTE — ED Notes (Signed)
Patient provided with snack and drink.

## 2022-11-22 NOTE — ED Notes (Signed)
Meal tray given to pt.  

## 2022-11-22 NOTE — ED Provider Notes (Addendum)
Emergency Medicine Observation Re-evaluation Note  Crystal [name] is a 32 y.o. female, seen on rounds today.  Pt initially presented to the ED for complaints of Back Pain, suicidal ideation, and Homicidal  Currently, the patient is is no acute distress. Denies any concerns at this time.  Sleep overnight  Physical Exam  Blood pressure 115/81, pulse 88, temperature 97.8 F (36.6 C), temperature source Oral, resp. rate 18, height '5\' 6"'$  (1.676 m), weight 68 kg, SpO2 96 %.  Physical Exam: General: No apparent distress Pulm: Normal WOB Neuro: Moving all extremities Psych: Resting comfortably     ED Course / MDM     I have reviewed the labs performed to date as well as medications administered while in observation.  Recent changes in the last 24 hours include: No acute events overnight.  12:54 PM Patient agitated, asking for something for anxiety and rocking back and forth.  Given p.o. Ativan.  Scheduled as needed Ativan 1 mg every 6 hours for agitation and anxiety.   Plan   Current plan: Patient awaiting social work disposition/placement    Nathaniel Man, MD 11/22/22 [id number]    Nathaniel Man, MD 11/22/22 [id number]

## 2022-11-22 NOTE — ED Notes (Signed)
Talked to pt in her room about if she told another pt, AA, that she would break his arm. Pt said that she did say that because AA was asking her to talk to another pt and she feels like she is in between both. Pt said that she is not going to harm pt. Pt asked to stay in her room to let things calm down.

## 2022-11-22 NOTE — ED Notes (Signed)
Patient received breakfast tray and juice and milk.

## 2022-11-22 NOTE — ED Notes (Signed)
Patient provided lunch tray.

## 2022-11-22 NOTE — ED Notes (Signed)
Patient received snack.

## 2022-11-22 NOTE — ED Notes (Signed)
Patient is vol pending placement 

## 2022-11-23 NOTE — ED Notes (Signed)
Lunch tray and water provided

## 2022-11-23 NOTE — ED Notes (Signed)
Pt taking shower. Pt was given hygiene items and the following, 1 clean top, 1 clean bottom, with 1 pair of disposable underwear.  Pt changed out into clean clothing.  Staff disposed of all shower supplies.

## 2022-11-23 NOTE — ED Notes (Signed)
Snack was given to patient.

## 2022-11-23 NOTE — ED Notes (Signed)
VOL  TOC  PLACEMENT

## 2022-11-23 NOTE — Progress Notes (Signed)
This Chaplain visited with Pt on routine rounding. Pt feeling sad  that she is not allowed to use/have crayon due to a misbehavior in the unit. She thinks it not fair for her to be disallowed since "according to her  she never mis behaved" She would like to pray that God will help her respond well to mean people.   11/23/22 1600  Spiritual Encounters  Type of Visit Initial  Care provided to: Patient  Conversation partners present during encounter Nurse  Referral source Patient request  Reason for visit Routine spiritual support  OnCall Visit No  Interventions  Spiritual Care Interventions Made Reflective listening;Mindfulness intervention  Intervention Outcomes  Outcomes Connection to spiritual care;Connection to values and goals of care;Awareness of support  Spiritual Care Plan  Spiritual Care Issues Still Outstanding No further spiritual care needs at this time (see row info)

## 2022-11-23 NOTE — ED Notes (Signed)
Hospital meal provided, pt tolerated w/o complaints.  Waste discarded appropriately.  

## 2022-11-23 NOTE — ED Notes (Signed)
Snack and water provided

## 2022-11-23 NOTE — ED Notes (Signed)
Snack and beverage provided

## 2022-11-23 NOTE — ED Notes (Signed)
Pt given dinner tray and soda

## 2022-11-24 NOTE — ED Notes (Signed)
Dinner tray given

## 2022-11-24 NOTE — ED Provider Notes (Signed)
Emergency Medicine Observation Re-evaluation Note  Physical Exam   BP 123/82 (BP Location: Right Arm)   Pulse 82   Temp 98 F (36.7 C) (Oral)   Resp 18   Ht '5\' 6"'$  (1.676 m)   Wt 68 kg   SpO2 97%   BMI 24.21 kg/m   Patient resting, unlabored breathing, no acute distress.  ED Course / MDM   No reported events during my shift at the time of this note.   Pt is awaiting dispo from SW   Lucillie Garfinkel MD    Lucillie Garfinkel, MD 11/24/22 [phone]

## 2022-11-24 NOTE — Progress Notes (Signed)
   11/23/22 1600  Spiritual Encounters  Type of Visit Initial  Care provided to: Patient  Conversation partners present during encounter Nurse  Referral source Patient request  Reason for visit Routine spiritual support  OnCall Visit No  Interventions  Spiritual Care Interventions Made Reflective listening;Mindfulness intervention  Intervention Outcomes  Outcomes Connection to spiritual care;Connection to values and goals of care;Awareness of support  Spiritual Care Plan  Spiritual Care Issues Still Outstanding No further spiritual care needs at this time (see row info)

## 2022-11-24 NOTE — ED Notes (Signed)
vol/toc placement.Marland Kitchen

## 2022-11-24 NOTE — Progress Notes (Signed)
   11/24/22 1600  Spiritual Encounters  Type of Visit Initial  Care provided to: Patient  Conversation partners present during encounter Nurse  Referral source Patient request  Reason for visit Routine spiritual support  OnCall Visit No  Spiritual Framework  Presenting Themes Coping tools  Interventions  Spiritual Care Interventions Made  (Presented some song sheets to Pt as requested earlier)  Intervention Outcomes  Outcomes Connection to spiritual care  Spiritual Care Plan  Spiritual Care Issues Still Outstanding No further spiritual care needs at this time (see row info)

## 2022-11-24 NOTE — ED Notes (Signed)
Snack given.

## 2022-11-24 NOTE — ED Notes (Signed)
VOL/TOC placement

## 2022-11-25 NOTE — ED Notes (Signed)
Patient is calm and cooperative, stands at door and ask for several things at a time, Nurse let her know that she can only have one soda a day, and no phone calls, and that she can't go into other patient's rooms. Patient's voices understanding. Staff will continue to monitor for safety.

## 2022-11-25 NOTE — ED Notes (Signed)
VOL  TOC  PLACEMENT

## 2022-11-25 NOTE — ED Notes (Signed)
VOL/TOC Placement

## 2022-11-25 NOTE — TOC Progression Note (Signed)
Transition of Care [facility]) - Progression Note    Patient Details  Name: Crystal [name] MRN: [id number] Date of Birth: 1991-03-01  Transition of Care [facility]) CM/SW Contact  Cecil Cobbs Phone Number: 11/25/2022, 10:32 AM  Clinical Narrative:     TOC received an email from [employer]  Their physician would like to speak to one of the hospital physicians following patient to discuss some possible medication changes to help treat her HIV.  Patient is followed by ID physician Dr. Delaine Lame outpatient.  TOC updated attending physician, and he provided the contact number for [facility] to call and discuss patient.  TOC sent email back to [facility] and provided contact information for their physician to reach out to the hospital physicians.   Expected Discharge Plan: Group Home Barriers to Discharge:  (pending group home acceptance back)  Expected Discharge Plan and Services  New group home                                             Social Determinants of Health (SDOH) Interventions SDOH Screenings   Alcohol Screen: Low Risk  (11/11/2020)  Depression (PHQ2-9): Low Risk  (03/17/2022)  Tobacco Use: Medium Risk (11/14/2022)    Readmission Risk Interventions     No data to display

## 2022-11-25 NOTE — ED Notes (Signed)
Patient sitting in the dayroom, no signs of distress.

## 2022-11-25 NOTE — ED Provider Notes (Signed)
Emergency Medicine Observation Re-evaluation Note  Crystal [name] is a 32 y.o. female, seen on rounds today.  Pt initially presented to the ED for complaints of Back Pain, suicidal ideation, and Homicidal   Physical Exam  BP 114/70 (BP Location: Right Arm)   Pulse 88   Temp 97.9 F (36.6 C) (Oral)   Resp 18   Ht '5\' 6"'$  (1.676 m)   Wt 68 kg   SpO2 99%   BMI 24.21 kg/m  Physical Exam General: NAD  ED Course / MDM  EKG:   I have reviewed the labs performed to date as well as medications administered while in observation.  Recent changes in the last 24 hours include none.  Plan  Current plan is for SW.    Merlyn Lot, MD 11/25/22 [phone]

## 2022-11-25 NOTE — ED Notes (Signed)
Hospital meal provided, pt tolerated w/o complaints.  Waste discarded appropriately.  

## 2022-11-26 NOTE — TOC Progression Note (Signed)
Transition of Care [facility]) - Progression Note    Patient Details  Name: Crystal [name] MRN: [id number] Date of Birth: 1991-07-25  Transition of Care [facility]) CM/SW Contact  Ross Ludwig, [place] Phone Number: 11/20/2022 2:44pm  Clinical Narrative:     TOC continuing to follow patient's progress throughout discharge planning.  No new updates regarding placement for patient.  Expected Discharge Plan: Group Home Barriers to Discharge:  (pending group home acceptance back)  Expected Discharge Plan and Services                                               Social Determinants of Health (SDOH) Interventions SDOH Screenings   Alcohol Screen: Low Risk  (11/11/2020)  Depression (PHQ2-9): Low Risk  (03/17/2022)  Tobacco Use: Medium Risk (11/14/2022)    Readmission Risk Interventions     No data to display

## 2022-11-26 NOTE — ED Notes (Signed)
Snack was provided for patient.

## 2022-11-26 NOTE — ED Notes (Signed)
Vol /pending TOC placement

## 2022-11-26 NOTE — ED Notes (Signed)
Hospital meal provided, pt tolerated w/o complaints.  Waste discarded appropriately.  

## 2022-11-26 NOTE — ED Provider Notes (Signed)
Emergency Medicine Observation Re-evaluation Note  Physical Exam   BP 117/70 (BP Location: Right Arm)   Pulse 74   Temp 98.5 F (36.9 C) (Oral)   Resp 18   Ht '5\' 6"'$  (1.676 m)   Wt 68 kg   SpO2 98%   BMI 24.21 kg/m   Patient resting, unlabored breathing, no acute distress.  ED Course / MDM   No reported events during my shift at the time of this note.   Pt is awaiting dispo from SW   Lucillie Garfinkel MD    Lucillie Garfinkel, MD 11/26/22 [phone]

## 2022-11-26 NOTE — ED Notes (Signed)
Patient was given fresh linens.

## 2022-11-26 NOTE — ED Notes (Signed)
Pt taking shower. Pt was given hygiene items and the following, 1 clean top, 1 clean bottom, with 1 pair of disposable underwear.  Pt changed out into clean clothing.  Staff disposed of all shower supplies.

## 2022-11-26 NOTE — TOC Progression Note (Signed)
Transition of Care [facility]) - Progression Note    Patient Details  Name: Crystal [name] MRN: [id number] Date of Birth: 12/24/1990  Transition of Care [facility]) CM/SW Contact  Ross Ludwig, [place] Phone Number: 11/24/2022 2:42pm  Clinical Narrative:     CSW received email from [place] [facility] [employer] who is patient's legal guardian, they are requesting to speak to physician about possible med changes.  CSW provided [facility] with contact information for physician.  Expected Discharge Plan: Group Home Barriers to Discharge:  (pending group home acceptance back)  Expected Discharge Plan and Services                                               Social Determinants of Health (SDOH) Interventions SDOH Screenings   Alcohol Screen: Low Risk  (11/11/2020)  Depression (PHQ2-9): Low Risk  (03/17/2022)  Tobacco Use: Medium Risk (11/14/2022)    Readmission Risk Interventions     No data to display

## 2022-11-26 NOTE — ED Notes (Signed)
Vol  Toc  placement

## 2022-11-27 NOTE — ED Notes (Signed)
VOL TOC placement 

## 2022-11-27 NOTE — ED Notes (Signed)
VOL/Pending TOC Placement 

## 2022-11-27 NOTE — ED Notes (Signed)
Pt given lunch

## 2022-11-27 NOTE — ED Notes (Signed)
Pt given dinner  

## 2022-11-27 NOTE — ED Notes (Signed)
Snack provided with water

## 2022-11-27 NOTE — ED Provider Notes (Signed)
Emergency Medicine Observation Re-evaluation Note  Crystal [name] is a 31 y.o. female, seen on rounds today.  Seen in the emergency department for psychiatric complaint.  No acute events since last update  Physical Exam  BP 101/71 (BP Location: Right Arm)   Pulse 98   Temp 98.4 F (36.9 C) (Oral)   Resp 20   Ht '5\' 6"'$  (1.676 m)   Wt 68 kg   SpO2 92%   BMI 24.21 kg/m   ED Course / MDM   No recent lab work available for review.  Plan  Current plan is for placement to an appropriate living facility once available.  Social work is currently working with this patient.    Harvest Dark, MD 11/27/22 [phone]

## 2022-11-27 NOTE — ED Notes (Signed)
Pt given nighttime snack. 

## 2022-11-28 MED ORDER — POLYETHYLENE GLYCOL 3350 17 G PO PACK
17.0000 g | PACK | Freq: Every day | ORAL | Status: DC | PRN
  Administered 2022-11-28 – 2023-02-24 (×5): 17 g via ORAL
  Filled 2022-11-28 (×6): qty 1

## 2022-11-28 NOTE — ED Notes (Signed)
Pt given nighttime snack. 

## 2022-11-28 NOTE — ED Notes (Signed)
Pt given dinner tray.

## 2022-11-28 NOTE — ED Provider Notes (Signed)
Emergency Medicine Observation Re-evaluation Note  Physical Exam   BP 110/78 (BP Location: Left Arm)   Pulse 76   Temp 97.9 F (36.6 C) (Oral)   Resp 18   Ht '5\' 6"'$  (1.676 m)   Wt 68 kg   SpO2 100%   BMI 24.21 kg/m   Patient resting, unlabored breathing, no acute distress.  ED Course / MDM   No reported events during my shift at the time of this note.   Pt is awaiting dispo from SW   Lucillie Garfinkel MD    Lucillie Garfinkel, MD 11/28/22 [phone]

## 2022-11-28 NOTE — ED Notes (Signed)
Pt taking shower at this time

## 2022-11-28 NOTE — Progress Notes (Signed)
   11/28/22 1500  Spiritual Encounters  Type of Visit Initial  Care provided to: Patient  Conversation partners present during encounter Other (comment)  Referral source Patient request  Reason for visit Routine spiritual support  OnCall Visit Yes   Patient requested visit while Chaplain was visiting other patients. Patient wanted to tell her story and request prayer.

## 2022-11-28 NOTE — ED Notes (Signed)
Pt given snack at this time  

## 2022-11-28 NOTE — ED Notes (Signed)
Pt informed this tech that she had thrown up. Brown emesis noted in toilet. Will make RN aware

## 2022-11-28 NOTE — ED Notes (Signed)
Pt given lunch

## 2022-11-28 NOTE — ED Notes (Signed)
RN made MD aware pt had one occurrence of emesis. Pt denies any other symptoms and when asked to go to room if she is feeling unwell pt stated she feels find and just needs some graham crackers.

## 2022-11-29 NOTE — ED Notes (Signed)
Pt given snack and water

## 2022-11-29 NOTE — ED Notes (Signed)
VOL  PENDING  TOC  PLACEMENT 

## 2022-11-29 NOTE — ED Notes (Signed)
Pt given breakfast tray and juice. 

## 2022-11-29 NOTE — ED Notes (Signed)
Pt given shower supplies. Pt able to shower independently.

## 2022-11-29 NOTE — ED Notes (Signed)
Pt given dinner tray and soda  

## 2022-11-29 NOTE — ED Notes (Signed)
Patient is vol pending TOC placement 

## 2022-11-29 NOTE — ED Notes (Signed)
Pt given lunch tray and water  

## 2022-11-30 NOTE — ED Notes (Signed)
Pt given lunch tray with soda. No other needs at this time.

## 2022-11-30 NOTE — ED Notes (Signed)
Pt. Given snack and water

## 2022-11-30 NOTE — ED Notes (Signed)
This tech obtained vital signs on pt.  

## 2022-11-30 NOTE — ED Notes (Signed)
Pt. Given supplies for shower. Pt. Is able to take shower independently.

## 2022-11-30 NOTE — ED Notes (Signed)
Pt. was given breakfast tray and milk

## 2022-11-30 NOTE — ED Notes (Signed)
Pt given snack and drink. No other needs at this moment.

## 2022-11-30 NOTE — Progress Notes (Signed)
Responding to a Spiritual care Consult, Crystal [name] visited the Pt at [facility]. Pt expressed need to have religious literature to assist her in cooping. Melven Sartorius will share some Hymns as requested.   11/30/22 1000  Spiritual Encounters  Type of Visit Initial  Care provided to: Patient  Conversation partners present during encounter Nurse  Referral source Patient request  Reason for visit Routine spiritual support  OnCall Visit No  Spiritual Framework  Presenting Themes Coping tools  Interventions  Spiritual Care Interventions Made Decision-making support/facilitation  Intervention Outcomes  Outcomes Awareness of support  Spiritual Care Plan  Spiritual Care Issues Still Outstanding No further spiritual care needs at this time (see row info)

## 2022-11-30 NOTE — ED Notes (Signed)
Pt. given fresh linens for bed

## 2022-11-30 NOTE — ED Provider Notes (Signed)
Emergency Medicine Observation Re-evaluation Note  Crystal [name] is a 32 y.o. female, seen on rounds today.  Pt initially presented to the ED for complaints of Back Pain, suicidal ideation, and Homicidal Currently, the patient is sitting in day room, denies complaints.  Physical Exam  BP 110/75 (BP Location: Right Arm)   Pulse 89   Temp 98.2 F (36.8 C) (Oral)   Resp 16   Ht '5\' 6"'$  (1.676 m)   Wt 68 kg   SpO2 97%   BMI 24.21 kg/m  Physical Exam Constitutional: Resting comfortably. Eyes: Conjunctivae are normal. Head: Atraumatic. Nose: No congestion/rhinnorhea. Mouth/Throat: Mucous membranes are moist. Neck: Normal ROM Cardiovascular: No cyanosis noted. Respiratory: Normal respiratory effort. Gastrointestinal: Non-distended. Genitourinary: deferred Musculoskeletal: No lower extremity tenderness nor edema. Neurologic:  Normal speech and language. No gross focal neurologic deficits are appreciated. Skin:  Skin is warm, dry and intact. No rash noted.   ED Course / MDM  EKG:   I have reviewed the labs performed to date as well as medications administered while in observation.  Recent changes in the last 24 hours include none.  Plan  Current plan is for dispo per social work.    Blake Divine, MD 11/30/22 [phone]

## 2022-11-30 NOTE — ED Notes (Signed)
Hospital meal provided, pt tolerated w/o complaints.  Waste discarded appropriately.  

## 2022-11-30 NOTE — TOC Progression Note (Addendum)
Transition of Care [facility]) - Progression Note    Patient Details  Name: Demetric Quick MRN: [id number] Date of Birth: 16-Apr-1991  [facility]) CM/SW Contact  Ross Ludwig, [place] Phone Number: 11/30/2022, 5:54 PM  Clinical Narrative:    TOC received an email from [place] at [facility] [phone] requesting updated FL2 to be completed and emailed back to them to continue looking for placement for a new group home for patient.  Awaiting for options for patient.   Expected Discharge Plan: Group Home Barriers to Discharge:  (pending group home acceptance back)  Expected Discharge Plan and Services    Group home placement.                                           Social Determinants of Health (SDOH) Interventions SDOH Screenings   Alcohol Screen: Low Risk  (11/11/2020)  Depression (PHQ2-9): Low Risk  (03/17/2022)  Tobacco Use: Medium Risk (11/14/2022)    Readmission Risk Interventions     No data to display

## 2022-12-01 NOTE — ED Notes (Signed)
Pt given nighttime snack. 

## 2022-12-01 NOTE — ED Notes (Signed)
Pt was talking with security and stated "I feel like pouring water on someone." Security guard told the pt that wasn't nice, and pt apologized.

## 2022-12-01 NOTE — ED Notes (Signed)
Vol / pending TOC placement 

## 2022-12-01 NOTE — ED Notes (Signed)
Pt came to the door of the nurse's station and stated that "my anxiety is going to make her do something and the only person that can talk me down in my guardian." Guardian is not reachable because this RN spoke to the guardian earlier today. Pt was informed of this earlier when given her ativan medicine.

## 2022-12-01 NOTE — ED Provider Notes (Signed)
Emergency Medicine Observation Re-evaluation Note  Crystal [name] is a 32 y.o. female, seen on rounds today.  Pt initially presented to the ED for complaints of Back Pain, suicidal ideation, and Homicidal Currently, the patient is awaiting placement.  Physical Exam  BP 109/70 (BP Location: Right Arm)   Pulse 85   Temp 98.1 F (36.7 C)   Resp 18   Ht '5\' 6"'$  (1.676 m)   Wt 68 kg   SpO2 100%   BMI 24.21 kg/m  Physical Exam General: calm  ED Course / MDM  No new lab results in past 24 hours  Plan  Current plan is for placement.    Nance Pear, MD 12/01/22 [phone]

## 2022-12-01 NOTE — Progress Notes (Signed)
   12/01/22 1600  Spiritual Encounters  Type of Visit Initial  Care provided to: Patient  Conversation partners present during encounter Nurse  Referral source Patient request  Reason for visit Routine spiritual support  OnCall Visit No  Spiritual Framework  Presenting Themes Coping tools;Goals in life/care  Patient Stress Factors None identified  Interventions  Spiritual Care Interventions Made Established relationship of care and support;Compassionate presence;Reflective listening;Encouragement  Intervention Outcomes  Outcomes Awareness around self/spiritual resourses  Spiritual Care Plan  Spiritual Care Issues Still Outstanding No further spiritual care needs at this time (see row info)   Spoke with [facility] concerning her desire to be place in a group home. Advise patient that she can not self destruct and keep herself in here by misbehaving. Patient has a want for coloring pages and puzzles to help relieve her stress. Advise patients I see what I am able to do in that situation.

## 2022-12-01 NOTE — ED Notes (Signed)
Called pt's guardian to let her know about the list that pt made

## 2022-12-02 NOTE — ED Notes (Signed)
VOL  TOC  PLACEMENT 

## 2022-12-02 NOTE — ED Notes (Signed)
Snacks provided to pt

## 2022-12-02 NOTE — ED Notes (Signed)
Patient is alert and oriented, she keeps talking about going to a mental hospital that has a locked unit that she can stay in and wants nurse to let caseworker know that she is excited about staying in a locked unit. Patient will laugh loud and inappropriately. Patient is sitting in the dayroom, nurse will continue to monitor.

## 2022-12-02 NOTE — ED Notes (Addendum)
Patient with supervised visit with Mrs Truddie Coco (social worker with [facility])her # [phone], and after hours number is ; [phone], Patient remained calm and cooperative, no behavioral issues noted. There are no new updates at this time, still looking for placement, Patient will need locked unit for safety due to she will keep trying to escape from group homes.

## 2022-12-02 NOTE — ED Notes (Signed)
Dinner tray provided

## 2022-12-02 NOTE — ED Provider Notes (Signed)
Emergency Medicine Observation Re-evaluation Note  Physical Exam   BP 120/79 (BP Location: Right Arm)   Pulse 74   Temp 98.2 F (36.8 C) (Oral)   Resp 17   Ht '5\' 6"'$  (1.676 m)   Wt 68 kg   SpO2 100%   BMI 24.21 kg/m   Patient appears in no acute distress.  ED Course / MDM   No reported events during my shift at the time of this note.   Pt is awaiting dispo from SW   Lucillie Garfinkel MD    Lucillie Garfinkel, MD 12/02/22 [phone]

## 2022-12-02 NOTE — ED Notes (Signed)
Pt to door asking for towels, because she vomited all over her floor.  Pt had vomit on he shirt.  Pt given new sheets, pillow case, blankets and scrub top.

## 2022-12-02 NOTE — TOC Progression Note (Signed)
Transition of Care [facility]) - Progression Note    Patient Details  Name: Crystal [name] MRN: [id number] Date of Birth: 07/13/1991  [facility]) CM/SW Contact  Ross Ludwig, [place] Phone Number: 12/02/2022, 4:59 PM  Clinical Narrative:     Updated signed state FL2 completed and emailed to Joylene Grapes at [facility] who is patient's legal guardian and working on placement for patient.  TOC DTP team continuing to follow patient's progress throughout discharge planning.   Expected Discharge Plan: Group Home Barriers to Discharge:  (pending group home acceptance back)  Expected Discharge Plan and Services                                               Social Determinants of Health (SDOH) Interventions SDOH Screenings   Alcohol Screen: Low Risk  (11/11/2020)  Depression (PHQ2-9): Low Risk  (03/17/2022)  Tobacco Use: Medium Risk (11/14/2022)    Readmission Risk Interventions     No data to display

## 2022-12-02 NOTE — ED Notes (Signed)
vol/toc placement.. 

## 2022-12-02 NOTE — ED Notes (Signed)
Patient is having anxiety, and states that she feels afraid of the Patient in room 1, she states " I can tell He is angry" Nurse reassured her that she was safe, but patient wanted medication for anxiety. Nurse did administer , staff will continue to monitor.

## 2022-12-02 NOTE — NC FL2 (Signed)
[place] LEVEL OF CARE FORM     IDENTIFICATION  Patient Name: Crystal [name] Birthdate: 1991-02-28 Sex: female Admission Date (Current Location): 11/14/2022  [place] and [place] Number:  Crystal [name] [id number] [place] and Address:  [facility], [address] [place], [place] [address]      Provider Number: [id number]  Attending Physician Name and Address:  No att. 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    Orientation RESPIRATION BLADDER Height & Weight     Self, Time, Situation, Place  Normal Continent Weight: 150 lb (68 kg) Height:  '5\' 6"'$  (167.6 cm)  BEHAVIORAL SYMPTOMS/MOOD NEUROLOGICAL BOWEL NUTRITION STATUS      Continent Diet  AMBULATORY STATUS COMMUNICATION OF NEEDS Skin   Independent Verbally Normal                       Personal Care Assistance Level of Assistance  Bathing, Dressing, Feeding Bathing Assistance: Independent Feeding assistance: Independent Dressing Assistance: Independent     Functional Limitations Info  Sight, Hearing, Speech Sight Info: Adequate Hearing Info: Adequate Speech Info: Adequate    SPECIAL CARE FACTORS FREQUENCY                       Contractures Contractures Info: Not present    Additional Factors Info  Code  Status, Allergies, Psychotropic Code Status Info: Full Code Allergies Info: NKA Psychotropic Info: busPIRone (BUSPAR) tablet 30 mg, 2x a day PO, divalproex (DEPAKOTE) DR tablet 125 mg  Freq: Every 12 hours Route: PO, risperiDONE (RISPERDAL) tablet Dose: 1 mg  Freq: 2 times daily Route: PO, sertraline (ZOLOFT) tablet  Dose: 200 mg  Freq: Daily Route: PO         Current Medications (12/02/2022):  This is the current hospital active medication list Current Facility-Administered Medications  Medication Dose Route Frequency Provider Last Rate Last Admin   alum & mag hydroxide-simeth (MAALOX/MYLANTA) [id number] MG/5ML suspension 30 mL  30 mL Oral Q6H PRN Carrie Mew, MD       busPIRone (BUSPAR) tablet 30 mg  30 mg Oral BID Patrecia Pour, NP   30 mg at 12/02/22 1012   divalproex (DEPAKOTE) DR tablet 125 mg  125 mg Oral Q12H Patrecia Pour, NP   125 mg at 12/02/22 1013   dolutegravir (TIVICAY) tablet 50 mg  50 mg Oral Daily Mumma, Larene Beach, MD   50 mg at 12/02/22 1012   And   lamiVUDine (EPIVIR) tablet 300 mg  300 mg Oral Daily Mumma, Larene Beach, MD   300 mg at 12/02/22 1013   gabapentin (NEURONTIN) capsule 100 mg  100 mg Oral TID Patrecia Pour, NP   100 mg at 12/02/22 1514   hydrOXYzine (ATARAX) tablet 100 mg  100 mg Oral QHS Patrecia Pour, NP   100 mg at 12/01/22  2125   ibuprofen (ADVIL) tablet 600 mg  600 mg Oral Q8H PRN Carrie Mew, MD   600 mg at 12/02/22 1012   LORazepam (ATIVAN) tablet 1 mg  1 mg Oral Q6H PRN Nathaniel Man, MD   1 mg at 11/27/22 1831   melatonin tablet 5 mg  5 mg Oral QHS Dorothe Pea, RPH   5 mg at 12/01/22 2125   ondansetron (ZOFRAN) tablet 4 mg  4 mg Oral Q8H PRN Carrie Mew, MD   4 mg at 12/02/22 0033   polyethylene glycol (MIRALAX / GLYCOLAX) packet 17 g  17 g Oral Daily PRN Lucillie Garfinkel, MD   17 g at 11/29/22 0913   risperiDONE (RISPERDAL) tablet 1 mg  1 mg Oral BID Patrecia Pour, NP   1 mg at 12/02/22 1012   sertraline (ZOLOFT) tablet 200 mg   200 mg Oral Daily Patrecia Pour, NP   200 mg at 12/02/22 1013   Current Outpatient Medications  Medication Sig Dispense Refill   acetaminophen (TYLENOL) 325 MG tablet Take 650 mg by mouth every 6 (six) hours as needed.     acetaminophen (TYLENOL) 500 MG tablet Take 1 tablet (500 mg total) by mouth every 6 (six) hours as needed. 30 tablet 0   busPIRone (BUSPAR) 30 MG tablet Take 1 tablet (30 mg total) by mouth 2 (two) times daily. 60 tablet 1   Cholecalciferol (VITAMIN D3) 1.25 MG (50000 UT) TABS Take 1 capsule by mouth once a week.     docusate sodium (COLACE) 100 MG capsule Take 1 capsule (100 mg total) by mouth 2 (two) times daily. 60 capsule 1   gabapentin (NEURONTIN) 100 MG capsule Take 1 capsule (100 mg total) by mouth 3 (three) times daily. 90 capsule 0   ibuprofen (ADVIL) 400 MG tablet Take 2 tablets (800 mg total) by mouth every 6 (six) hours as needed for headache. 30 tablet 0   loratadine (CLARITIN) 10 MG tablet Take 1 tablet (10 mg total) by mouth daily as needed for allergies. 30 tablet 0   medroxyPROGESTERone (DEPO-PROVERA) 150 MG/ML injection Inject 150 mg into the muscle every 3 (three) months.     polyethylene glycol (MIRALAX) 17 g packet Take 17 g by mouth daily. 14 each 0   prazosin (MINIPRESS) 2 MG capsule Take 2 mg by mouth at bedtime.     senna (SENOKOT) 8.6 MG TABS tablet Take 1 tablet (8.6 mg total) by mouth daily as needed for mild constipation. 30 tablet 0   sertraline (ZOLOFT) 100 MG tablet Take 2 tablets (200 mg total) by mouth daily. 60 tablet 0   Suvorexant (BELSOMRA) 10 MG TABS Take 1 tablet by mouth at bedtime.     vitamin B-12 (CYANOCOBALAMIN) 1000 MCG tablet Take 1,000 mcg by mouth daily.     ARIPiprazole (ABILIFY) 20 MG tablet Take 1 tablet (20 mg total) by mouth daily. (Patient not taking: Reported on 11/14/2022) 30 tablet 1   atorvastatin (LIPITOR) 20 MG tablet Take 20 mg by mouth daily. (Patient not taking: Reported on 11/14/2022)     dolutegravir-lamiVUDine  (DOVATO) 50-300 MG tablet Take 1 tablet by mouth daily. (Patient not taking: Reported on 11/16/2022) 30 tablet 5   hydrOXYzine (VISTARIL) 100 MG capsule Take 1 capsule (100 mg total) by mouth at bedtime. (Patient not taking: Reported on 04/01/2022) 30 capsule 1   linaclotide (LINZESS) 145 MCG CAPS capsule Take 145 mcg by mouth daily before breakfast. (Patient not taking: Reported on 11/14/2022)  melatonin 3 MG TABS tablet Take 2 tablets (6 mg total) by mouth at bedtime. (Patient not taking: Reported on 04/01/2022) 60 tablet 1   risperiDONE (RISPERDAL) 1 MG tablet Take 1 tablet (1 mg total) by mouth 2 (two) times daily. (Patient not taking: Reported on 11/14/2022) 60 tablet 0     Discharge Medications: Please see discharge summary for a list of discharge medications.  Relevant Imaging Results:  Relevant Lab Results:   Additional Information SSN [id number]  Ross Ludwig, LCSW

## 2022-12-03 NOTE — ED Notes (Signed)
Vol / pending TOC placement 

## 2022-12-03 NOTE — ED Provider Notes (Signed)
Emergency Medicine Observation Re-evaluation Note  Crystal [name] is a 32 y.o. female, seen on rounds today.  Pt initially presented to the ED for complaints of Back Pain, suicidal ideation, and Homicidal Currently, the patient is calm in NAD.  Physical Exam  BP 116/88 (BP Location: Right Arm)   Pulse 79   Temp 98.7 F (37.1 C) (Oral)   Resp 17   Ht '5\' 6"'$  (1.676 m)   Wt 68 kg   SpO2 100%   BMI 24.21 kg/m    ED Course / MDM  EKG:   I have reviewed the labs performed to date as well as medications administered while in observation.  Recent changes in the last 24 hours include none.  Plan  Current plan is for toc dispo.    Duffy Bruce, MD 12/03/22 [phone]

## 2022-12-03 NOTE — ED Notes (Signed)
Dinner tray and beverage provided for pt
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
## 2022-12-03 NOTE — TOC Progression Note (Signed)
Transition of Care [facility]) - Progression Note    Patient Details  Name: Crystal [name] MRN: [id number] Date of Birth: [birth year]  [facility]) CM/SW Contact  Cecil Cobbs Phone Number: 12/03/2022, 6:53 PM  Clinical Narrative:     CSW spoke to [name] the administrator with [facility] he is inquiring about Jalei Stamler and said he had a female bed open.  They are reviewing patient's information.  CSW provided contact phone number for the LTSS care manager Ricardo Jericho at [facility] Management to Sharla Kidney to call her and review patient.  CSW also sent an email to [name] to inform her that [facility] is considering patient and has a female bed available.  TOC to continue to follow patient's progress throughout discharge planning.   Expected Discharge Plan: Group Home Barriers to Discharge:  (pending group home acceptance back)  Expected Discharge Plan and Services                                               Social Determinants of Health (SDOH) Interventions SDOH Screenings   Alcohol Screen: Low Risk  (11/11/2020)  Depression (PHQ2-9): Low Risk  (03/17/2022)  Tobacco Use: Medium Risk (11/14/2022)    Readmission Risk Interventions     No data to display

## 2022-12-04 NOTE — ED Notes (Signed)
Snack given.

## 2022-12-04 NOTE — Progress Notes (Signed)
   12/04/22 1400  Spiritual Encounters  Type of Visit Follow up  Care provided to: Patient  Conversation partners present during encounter Other (comment)  Referral source Patient request  Reason for visit Routine spiritual support  OnCall Visit No  Spiritual Framework  Presenting Themes Coping tools  Patient Stress Factors None identified  Interventions  Spiritual Care Interventions Made Compassionate presence  Intervention Outcomes  Outcomes Awareness of support  Spiritual Care Plan  Spiritual Care Issues Still Outstanding No further spiritual care needs at this time (see row info)   Bring patient coloring book. Patient was happy to receive a puzzle book to do. Patient ask as if we are able to get her some more coloring books. I advise her I will see what I can do next week on Monday.

## 2022-12-04 NOTE — ED Notes (Signed)
Hospital meal provided, pt tolerated w/o complaints.  Waste discarded appropriately.  

## 2022-12-04 NOTE — ED Notes (Signed)
vol/toc placement.. 

## 2022-12-04 NOTE — ED Notes (Signed)
Patient reports feeling anxious. Medication administered per PRN order.

## 2022-12-05 NOTE — ED Notes (Signed)
Pt took shower. Pt sitting in dayroom combing hair. Given small cup of lotion as requested.

## 2022-12-05 NOTE — ED Notes (Signed)
Patient is vol pending TOC placement 

## 2022-12-05 NOTE — ED Notes (Signed)
VOL TOC placement 

## 2022-12-05 NOTE — ED Notes (Signed)
Snack and drink given 

## 2022-12-05 NOTE — ED Provider Notes (Signed)
Emergency Medicine Observation Re-evaluation Note  Physical Exam   BP 105/66 (BP Location: Left Arm)   Pulse 86   Temp 98 F (36.7 C) (Oral)   Resp 18   Ht 5\' 6"  (1.676 m)   Wt 68 kg   SpO2 97%   BMI 24.21 kg/m   Patient appears in no acute distress.  ED Course / MDM   No reported events during my shift at the time of this note.   Pt is awaiting dispo from SW   Lucillie Garfinkel MD    Lucillie Garfinkel, MD 12/05/22 [phone]

## 2022-12-05 NOTE — ED Notes (Signed)
Pt received snack and drink. Pt c/c.

## 2022-12-05 NOTE — ED Notes (Signed)
Pt received snack and water.

## 2022-12-06 NOTE — ED Notes (Signed)
Patient is vol pending TOC placement 

## 2022-12-06 NOTE — ED Notes (Addendum)
Snack and fresh water provided at this time.

## 2022-12-06 NOTE — ED Notes (Signed)
Pt complained of nausea and felt she may vomit. PRN offered and provided

## 2022-12-06 NOTE — ED Notes (Signed)
Hospital dinner tray and sprite provided

## 2022-12-06 NOTE — ED Notes (Signed)
Pt awake now. Breakfast tray and juice provided.

## 2022-12-06 NOTE — ED Provider Notes (Signed)
Emergency Medicine Observation Re-evaluation Note  Physical Exam   BP 114/81 (BP Location: Right Arm)   Pulse 78   Temp 98.5 F (36.9 C) (Oral)   Resp 18   Ht 5\' 6"  (1.676 m)   Wt 68 kg   SpO2 97%   BMI 24.21 kg/m   Patient appears in no acute distress.  ED Course / MDM   No reported events during my shift at the time of this note.   Pt is awaiting dispo from SW   Delman Kitten, MD 12/06/22 1540

## 2022-12-06 NOTE — ED Notes (Signed)
Pt showered and changed paper scrubs. Pt refuses linen change stating she changed bed linens last night.

## 2022-12-06 NOTE — ED Notes (Signed)
Sent med message for depakote from pharmacy due at 1000.

## 2022-12-07 MED ORDER — DROPERIDOL 2.5 MG/ML IJ SOLN
2.5000 mg | Freq: Once | INTRAMUSCULAR | Status: AC | PRN
  Administered 2022-12-07: 2.5 mg via INTRAMUSCULAR
  Filled 2022-12-07: qty 2

## 2022-12-07 NOTE — ED Notes (Signed)
Pt took handful of stool and put it all over door between staff and patients. Door cleansed and charge RN Sam notified.

## 2022-12-07 NOTE — ED Provider Notes (Signed)
Emergency Medicine Observation Re-evaluation Note  Physical Exam   BP 111/71   Pulse 91   Temp 98.3 F (36.8 C) (Oral)   Resp 18   Ht 5\' 6"  (1.676 m)   Wt 68 kg   SpO2 98%   BMI 24.21 kg/m   Patient appears in no acute distress.  ED Course / MDM   No reported events during my shift at the time of this note.   Pt is awaiting dispo from SW   Lucillie Garfinkel MD    Lucillie Garfinkel, MD 12/07/22 1435

## 2022-12-07 NOTE — ED Notes (Signed)
Pt up to restroom. Pt received lunch tray and drink and back to room.

## 2022-12-07 NOTE — ED Notes (Signed)
This tech obtained vital signs on pt.  

## 2022-12-07 NOTE — ED Notes (Signed)
Pt given a breakfast tray.

## 2022-12-07 NOTE — ED Notes (Signed)
VOL TOC placement 

## 2022-12-07 NOTE — ED Notes (Signed)
Pt took ativan and then threw rest of cup of water on this RN bc she wanted to be moved to [place] and was told that is not going to happen. Pt to be given only water as drink for next 48 hours.

## 2022-12-07 NOTE — ED Notes (Signed)
VOL  TOC  PLACEMENT 

## 2022-12-07 NOTE — ED Notes (Signed)
Pt stated to this RN that she is "sorry" and requested something for anxiety; told it will likely be in IM shot form; pt states she will take shot; when asked why she wants to go to [facility] pt states she feels she will be "safer" there in a room on her own; pt denies feeling that any other specific pt is going to harm her; pt reminded that she has her own room and that no patients should be entering each other's rooms and that no other patient has been allowed to enter her room that this RN is aware of; pt reminded staff monitors security cameras and reassured that she is equally as safe in [place] as [place]. EDP Jacelyn Grip notified.
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
## 2022-12-07 NOTE — ED Notes (Signed)
Pt given snack and beverage. 

## 2022-12-07 NOTE — ED Notes (Signed)
All papers and puzzle and left over cups of water removed from pt's room. Pt on restriction until 3/23; not to have anything to drink except water, not to be given paper or crayons, not to be given remote to tv or have tv turned on and no snacks between meals. Charge RN Sam and Mayra NT as well as Neurosurgeon aware of new restrictions. Pt continues to have phone restrictions.

## 2022-12-08 NOTE — ED Notes (Signed)
Pt given lunch tray and water at this time. 

## 2022-12-08 NOTE — ED Notes (Signed)
VOL  TOC  PLACEMENT 

## 2022-12-08 NOTE — ED Notes (Signed)
Pt was given breakfast tray 

## 2022-12-08 NOTE — ED Notes (Signed)
Pt is currently in the shower   

## 2022-12-08 NOTE — ED Provider Notes (Signed)
Emergency Medicine Observation Re-evaluation Note  Crystal [name] is a 32 y.o. female, seen on rounds today.  Pt initially presented to the ED for complaints of Back Pain, suicidal ideation, and Homicidal  Currently, the patient is resting comfortably in bed  Physical Exam  Blood pressure 97/80, pulse 88, temperature 98.7 F (37.1 C), temperature source Oral, resp. rate 18, height 5\' 6"  (1.676 m), weight 68 kg, SpO2 98 %.  Physical Exam: General: No apparent distress Psych: Resting comfortably     ED Course / MDM     I have reviewed the labs performed to date as well as medications administered while in observation.  Recent changes in the last 24 hours include: No acute events overnight.  Plan   Current plan: Patient awaiting disposition from social work    Nathaniel Man, MD 12/08/22 [phone]

## 2022-12-09 NOTE — ED Notes (Signed)
Patient had snack, she is calm and cooperative.

## 2022-12-09 NOTE — ED Provider Notes (Signed)
Emergency Medicine Observation Re-evaluation Note  Crystal [name] is a 32 y.o. female, seen on rounds today.  Pt initially presented to the ED for complaints of Back Pain, suicidal ideation, and Homicidal  Currently, the patient is calm, no acute complaints.  Physical Exam  Blood pressure (!) 147/87, pulse 86, temperature 98.5 F (36.9 C), temperature source Oral, resp. rate 16, height 5\' 6"  (1.676 m), weight 68 kg, SpO2 97 %. Physical Exam General: NAD Lungs: CTAB Psych: not agitated  ED Course / MDM  EKG:    I have reviewed the labs performed to date as well as medications administered while in observation.  Recent changes in the last 24 hours include no acute events overnight.    Plan  Current plan is for TOC placement. Patient is not under full IVC at this time.   Carrie Mew, MD 12/09/22 [phone]

## 2022-12-09 NOTE — ED Notes (Signed)
vol/toc placement.. 

## 2022-12-10 NOTE — ED Notes (Signed)
VOL  TOC  PLACEMENT 

## 2022-12-10 NOTE — ED Provider Notes (Signed)
Emergency Medicine Observation Re-evaluation Note  Crystal [name] is a 32 y.o. female, seen on rounds today.  Pt initially presented to the ED for complaints of Back Pain, suicidal ideation, and Homicidal Currently, the patient is sitting in day room, denies any complaints.  Physical Exam  BP 109/75 (BP Location: Right Arm)   Pulse 81   Temp 98.3 F (36.8 C) (Oral)   Resp 17   Ht 5\' 6"  (1.676 m)   Wt 68 kg   SpO2 96%   BMI 24.21 kg/m  Physical Exam Constitutional: Resting comfortably. Eyes: Conjunctivae are normal. Head: Atraumatic. Nose: No congestion/rhinnorhea. Mouth/Throat: Mucous membranes are moist. Neck: Normal ROM Cardiovascular: No cyanosis noted. Respiratory: Normal respiratory effort. Gastrointestinal: Non-distended. Genitourinary: deferred Musculoskeletal: No lower extremity tenderness nor edema. Neurologic:  Normal speech and language. No gross focal neurologic deficits are appreciated. Skin:  Skin is warm, dry and intact. No rash noted.   ED Course / MDM  EKG:   I have reviewed the labs performed to date as well as medications administered while in observation.  Recent changes in the last 24 hours include no acute events.  Plan  Current plan is for dispo per social work.    Blake Divine, MD 12/10/22 1350

## 2022-12-10 NOTE — ED Notes (Signed)
Hospital snack and fresh ice water provided to pt at this time.

## 2022-12-10 NOTE — ED Notes (Signed)
Snack given.
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
## 2022-12-11 MED ORDER — ACETAMINOPHEN 500 MG PO TABS
1000.0000 mg | ORAL_TABLET | Freq: Four times a day (QID) | ORAL | Status: DC | PRN
  Administered 2022-12-11 – 2023-03-31 (×37): 1000 mg via ORAL
  Filled 2022-12-11 (×37): qty 2

## 2022-12-11 NOTE — ED Notes (Signed)
Dinner tray provided

## 2022-12-11 NOTE — ED Notes (Signed)
Pt given meal tray.

## 2022-12-11 NOTE — ED Notes (Signed)
VOL/TOC Placement 

## 2022-12-11 NOTE — ED Notes (Signed)
Pt given nighttime snack. 

## 2022-12-11 NOTE — TOC Progression Note (Signed)
Transition of Care [facility]) - Progression Note    Patient Details  Name: Crystal [name] MRN: [id number] Date of Birth: 08-Mar-1991  [facility]) CM/SW Contact  Ross Ludwig,  Phone Number: 12/11/2022, 8:10 PM  Clinical Narrative:     TOC still working with patient's legal guardian to find placement.  Expected Discharge Plan: Group Home Barriers to Discharge:  (pending group home acceptance back)  Expected Discharge Plan and Services                                               Social Determinants of Health (SDOH) Interventions SDOH Screenings   Alcohol Screen: Low Risk  (11/11/2020)  Depression (PHQ2-9): Low Risk  (03/17/2022)  Tobacco Use: Medium Risk (11/14/2022)    Readmission Risk Interventions     No data to display

## 2022-12-11 NOTE — ED Notes (Signed)
Pt given snack. 

## 2022-12-11 NOTE — ED Notes (Signed)
Pt taking shower 

## 2022-12-11 NOTE — ED Notes (Signed)
Given snack. 

## 2022-12-12 NOTE — ED Notes (Signed)
Patient is up asking for her coloring books and a crayon, I told her that after breakfast we would see if she could get items back, Patient is cooperative and calm, [place] will continue to monitor.

## 2022-12-12 NOTE — ED Provider Notes (Signed)
Emergency Medicine Observation Re-evaluation Note  Crystal [name] is a 32 y.o. female, seen on rounds today.  Pt initially presented to the ED for complaints of Back Pain, suicidal ideation, and Homicidal   Physical Exam  BP 120/83 (BP Location: Right Arm)   Pulse 73   Temp 97.7 F (36.5 C) (Oral)   Resp 18   Ht 5\' 6"  (1.676 m)   Wt 68 kg   SpO2 100%   BMI 24.21 kg/m  Physical Exam General: no distress, well appearing Lungs: nml wob Psych: calm, no agitation  ED Course / MDM  EKG:   I have reviewed the labs performed to date as well as medications administered while in observation.  Recent changes in the last 24 hours include none.  Plan  Current plan is for SW disposition.    Rada Hay, MD 12/12/22 [phone]

## 2022-12-12 NOTE — ED Notes (Signed)
Patient sitting in the dayroom, no signs of distress, watching tv, interacting well with other Patients.

## 2022-12-12 NOTE — ED Notes (Signed)
Pt sleeping equal breathing chest rise and fall.

## 2022-12-12 NOTE — ED Notes (Signed)
Breakfast tray given to pt 

## 2022-12-12 NOTE — ED Notes (Signed)
Pt up to door asking for her coloring materials and puzzle that she was told would be returned to her today after her restriction was lifted.  Pt advised that she can choose one activity to get started with and would like a puzzle.  Puzzle with the word "Vibrant" given to patient and taken to her room.

## 2022-12-12 NOTE — ED Notes (Signed)
Pt received dinner tray and gingerale.

## 2022-12-12 NOTE — ED Notes (Signed)
Snack provided for pt

## 2022-12-12 NOTE — ED Notes (Signed)
VOL TOC placement 

## 2022-12-12 NOTE — ED Notes (Signed)
Shower supplies given to pt. Pt showering at this time 

## 2022-12-12 NOTE — ED Notes (Signed)
Snack given.

## 2022-12-13 NOTE — ED Notes (Signed)
Patient is vol pending toc placement 

## 2022-12-13 NOTE — ED Notes (Signed)
Pt provided with chips and water for snack.

## 2022-12-13 NOTE — ED Notes (Addendum)
Hospital dinner tray and a foam cup of soda provided to pt.   Meal fully consumed and disposed of properly.

## 2022-12-13 NOTE — ED Notes (Signed)
vol/toc placement.. 

## 2022-12-13 NOTE — ED Provider Notes (Signed)
Emergency Medicine Observation Re-evaluation Note  Crystal [name] is a 32 y.o. female, seen on rounds today.  Pt initially presented to the ED for complaints of Back Pain, suicidal ideation, and Homicidal  Currently, the patient is no acute distress. Pt resting   Physical Exam  Blood pressure 108/72, pulse 67, temperature 99.3 F (37.4 C), resp. rate 18, height 5\' 6"  (1.676 m), weight 68 kg, SpO2 97 %.  Physical Exam General: No apparent distress Pulm: Normal WOB Psych: resting     ED Course / MDM     I have reviewed the labs performed to date as well as medications administered while in observation.  Recent changes in the last 24 hours include none  Plan   Current plan is to continue to wait for SW Patient is not under full IVC at this time.   Vanessa , MD 12/13/22 [phone]

## 2022-12-14 NOTE — ED Notes (Signed)
Report off to [employer]
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
## 2022-12-14 NOTE — ED Notes (Signed)
VOL  TOC  PLACEMENT 

## 2022-12-14 NOTE — ED Notes (Signed)
Pt given nighttime snack. 

## 2022-12-14 NOTE — ED Notes (Signed)
Patient received dinner tray at this time.  

## 2022-12-14 NOTE — ED Provider Notes (Signed)
Emergency Medicine Observation Re-evaluation Note  Crystal [name] is a 32 y.o. female, seen on rounds today.  Pt initially presented to the ED for complaints of Back Pain, suicidal ideation, and Homicidal Currently, the patient is resting in her room.  Physical Exam  BP 107/76 (BP Location: Right Arm)   Pulse 81   Temp 98 F (36.7 C) (Oral)   Resp 14   Ht 5\' 6"  (1.676 m)   Wt 68 kg   SpO2 98%   BMI 24.21 kg/m  Physical Exam General: No distress Lungs: Normal respiratory effort Psych: Calm and cooperative  ED Course / MDM   I have reviewed the labs performed to date as well as medications administered while in observation.  There have been no changes to her status in the last 24 hours.  Plan  Current plan is for social work disposition.    Arta Silence, MD 12/14/22 [phone]

## 2022-12-15 MED ORDER — DOCUSATE SODIUM 100 MG PO CAPS
100.0000 mg | ORAL_CAPSULE | Freq: Every day | ORAL | Status: DC | PRN
  Administered 2022-12-15 – 2023-03-24 (×27): 100 mg via ORAL
  Filled 2022-12-15 (×28): qty 1

## 2022-12-15 NOTE — ED Provider Notes (Signed)
Emergency Medicine Observation Re-evaluation Note  Crystal [name] is a 32 y.o. female, seen on rounds today.  Pt initially presented to the ED for complaints of Back Pain, suicidal ideation, and Homicidal Currently, the patient is calm resting.  Physical Exam  BP 110/79 (BP Location: Left Arm)   Pulse 79   Temp 98.5 F (36.9 C) (Oral)   Resp 18   Ht 5\' 6"  (1.676 m)   Wt 68 kg   SpO2 100%   BMI 24.21 kg/m  Physical Exam General: NAD  ED Course / MDM  EKG:   I have reviewed the labs performed to date as well as medications administered while in observation.  Recent changes in the last 24 hours include none.  Plan  Current plan is for TOC dispo.    Duffy Bruce, MD 12/15/22 [phone]

## 2022-12-15 NOTE — TOC Progression Note (Signed)
Transition of Care [facility]) - Progression Note    Patient Details  Name: Bhumika Pelissier MRN: [id number] Date of Birth: 1991/06/04  [facility]) CM/SW Contact  Ross Ludwig, [place] Phone Number: 12/15/2022, 12:15 PM  Clinical Narrative:     CSW attempted to contact guardian from [facility], and left a message awaiting a call back regarding placement.  CSW also sent an email to Joylene Grapes [phone] from [facility] waiting for a response back.  TOC to continue to follow patient's progress throughout discharge planning.   Expected Discharge Plan: Group Home Barriers to Discharge:  (pending group home acceptance back)  Expected Discharge Plan and Services                                               Social Determinants of Health (SDOH) Interventions SDOH Screenings   Alcohol Screen: Low Risk  (11/11/2020)  Depression (PHQ2-9): Low Risk  (03/17/2022)  Tobacco Use: Medium Risk (11/14/2022)    Readmission Risk Interventions     No data to display

## 2022-12-15 NOTE — ED Notes (Signed)
VOL  TOC  PLACEMENT 

## 2022-12-15 NOTE — Progress Notes (Signed)
Patient ID: Crystal [name], female   DOB: [birth year], 32 y.o.   MRN: [id number] Patient states that she is having hard bowel movements and the Miralax makes her nauseous. Writer ordered Colace PRN.   Sherlon Handing, PMHNP

## 2022-12-15 NOTE — ED Notes (Signed)
Snack and drink given 

## 2022-12-16 LAB — CBG MONITORING, ED: Glucose-Capillary: 113 mg/dL — ABNORMAL HIGH (ref 70–99)

## 2022-12-16 NOTE — ED Notes (Signed)
Patient took medications without difficulty. , no signs of distress.

## 2022-12-16 NOTE — ED Notes (Signed)
Patient is oriented, up eating breakfast, she is calm and cooperative, no behavioral issues noted, Blood sugar was on the Patient is room 1, not for this Patient. Patient states that she will take a shower. Staff will continue to monitor for safety.

## 2022-12-16 NOTE — ED Notes (Signed)
Pt received snack and beverage, no other needs at this moment.

## 2022-12-16 NOTE — ED Notes (Signed)
This tech obtained vital signs on pt.  

## 2022-12-16 NOTE — ED Provider Notes (Signed)
Emergency Medicine Observation Re-evaluation Note  Myan Lambrix is a 32 y.o. female, seen on rounds today.  Pt initially presented to the ED for complaints of Back Pain, suicidal ideation, and Homicidal   Currently sleeping comfortably without distress  Physical Exam  BP 114/81 (BP Location: Right Arm)   Pulse 73   Temp 98.2 F (36.8 C) (Oral)   Resp 17   Ht 5\' 6"  (1.676 m)   Wt 68 kg   SpO2 100%   BMI 24.21 kg/m  Physical Exam General: NAD  ED Course / MDM  EKG:   I have reviewed the labs performed to date as well as medications administered while in observation.  Recent changes in the last 24 hours include none.  Plan  Current plan is for TOC dispo.      Delman Kitten, MD 12/16/22 [phone]

## 2022-12-17 NOTE — ED Notes (Signed)
Pt given dinner tray and beverage  

## 2022-12-17 NOTE — ED Notes (Signed)
Chaplain stopped by to see pt as requested by pt.

## 2022-12-17 NOTE — ED Notes (Signed)
vol/toc placement.. 

## 2022-12-17 NOTE — Progress Notes (Signed)
Chap responded to Spiritual consult. Pt handed over some coloring to pass on to [employer]. Melven Sartorius honored Pt request and will pass them on.   12/17/22 1200  Spiritual Encounters  Type of Visit Initial  Care provided to: Patient  Conversation partners present during encounter Nurse  Referral source Patient request  Reason for visit Routine spiritual support  OnCall Visit Yes  Spiritual Framework  Presenting Themes Community and relationships  Community/Connection Spiritual leader  Interventions  Spiritual Care Interventions Made Other (comment)  Intervention Outcomes  Outcomes Connection to values and goals of care;Connected to spiritual community  Spiritual Care Plan  Spiritual Care Issues Still Outstanding No further spiritual care needs at this time (see row info)

## 2022-12-17 NOTE — ED Notes (Signed)
Pt given PM snack and drink 

## 2022-12-17 NOTE — ED Notes (Signed)
Pt taking shower. Pt was given hygiene items and the following, 1 clean top, 1 clean bottom, with 1 pair of disposable underwear.  Pt changed out into clean clothing.  Staff disposed of all shower supplies.   

## 2022-12-17 NOTE — ED Notes (Signed)
Pt received dinner tray and drink.  

## 2022-12-17 NOTE — ED Notes (Signed)
Snack given to pt.

## 2022-12-17 NOTE — ED Provider Notes (Signed)
Emergency Medicine Observation Re-evaluation Note  Crystal [name] is a 32 y.o. female, seen on rounds today.  Pt initially presented to the ED for complaints of Back Pain, suicidal ideation, and Homicidal   Physical Exam  BP 102/75   Pulse (!) 102   Temp 98.2 F (36.8 C)   Resp 16   Ht 5\' 6"  (1.676 m)   Wt 68 kg   SpO2 97%   BMI 24.21 kg/m  Physical Exam General: NAD  ED Course / MDM  EKG:   I have reviewed the labs performed to date as well as medications administered while in observation.   Plan  Current plan is for SW.    Merlyn Lot, MD 12/17/22 [phone]

## 2022-12-17 NOTE — ED Notes (Signed)
Pt sleeping at this time in room

## 2022-12-17 NOTE — ED Notes (Signed)
Pt given water 

## 2022-12-17 NOTE — ED Notes (Signed)
Patient given lunch tray and water.  

## 2022-12-17 NOTE — ED Notes (Signed)
VOL TOC placement 

## 2022-12-17 NOTE — ED Notes (Signed)
Hospital meal provided, pt tolerated w/o complaints.  Waste discarded appropriately.  

## 2022-12-18 NOTE — TOC Progression Note (Signed)
Transition of Care [facility]) - Progression Note    Patient Details  Name: Crystal [name] MRN: [id number] Date of Birth: January 17, 1991  Transition of Care [facility]) CM/SW Contact  Ross Ludwig, [place] Phone Number: 12/18/2022, 4:10 PM  Clinical Narrative:    CSW received phone call from legal guardian Joylene Grapes [phone].  She said [name] from [place] is reviewing patient, [name], [name], [facility] [place], and [name] from [place] has declined patient.  Per [name] there is a team meeting on April 10th to discuss next steps.  TOC continuing to follow patient's progress throughout discharge planning.    Expected Discharge Plan: Group Home Barriers to Discharge:  (pending group home acceptance back)  Expected Discharge Plan and Services                                               Social Determinants of Health (SDOH) Interventions SDOH Screenings   Alcohol Screen: Low Risk  (11/11/2020)  Depression (PHQ2-9): Low Risk  (03/17/2022)  Tobacco Use: Medium Risk (11/14/2022)    Readmission Risk Interventions     No data to display

## 2022-12-18 NOTE — ED Provider Notes (Signed)
Emergency Medicine Observation Re-evaluation Note  Crystal [name] is a 32 y.o. female, seen on rounds today.  Pt initially presented to the ED for complaints of Back Pain, suicidal ideation, and Homicidal  Currently, the patient is resting-no issues overnight  Physical Exam  Blood pressure 114/74, pulse 80, temperature 98.2 F (36.8 C), temperature source Oral, resp. rate 18, height 5\' 6"  (1.676 m), weight 68 kg, SpO2 98 %.  Physical Exam General: No apparent distress Pulm: Normal WOB Psych: resting     ED Course / MDM     I have reviewed the labs performed to date as well as medications administered while in observation.  Recent changes in the last 24 hours include none   Plan   Current plan is to continue to wait for SW  Patient is not under full IVC at this time.   Vanessa [place], MD 12/18/22 [phone]

## 2022-12-18 NOTE — ED Notes (Signed)
Snacks given 

## 2022-12-18 NOTE — ED Notes (Signed)
vol/toc placement.. 

## 2022-12-18 NOTE — ED Notes (Signed)
Pt expresses anxiety and nervousness, pt red in face and trembling. States she is fearful and in doubt about placement. Fears returning to prior [facility] and hospital not treating her fairly in placement. Pt reassured by this nurse, PRN med order in place to aid pt

## 2022-12-18 NOTE — ED Notes (Signed)
VOL  TOC  PLACEMENT 

## 2022-12-18 NOTE — ED Notes (Signed)
Snack and drink given 

## 2022-12-18 NOTE — ED Notes (Addendum)
Report received from Kim,RN.

## 2022-12-18 NOTE — ED Notes (Signed)
Breakfast meal tray given at this time.  

## 2022-12-18 NOTE — ED Notes (Signed)
Dinner tray provided for pt 

## 2022-12-18 NOTE — ED Notes (Signed)
Pt states she wants to go to [facility]

## 2022-12-18 NOTE — ED Notes (Signed)
Snack given at this time  

## 2022-12-19 NOTE — ED Notes (Signed)
Patient is pleasant, concerned about going back to old group home and states " I don't want to go back there" Nurse encouraged her to think on other things and not worry about where she would be going as of yet because no new updates. Patient is pleasant , cooperative, she took medications without difficulty, staff will continue to monitor.

## 2022-12-19 NOTE — ED Notes (Signed)
VOL TOC placement 

## 2022-12-19 NOTE — ED Notes (Signed)
Patient took shower.

## 2022-12-19 NOTE — ED Provider Notes (Signed)
Emergency Medicine Observation Re-evaluation Note  Crystal [name] is a 32 y.o. female, seen on rounds today.  Pt initially presented to the ED for complaints of Back Pain, suicidal ideation, and Homicidal Currently, the patient is sleeping.  There have been no acute issues per the [place].  Physical Exam  BP 104/75 (BP Location: Right Arm)   Pulse (!) 112   Temp 98.2 F (36.8 C) (Oral)   Resp 20   Ht 5\' 6"  (1.676 m)   Wt 89.9 kg   SpO2 96%   BMI 31.99 kg/m  Physical Exam General: No distress Lungs: Normal respiratory effort Psych: Resting  ED Course / MDM   I have reviewed the labs performed to date as well as medications administered while in observation.  There have been no changes to her status in the last 24 hours.  Plan  Current plan is for social work disposition.    Arta Silence, MD 12/19/22 [phone]

## 2022-12-20 NOTE — ED Notes (Signed)
Pt given shower supplies 

## 2022-12-20 NOTE — ED Notes (Signed)
Ibuprofen given for c/o headache

## 2022-12-20 NOTE — ED Notes (Signed)
Multiple coloring and activity papers printed out for pt.

## 2022-12-20 NOTE — ED Notes (Signed)
Received breakfast. 

## 2022-12-20 NOTE — ED Notes (Signed)
pt recieved snack and drink 

## 2022-12-20 NOTE — ED Notes (Signed)
Hospital meal provided, pt tolerated w/o complaints.  Waste discarded appropriately.  

## 2022-12-20 NOTE — ED Notes (Signed)
Patient is vol pending TOC placement 

## 2022-12-20 NOTE — ED Notes (Signed)
Dinner tray and beverage given to pt  

## 2022-12-21 NOTE — ED Notes (Signed)
Vol   PENDING  PLACEMENT 

## 2022-12-21 NOTE — Progress Notes (Signed)
While rounding in this Unit, [place] visited with Pt who was in the common area. Pt expressed feeling "Helpless" because she has not been placed into a group home yet. Pt asked Melven Sartorius to pray for Gods intervention and patience on her part as she awaits. I honored this request.   12/21/22 1500  Spiritual Encounters  Type of Visit Initial  Care provided to: Patient  Conversation partners present during encounter Nurse  Referral source Patient request  Reason for visit Routine spiritual support  OnCall Visit No  Spiritual Framework  Presenting Themes Coping tools  Community/Connection None  Family Stress Factors Lack of knowledge  Interventions  Spiritual Care Interventions Made Prayer;Compassionate presence  Intervention Outcomes  Outcomes Reduced isolation;Reduced anxiety;Awareness of support  Spiritual Care Plan  Spiritual Care Issues Still Outstanding No further spiritual care needs at this time (see row info)

## 2022-12-21 NOTE — ED Notes (Signed)
Pt given dinner tray and sprite.  

## 2022-12-21 NOTE — ED Notes (Signed)
Vol  pending  TOC  placement 

## 2022-12-21 NOTE — ED Provider Notes (Signed)
Emergency Medicine Observation Re-evaluation Note  Mayetta Jeune is a 32 y.o. female, seen on rounds today.  Pt initially presented to the ED for complaints of Back Pain, suicidal ideation, and Homicidal  Currently, the patient is asleep- no issues overnight  Physical Exam  Blood pressure 111/78, pulse 75, temperature 98 F (36.7 C), temperature source Oral, resp. rate 18, height 5\' 6"  (1.676 m), weight 89.9 kg, SpO2 97 %.  Physical Exam General: No apparent distress Pulm: Normal WOB Psych: resting     ED Course / MDM     I have reviewed the labs performed to date as well as medications administered while in observation.  Recent changes in the last 24 hours include none  Plan   Current plan is to continue to wait for SW Patient is not under full IVC at this time.   Vanessa Byrnedale, MD 12/21/22 [phone]

## 2022-12-21 NOTE — ED Notes (Signed)
This NT gave pt cereal and teddy grahams for snack, along with water to drink.

## 2022-12-22 NOTE — ED Provider Notes (Signed)
Emergency Medicine Observation Re-evaluation Note  Crystal [name] is a 32 y.o. female, seen on rounds today.  Pt initially presented to the ED for complaints of Back Pain, suicidal ideation, and Homicidal Currently, the patient is sleeping comfortably, no concerns from nursing staff overnight.  Physical Exam  BP 102/73 (BP Location: Left Arm)   Pulse 72   Temp 98.2 F (36.8 C) (Oral)   Resp 18   Ht 5\' 6"  (1.676 m)   Wt 89.9 kg   SpO2 98%   BMI 31.99 kg/m  Physical Exam Constitutional: Resting comfortably. Eyes: Conjunctivae are normal. Head: Atraumatic. Nose: No congestion/rhinnorhea. Mouth/Throat: Mucous membranes are moist. Neck: Normal ROM Cardiovascular: No cyanosis noted. Respiratory: Normal respiratory effort. Gastrointestinal: Non-distended. Genitourinary: deferred Musculoskeletal: No lower extremity tenderness nor edema. Neurologic:  Normal speech and language. No gross focal neurologic deficits are appreciated. Skin:  Skin is warm, dry and intact. No rash noted.   ED Course / MDM  EKG:   I have reviewed the labs performed to date as well as medications administered while in observation.  Recent changes in the last 24 hours include none.  Plan  Current plan is for dispo per social work.    Blake Divine, MD 12/22/22 [phone]

## 2022-12-22 NOTE — ED Notes (Signed)
Snack and drink given 

## 2022-12-22 NOTE — ED Notes (Signed)
VOL  TOC  PLACEMENT 

## 2022-12-22 NOTE — ED Notes (Signed)
Dinner tray provided for pt 

## 2022-12-23 NOTE — ED Notes (Signed)
Patient took all of po medications, she is redirectable, but keeps coming to the door asking same questions over and over, she is safe, denies Si/hi or avh, she just wants to go to a good facility and keeps saying now that she wants to go to [facility]. Nurse let her know that is not a long term facility. Staff will continue to monitor.

## 2022-12-23 NOTE — ED Notes (Signed)
Pt ate breakfast and took a shower.

## 2022-12-23 NOTE — ED Provider Notes (Signed)
Emergency Medicine Observation Re-evaluation Note  Physical Exam   BP 105/87 (BP Location: Right Arm)   Pulse 79   Temp 97.9 F (36.6 C) (Oral)   Resp 17   Ht 5\' 6"  (1.676 m)   Wt 89.9 kg   SpO2 98%   BMI 31.99 kg/m   Patient appears in no acute distress.  ED Course / MDM   No reported events during my shift at the time of this note.   Pt is awaiting dispo from SW   Lucillie Garfinkel MD    Lucillie Garfinkel, MD 12/23/22 [phone]

## 2022-12-23 NOTE — ED Notes (Signed)
VOL/pending placement 

## 2022-12-24 NOTE — ED Notes (Signed)
Pt in dayroom playing cards.

## 2022-12-24 NOTE — ED Notes (Signed)
Pt given nighttime snack. 

## 2022-12-24 NOTE — ED Provider Notes (Signed)
Emergency Medicine Observation Re-evaluation Note  Physical Exam   BP 118/71 (BP Location: Left Arm)   Pulse 74   Temp 97.9 F (36.6 C) (Oral)   Resp 16   Ht 5\' 6"  (1.676 m)   Wt 89.9 kg   SpO2 96%   BMI 31.99 kg/m   Patient appears in no acute distress.  ED Course / MDM   No reported events during my shift at the time of this note.   Pt is awaiting dispo from SW   Lucillie Garfinkel MD    Lucillie Garfinkel, MD 12/24/22 [phone]

## 2022-12-24 NOTE — ED Notes (Signed)
VOL  TOC  PLACEMENT 

## 2022-12-24 NOTE — ED Notes (Signed)
Hospital meal provided, pt tolerated w/o complaints.  Waste discarded appropriately.  

## 2022-12-25 NOTE — ED Provider Notes (Signed)
Emergency Medicine Observation Re-evaluation Note  Crystal [name] is a 32 y.o. female, seen on rounds today.  Pt initially presented to the ED for complaints of Back Pain, suicidal ideation, and Homicidal Currently, the patient is awaiting disposition  Physical Exam  BP 103/76 (BP Location: Right Arm)   Pulse 71   Temp 98.2 F (36.8 C) (Oral)   Resp 18   Ht 5\' 6"  (1.676 m)   Wt 89.9 kg   SpO2 98%   BMI 31.99 kg/m  Physical Exam General: Calm  ED Course / MDM   No recent labwork  Plan  Awaiting disposition.    Phineas Semen, MD 12/25/22 [phone]

## 2022-12-25 NOTE — ED Notes (Signed)
vol/toc placement.. 

## 2022-12-25 NOTE — ED Notes (Signed)
Patient sitting in the dayroom, no behavioral issues noted, will continue to monitor.

## 2022-12-25 NOTE — ED Notes (Signed)
VOL TOC placement 

## 2022-12-25 NOTE — ED Notes (Signed)
Snack provided

## 2022-12-25 NOTE — TOC Progression Note (Signed)
Transition of Care [facility]) - Progression Note    Patient Details  Name: Crystal [name] MRN: [id number] Date of Birth: [birth year]  [facility]) CM/SW Contact  Darleene Cleaver, [place] Phone Number: 12/25/2022, 10:03 PM  Clinical Narrative:     [facility] still trying to find placement for patient.  TOC to continue to follow patient's progress throughout discharge planning.  Expected Discharge Plan: Group Home Barriers to Discharge:  (pending group home acceptance back)  Expected Discharge Plan and Services    New group home.                                           Social Determinants of Health (SDOH) Interventions SDOH Screenings   Alcohol Screen: Low Risk  (11/11/2020)  Depression (PHQ2-9): Low Risk  (03/17/2022)  Tobacco Use: Medium Risk (11/14/2022)    Readmission Risk Interventions     No data to display

## 2022-12-25 NOTE — ED Notes (Signed)
Hospital meal provided, pt tolerated w/o complaints.  Waste discarded appropriately.  

## 2022-12-25 NOTE — ED Notes (Signed)
Patient took all po medications without difficulty, no behavioral issues, does ask questions about group home and when she can leave, let her know there are no updates at this time. Staff will continue to monitor for safety.

## 2022-12-26 NOTE — ED Provider Notes (Signed)
-----------------------------------------   10:48 AM on 12/26/2022 -----------------------------------------   Blood pressure 111/77, pulse (!) 105, temperature 98 F (36.7 C), temperature source Oral, resp. rate 16, height 5\' 6"  (1.676 m), weight 89.9 kg, SpO2 95 %.  The patient is calm and cooperative at this time, sitting on her bed in her room during my assessment.  There have been no acute events since the last update.  Awaiting disposition plan from Behavioral Medicine and/or Social Work team(s).    Dionne Bucy, MD 12/26/22 1049

## 2022-12-26 NOTE — ED Notes (Signed)
Pt given lunch tray and sprite  

## 2022-12-26 NOTE — ED Notes (Signed)
Hospital meal provided, pt tolerated w/o complaints.  Waste discarded appropriately.  

## 2022-12-26 NOTE — ED Notes (Signed)
Snack provided

## 2022-12-26 NOTE — ED Notes (Signed)
Pt taking shower. Pt was given hygiene items and the following, 1 clean top, 1 clean bottom, with 1 pair of disposable underwear.  Pt changed out into clean clothing.  Staff disposed of all shower supplies.   

## 2022-12-26 NOTE — ED Notes (Signed)
Dinner tray provided

## 2022-12-26 NOTE — ED Notes (Signed)
Pt given snack - eating in room at this time.

## 2022-12-27 NOTE — ED Notes (Signed)
Patient is vol pending TOC placement 

## 2022-12-27 NOTE — ED Notes (Signed)
VOl/Still Pending TOC Placement

## 2022-12-27 NOTE — ED Notes (Signed)
Dinner tray given

## 2022-12-27 NOTE — ED Notes (Signed)
Breakfast tray given. °

## 2022-12-27 NOTE — ED Provider Notes (Signed)
Emergency Medicine Observation Re-evaluation Note  Crystal [name] is a 32 y.o. female, seen on rounds today.  Pt initially presented to the ED for complaints of Back Pain, suicidal ideation, and Homicidal Currently, the patient is in NAD.  Physical Exam  BP 110/73 (BP Location: Right Arm)   Pulse 85   Temp 98.3 F (36.8 C) (Oral)   Resp 18   Ht 5\' 6"  (1.676 m)   Wt 90.9 kg   SpO2 100%   BMI 32.35 kg/m    ED Course / MDM  EKG:   I have reviewed the labs performed to date as well as medications administered while in observation.  Recent changes in the last 24 hours include none.  Plan  Current plan is for TOC dispo.    Shaune Pollack, MD 12/27/22 [phone]

## 2022-12-28 NOTE — ED Notes (Signed)
Pt given lunch tray and water at this time. 

## 2022-12-28 NOTE — ED Notes (Signed)
This EDT gave pt dinner tray and beverage.  

## 2022-12-28 NOTE — ED Notes (Signed)
Report off to kim rn  

## 2022-12-28 NOTE — ED Provider Notes (Signed)
Emergency Medicine Observation Re-evaluation Note  Crystal [name] is a 32 y.o. female, seen in the emergency department for psychiatric complaint.  No acute events since last update. Physical Exam  BP 99/70   Pulse 86   Temp 98.1 F (36.7 C) (Oral)   Resp 18   Ht 5\' 6"  (1.676 m)   Wt 90.9 kg   SpO2 97%   BMI 32.35 kg/m    ED Course / MDM  No recent lab work available for review.  Plan  Current plan is for placement to an appropriate living facility once available.    [name], MD 12/28/22 [phone]

## 2022-12-28 NOTE — ED Notes (Signed)
Pt received PM snack at this time.

## 2022-12-29 NOTE — ED Notes (Signed)
vol/toc placement.. 

## 2022-12-29 NOTE — ED Notes (Signed)
VOL  TOC  PLACEMENT 

## 2022-12-29 NOTE — ED Notes (Signed)
EDT sat and played cards in the dayroom with this pt and pt from [facility] from roughly 45 minutes prior to dinner arrival. Pt laughing and speaking in complete sentences during this time.  Hospital dinner tray and soda in a foam cup provided to pt. All plastic removed from tray prior to pt receiving tray. Pt accepted tray, said "Thank you", and took tray to room for consumption. Meal fully consumed and trash disposed of properly.

## 2022-12-29 NOTE — ED Notes (Signed)
pt recieved snack and drink 

## 2022-12-29 NOTE — ED Provider Notes (Signed)
Emergency Medicine Observation Re-evaluation Note  Crystal [name] is a 32 y.o. female, seen on rounds today.  Pt initially presented to the ED for complaints of Back Pain, suicidal ideation, and Homicidal   Physical Exam  BP 112/86 (BP Location: Left Arm)   Pulse 71   Temp 97.8 F (36.6 C) (Oral)   Resp 20   Ht 5\' 6"  (1.676 m)   Wt 90.9 kg   SpO2 98%   BMI 32.35 kg/m  Physical Exam General: NAD  ED Course / MDM  EKG:   I have reviewed the labs performed to date as well as medications administered while in observation.  Recent changes in the last 24 hours include none  Plan  Current plan is for SW.    Willy Eddy, MD 12/29/22 [phone]

## 2022-12-29 NOTE — ED Notes (Signed)
Pt provided with new crayon and blank sheets of paper at this time.

## 2022-12-30 NOTE — ED Notes (Signed)
Patient moved to room 2 due to room is warmer than room 4, Patient is pleasant and calm, no signs of distress.

## 2022-12-30 NOTE — TOC Progression Note (Signed)
Transition of Care [facility]) - Progression Note    Patient Details  Name: Crystal [name] MRN: [id number] Date of Birth: 1991/01/06  Transition of Care [facility]) CM/SW Contact  Halford Chessman Phone Number: 12/30/2022, 6:12 PM  Clinical Narrative:     [facility] team meeting held with this [facility] supervisor this Clinical research associate, patient, patient's legal guardian [name], Gae Dry LTSS care Production designer, theatre/television/film, and [facility] Insurance underwriter.  Team meeting was to discuss possible placement for patient in [place].  Per team meeting they are working on putting together a behavior plan before the team can move forward with placement possibility on patient.  During team meeting patient was asking appropriate questions, she was calm, and explained the reason she has eloped from her last group home which [facility].  Patient states that if she is accepted she will not try to run away again.  Patient talked about activities that she enjoys.  Patient asked questions regarding if she is accepted how she would get different physicians.  Per patient she states she has learned her lesson and will try to improve and make better decisions.  Patient asked when she would know if she has been accepted, the team informed her that they will discuss and update her after a decision has been made.  Per bedside nurse patient has not had any other behavioral issues, she is taking her medications like she is supposed to and has been pleasant overall.  Partners have scheduled another team meeting for May 8th at 11am.  TOC to continue to follow patient's progress throughout discharge planning.   Expected Discharge Plan: Group Home Barriers to Discharge:  (pending group home acceptance back)  Expected Discharge Plan and Services                                               Social Determinants of Health (SDOH) Interventions SDOH Screenings   Alcohol Screen: Low Risk   (11/11/2020)  Depression (PHQ2-9): Low Risk  (03/17/2022)  Tobacco Use: Medium Risk (11/14/2022)    Readmission Risk Interventions     No data to display

## 2022-12-30 NOTE — ED Notes (Signed)
Dinner tray provided for pt 

## 2022-12-30 NOTE — ED Notes (Signed)
VOL/TOC still seeking Placement

## 2022-12-30 NOTE — ED Notes (Signed)
Pt given PM snack at this time.  

## 2022-12-30 NOTE — ED Notes (Signed)
Patient is up to the bathroom, she is pleasant and no signs of distress, no behavioral issues noted.

## 2022-12-31 NOTE — ED Notes (Signed)
Lunch tray provided. 

## 2022-12-31 NOTE — ED Notes (Signed)
Pt has taken a shower at this time.

## 2022-12-31 NOTE — ED Notes (Addendum)
Dinner tray provided for pt 

## 2022-12-31 NOTE — ED Notes (Signed)
Snack provided

## 2022-12-31 NOTE — ED Notes (Signed)
VOL TOC placement 

## 2022-12-31 NOTE — ED Provider Notes (Signed)
Emergency Medicine Observation Re-evaluation Note  Physical Exam   BP 109/72 (BP Location: Left Arm)   Pulse 99   Temp 98 F (36.7 C) (Oral)   Resp 16   Ht 5\' 6"  (1.676 m)   Wt 90.9 kg   SpO2 96%   BMI 32.35 kg/m   Patient appears in no acute distress.  ED Course / MDM   No reported events during my shift at the time of this note.   Pt is awaiting dispo from SW   Pilar Jarvis MD    Pilar Jarvis, MD 12/31/22 1012

## 2022-12-31 NOTE — ED Notes (Signed)
Given snack. 

## 2022-12-31 NOTE — ED Notes (Signed)
vol/toc placement.. 

## 2023-01-01 NOTE — ED Notes (Signed)
Chaplain at bedside

## 2023-01-01 NOTE — ED Notes (Signed)
Pt provided paper and crayons x2 per request

## 2023-01-01 NOTE — ED Notes (Signed)
Pt given snack. 

## 2023-01-01 NOTE — ED Provider Notes (Signed)
Emergency Medicine Observation Re-evaluation Note  Physical Exam   BP 103/70   Pulse 78   Temp 98.3 F (36.8 C)   Resp 19   Ht 5\' 6"  (1.676 m)   Wt 90.9 kg   SpO2 97%   BMI 32.35 kg/m   Patient appears in no acute distress.  Is currently sleeping without distress  ED Course / MDM   No reported events during my shift at the time of this note.   Pt is awaiting dispo from SW   Sharyn Creamer, MD 01/01/23 [phone]

## 2023-01-01 NOTE — Progress Notes (Signed)
   01/01/23 1600  Spiritual Encounters  Referral source Patient request  Reason for visit Routine spiritual support  OnCall Visit Yes  Spiritual Framework  Presenting Themes Goals in life/care  Community/Connection None  Patient Stress Factors Not reviewed  Interventions  Spiritual Care Interventions Made Reflective listening;Encouragement  Intervention Outcomes  Outcomes Reduced anxiety  Spiritual Care Plan  Spiritual Care Issues Still Outstanding No further spiritual care needs at this time (see row info)   Spoke with patient she was telling me about possibility of going to a group home. Patient seem excited about that possibility. Ask patient how she was during scent she is the only one in the unit. Patient said that she was doing fine just trying to stay busy. Patient request some song lyrics and for me to keep her in my prayers.

## 2023-01-01 NOTE — ED Notes (Signed)
Dinner given

## 2023-01-01 NOTE — ED Notes (Signed)
Breakfast provided and vs obtained in room; pt tolerated well.

## 2023-01-01 NOTE — ED Notes (Signed)
Chaplain requested per pt request

## 2023-01-01 NOTE — ED Notes (Signed)
VOL TOC placement 

## 2023-01-02 NOTE — ED Notes (Signed)
Patient received dinner tray at this time from [place], [place].

## 2023-01-02 NOTE — ED Notes (Signed)
Pt A&Ox4. Pt complaining of having anxiety due to having no information going forward yet as if she is officially accepted into the group home she will possibly be transferred to next month. Patient requested some anxiety medicine to help decrease how she is feeling.

## 2023-01-02 NOTE — ED Provider Notes (Signed)
Emergency Medicine Observation Re-evaluation Note  Crystal [name] is a 32 y.o. female, seen on rounds today.  Pt initially presented to the ED for complaints of Back Pain, suicidal ideation, and Homicidal Currently, the patient is in the shower, no issues reported by nursing staff.  Physical Exam  BP 102/87 (BP Location: Right Arm)   Pulse 92   Temp 98.2 F (36.8 C) (Oral)   Resp 16   Ht 5\' 6"  (1.676 m)   Wt 90.9 kg   SpO2 97%   BMI 32.35 kg/m  Physical Exam Constitutional: Resting comfortably. Eyes: Conjunctivae are normal. Head: Atraumatic. Nose: No congestion/rhinnorhea. Mouth/Throat: Mucous membranes are moist. Neck: Normal ROM Cardiovascular: No cyanosis noted. Respiratory: Normal respiratory effort. Gastrointestinal: Non-distended. Genitourinary: deferred Musculoskeletal: No lower extremity tenderness nor edema. Neurologic:  Normal speech and language. No gross focal neurologic deficits are appreciated. Skin:  Skin is warm, dry and intact. No rash noted.   ED Course / MDM  EKG:   I have reviewed the labs performed to date as well as medications administered while in observation.  Recent changes in the last 24 hours include none.  Plan  Current plan is for dispo per social work.    Chesley Noon, MD 01/02/23 [phone]

## 2023-01-02 NOTE — ED Notes (Signed)
Pt provided with supplies and is showering at this time.

## 2023-01-02 NOTE — ED Notes (Signed)
Snack and water given

## 2023-01-02 NOTE — ED Notes (Signed)
VOL TOC placement 

## 2023-01-02 NOTE — ED Notes (Signed)
Patient up to bathroom, she is oriented, no signs of distress, wants to take shower, hygiene items will be obtained for her, she is compliant and takes her medications that are ordered. Staff will continue to monitor for safety.

## 2023-01-02 NOTE — ED Notes (Signed)
Pt took nighttime meds and tolerated well.

## 2023-01-03 NOTE — ED Notes (Signed)
Lunch tray and water delivered to pt

## 2023-01-03 NOTE — ED Notes (Signed)
Pt took a shower 

## 2023-01-03 NOTE — ED Notes (Signed)
Patient given ice cream and drink for snack per request.

## 2023-01-03 NOTE — ED Provider Notes (Signed)
Emergency Medicine Observation Re-evaluation Note  Crystal [name] is a 32 y.o. female, seen on rounds today.  Pt initially presented to the ED for complaints of Back Pain, suicidal ideation, and Homicidal  Currently, the patient is calm, no acute complaints.  Physical Exam  Blood pressure 100/65, pulse 94, temperature 98.6 F (37 C), temperature source Oral, resp. rate 17, height 5\' 6"  (1.676 m), weight 90.9 kg, SpO2 98 %. Physical Exam General: NAD Lungs: CTAB Psych: not agitated  ED Course / MDM  EKG:    I have reviewed the labs performed to date as well as medications administered while in observation.  Recent changes in the last 24 hours include no acute events overnight.    Plan  Current plan is for TOC placement. Patient is not under full IVC at this time.   Sharman Cheek, MD 01/03/23 1031

## 2023-01-03 NOTE — ED Notes (Signed)
Patient lying in bed, eyes closed, equal chest rise and fall noted.  Patient in NAD at this time.  

## 2023-01-03 NOTE — ED Notes (Signed)
Pt given dinner tray and beverage  

## 2023-01-03 NOTE — ED Notes (Signed)
Patient sleeping at this time, chest equal rise and fall, in no apparent distress. 

## 2023-01-03 NOTE — ED Notes (Signed)
Pt resting in no apparent distress with equal chest rise and fall. 

## 2023-01-03 NOTE — ED Notes (Signed)
Pt given breakfast and beverage  

## 2023-01-03 NOTE — ED Notes (Signed)
Patient is vol pending TOC placement 

## 2023-01-04 NOTE — ED Notes (Signed)
Vol  pending  TOC  placement 

## 2023-01-04 NOTE — ED Notes (Signed)
Pt taking shower. Pt was given hygiene items and the following, 1 clean top, 1 clean bottom, with 1 pair of disposable underwear.  Pt changed out into clean clothing.  Staff disposed of all shower supplies.   

## 2023-01-04 NOTE — ED Notes (Signed)
Pt requesting night meds, states she feels more improved to take po

## 2023-01-04 NOTE — ED Notes (Signed)
Dinner tray and soda provided  

## 2023-01-04 NOTE — ED Notes (Signed)
VOL  TOC  PLACEMENT 

## 2023-01-04 NOTE — ED Notes (Signed)
Pt again vomiting, in restroom at this time. A secure chat message is sent to Dr. Larinda Buttery at this time to notify and ask for any guidance.

## 2023-01-04 NOTE — TOC Progression Note (Signed)
Transition of Care [facility]) - Progression Note    Patient Details  Name: Crystal [name] MRN: [id number] Date of Birth: Mar 27, 1991  [facility]) CM/SW Contact  Darleene Cleaver, [place] Phone Number: 01/04/2023, 7:11 PM  Clinical Narrative:    TOC continuing to follow patient's progress throughout discharge planning.  Group home in [facility] still evaluating patient.   Expected Discharge Plan: Group Home Barriers to Discharge:  (pending group home acceptance back)  Expected Discharge Plan and Services                                               Social Determinants of Health (SDOH) Interventions SDOH Screenings   Alcohol Screen: Low Risk  (11/11/2020)  Depression (PHQ2-9): Low Risk  (03/17/2022)  Tobacco Use: Medium Risk (11/14/2022)    Readmission Risk Interventions     No data to display

## 2023-01-04 NOTE — ED Notes (Signed)
Lunch tray with water provided

## 2023-01-04 NOTE — ED Notes (Signed)
Pt vomitted in trash bag in unit. Pt claimed not to be able to stop until she received Zofran and it immediately stopped

## 2023-01-04 NOTE — ED Provider Notes (Signed)
Emergency Medicine Observation Re-evaluation Note  Crystal [name] is a 32 y.o. female, seen on rounds today.  Pt initially presented to the ED for complaints of Back Pain, suicidal ideation, and Homicidal  Physical Exam  BP 112/69 (BP Location: Right Arm)   Pulse 78   Temp 98.6 F (37 C) (Oral)   Resp 18   Ht 5\' 6"  (1.676 m)   Wt 90.9 kg   SpO2 98%   BMI 32.35 kg/m  Physical Exam   ED Course / MDM  EKG:   I have reviewed the labs performed to date as well as medications administered while in observation.  Recent changes in the last 24 hours include none.  Plan  Current plan is for SW.    Willy Eddy, MD 01/04/23 [phone]

## 2023-01-04 NOTE — ED Notes (Signed)
Pt given PM snack at this time.  

## 2023-01-04 NOTE — ED Notes (Signed)
Breakfast tray provided and VS obtained. Pt states the pt in room 5 makes her concerned about her safety. This nurse acknowledged pt concerns and reassured her we would continue to monitor closely to ensure her safety.

## 2023-01-05 NOTE — ED Notes (Signed)
Patient has received snack and drink.  

## 2023-01-05 NOTE — ED Notes (Signed)
Breakfast tray and juice provided; VS obtained

## 2023-01-05 NOTE — ED Notes (Signed)
Snack and water provided  

## 2023-01-05 NOTE — ED Notes (Signed)
Pt given dinner tray and drink; no other needs voiced at this time. 

## 2023-01-05 NOTE — ED Provider Notes (Signed)
Emergency Medicine Observation Re-evaluation Note  Crystal [name] is a 32 y.o. female, seen on rounds today.  Pt initially presented to the ED for complaints of Back Pain, suicidal ideation, and Homicidal  Currently, the patient is calm, no acute complaints.  Physical Exam  Blood pressure 118/74, pulse 81, temperature 98 F (36.7 C), temperature source Oral, resp. rate 20, height  (1.676 m), weight 90.9 kg, SpO2 96 %. Physical Exam General: NAD Lungs: CTAB Psych: not agitated  ED Course / MDM  EKG:    I have reviewed the labs performed to date as well as medications administered while in observation.  Recent changes in the last 24 hours include no acute events overnight.    Plan  Current plan is for SW placement. Patient is not under full IVC at this time.   Sharman Cheek, MD 01/05/23 2011

## 2023-01-05 NOTE — ED Notes (Signed)
Lunch and water provided; pt resting in room with lights off

## 2023-01-05 NOTE — ED Notes (Signed)
VOL TOC placement 

## 2023-01-06 NOTE — ED Notes (Signed)
VOL  TOC  PLACEMENT 

## 2023-01-06 NOTE — ED Notes (Signed)
Report off to April rn  

## 2023-01-06 NOTE — ED Notes (Signed)
Pt up and in dayroom

## 2023-01-06 NOTE — ED Notes (Signed)
Report to wendy, rn

## 2023-01-06 NOTE — ED Notes (Signed)
vol/toc placement.. 

## 2023-01-06 NOTE — ED Notes (Signed)
Pt. given dinner tray and beverage. No other needs verbalized at this time.  

## 2023-01-06 NOTE — ED Notes (Signed)
Patient in dayroom, no signs of distress, Patient is drawing with crayons, will continue to monitor.

## 2023-01-06 NOTE — TOC Progression Note (Signed)
Transition of Care [facility]) - Progression Note    Patient Details  Name: Crystal [name] MRN: [id number] Date of Birth: [birth year]  Transition of Care [facility]) CM/SW Contact  Darleene Cleaver, [place] Phone Number: 01/06/2023, 4:43 PM  Clinical Narrative:     [facility] emailed patient's legal guardian to see if they have any updates from the group home that evaluated patient last week.  TOC continuing to follow patient's progress throughout discharge planning.   Expected Discharge Plan: Group Home Barriers to Discharge:  (pending group home acceptance back)  Expected Discharge Plan and Services                                               Social Determinants of Health (SDOH) Interventions SDOH Screenings   Alcohol Screen: Low Risk  (11/11/2020)  Depression (PHQ2-9): Low Risk  (03/17/2022)  Tobacco Use: Medium Risk (11/14/2022)    Readmission Risk Interventions     No data to display

## 2023-01-06 NOTE — ED Notes (Signed)
Patient is sitting in the dayroom, no signs of distress, she does ask same questions over and over about group home placement, Nurse will continue to monitor.

## 2023-01-07 NOTE — ED Provider Notes (Addendum)
Emergency Medicine Observation Re-evaluation Note  Crystal [name] is a 32 y.o. female, seen on rounds today.  Pt initially presented to the ED for complaints of Back Pain, suicidal ideation, and Homicidal  Currently, the patient is no acute distress. Pt asleep when I assess  Physical Exam  Blood pressure 113/75, pulse 70, temperature 98.1 F (36.7 C), temperature source Oral, resp. rate 17, height  (1.676 m), weight 90.9 kg, SpO2 96 %.  Physical Exam General: No apparent distress Pulm: Normal WOB Psych: resting     ED Course / MDM     I have reviewed the labs performed to date as well as medications administered while in observation.   Plan   Current plan is to continue to wait for SW Patient is not under full IVC at this time.   Concha Se, MD 01/07/23 1610    Concha Se, MD 01/07/23 [phone]

## 2023-01-07 NOTE — ED Notes (Signed)
Pt given nighttime snack. 

## 2023-01-07 NOTE — TOC Progression Note (Addendum)
Transition of Care [facility]) - Progression Note    Patient Details  Name: Crystal [name] MRN: [id number] Date of Birth: Jul 01, 1991  Transition of Care [facility]) CM/SW Contact  Halford Chessman Phone Number: 01/07/2023, 9:04 PM  Clinical Narrative:     Received an email from [facility] case worker.  They have another potential placement option, possible video team meeting tomorrow at 1pm to evaluate patient and see if she would be a good candidate for group home.  Referral made to [facility] to look for placement options as well. TOC to continue to follow patient's progress.   Expected Discharge Plan: Group Home Barriers to Discharge:  (pending group home acceptance back)  Expected Discharge Plan and Services                                               Social Determinants of Health (SDOH) Interventions SDOH Screenings   Alcohol Screen: Low Risk  (11/11/2020)  Depression (PHQ2-9): Low Risk  (03/17/2022)  Tobacco Use: Medium Risk (11/14/2022)    Readmission Risk Interventions     No data to display

## 2023-01-07 NOTE — ED Notes (Signed)
Pt. Alert and oriented, warm and dry, in no distress. Pt. Denies SI, HI, and AVH. Pt. Encouraged to let nursing staff know of any concerns or needs. PRN ibuprofen given for pain with good effect.

## 2023-01-07 NOTE — ED Notes (Signed)
VOL/Pending Placement 

## 2023-01-08 NOTE — ED Notes (Signed)
This tech obtained vital signs on pt.  

## 2023-01-08 NOTE — ED Notes (Signed)
Lunch tray given with grape juice. Pt denied any other needs at this time

## 2023-01-08 NOTE — TOC Progression Note (Addendum)
Transition of Care [facility]) - Progression Note    Patient Details  Name: Crystal [name] MRN: [id number] Date of Birth: June 27, 1991  Transition of Care [facility]) CM/SW Contact  Halford Chessman Phone Number: 01/08/2023, 5:58 PM  Clinical Narrative:     TOC received phone call from St[facility] at [facility].  There is an [facility] family living facility called [facility] who will assess [name] either Sunday or Monday to determine if she would be a good fit for them.  Manus Gunning will be evaluating patient, TOC provided contact information for the [facility] team member who will be covering on Sunday or Monday so Selena Batten can contact upon arrival.  TOC to continue to follow patient's progress throughout discharge planning.    Expected Discharge Plan: Group Home Barriers to Discharge:  (pending group home acceptance back)  Expected Discharge Plan and Services    Group home or assisted family living (AFL) facility                                           Social Determinants of Health (SDOH) Interventions SDOH Screenings   Alcohol Screen: Low Risk  (11/11/2020)  Depression (PHQ2-9): Low Risk  (03/17/2022)  Tobacco Use: Medium Risk (11/14/2022)    Readmission Risk Interventions     No data to display

## 2023-01-08 NOTE — ED Notes (Signed)
Patient is calm and cooperative, no signs of distress, out of shower and ask for more water to drink, Nurse obtained. Staff will continue to monitor for safety.

## 2023-01-08 NOTE — ED Notes (Signed)
VOL TOC placement 

## 2023-01-08 NOTE — ED Provider Notes (Signed)
Emergency Medicine Observation Re-evaluation Note  Brandin Dilday is a 32 y.o. female, seen on rounds today.  Pt initially presented to the ED for complaints of Back Pain, suicidal ideation, and Homicidal  Currently, the patient is calm, no acute complaints.  Physical Exam  Blood pressure 105/73, pulse 100, temperature 98.1 F (36.7 C), temperature source Oral, resp. rate 17, height  (1.676 m), weight 90.9 kg, SpO2 98 %. Physical Exam General: NAD Lungs: CTAB Psych: not agitated  ED Course / MDM  EKG:    I have reviewed the labs performed to date as well as medications administered while in observation.  Recent changes in the last 24 hours include no acute events overnight.    Plan  Current plan is for [facility] placement   Sharman Cheek, MD 01/08/23 [phone]

## 2023-01-08 NOTE — ED Notes (Signed)
Hospital meal provided, pt tolerated w/o complaints.  Waste discarded appropriately.  

## 2023-01-08 NOTE — ED Notes (Addendum)
Snack was provided with water. Pt denied.No other needs at this time

## 2023-01-08 NOTE — ED Notes (Addendum)
Pt taking shower. Pt was given hygiene items and the following, 1 clean top, 1 clean bottom, with 1 pair of disposable underwear.  Pt changed out into clean clothing.  Staff disposed of all shower supplies.  Pt changed sheets on bed. 

## 2023-01-08 NOTE — ED Notes (Signed)
Patient up to the bathroom, she is calm and cooperative, no signs of distress.

## 2023-01-09 NOTE — ED Notes (Signed)
Pt walked over to this EDT as EDT was in the dayroom to check everybody's VS. Pt seen with a small amount of blood running down her left thumb into the palm of her hand. EDT inspected pts thumb and hand. When asked what happened, pt stated "I picked the skin beside my nail and it started bleeding." When asked for a further explanation of the cause of injury, pt stated she "pulled at it until it bled."   Pt endorsed the action of pulling her skin off hurt. EDT discussed with pt how this behavior should be avoided in the future as to not hurt ourselves for no reason. Pt nodded in agreement and stated " I don't think I will do this again because it really hurts."  RN notified and gathering supplies to clean and dress pts thumb.

## 2023-01-09 NOTE — ED Notes (Signed)
Pt seen attempting to peel back metal plate covering light switch. Pt informed by Security and this EDT to stop messing with the light plate cover because it could hurt her. Pt stopped messing with light cover.  Pt proceeded to sit on the floor and pull of part of the plastic base molding in her room. Security and EDT talked to pt and explained how her actions have consequences. EDT offered to turn her tv on so she would not be so bored. Pt refused to have her tv turned on.

## 2023-01-09 NOTE — ED Notes (Signed)
Pt has been in and out of the bathroom since this writers last note. Pt only comes out briefly to let others in then returns to the bathrom stating"I just have to pee a lot". Periodic retching heard coming from the bathroom while the pt is in the bathroom.   EDT informed pt to com out of the bathroom to receive her dinner tray and drink. Pt exited bathroom, took dinner tray to room, and came back out of the room with the tray after approximately less than 3 minutes. Pt stated she "needs to go pee again." Pt let back into bathroom.  Concerned for how much this pt is having to urinate in such a short period of time, EDT knocked on the bathroom door with this pt inside and informed pt that it might be helpful to obtain a urine specimen to rule out health concerns such as a UTI. Pt said "Okay" then proceeded to state she "needs to go number 2." Pt will not come out of the bathroom to accept a urine specimen cup so a urine specimen can be obtained. RN notified.

## 2023-01-09 NOTE — ED Notes (Addendum)
EDT arrived to the [facility] and was made aware by the RN of the numerous puzzles, papers, crayons, among other privileged items, this pt had in her possession that were recollected. EDT made aware of the writing of self harm on such papers found within her room.   This pt knocks on the door and asks to speak with this EDT. Pt explains how "many puzzles from the corner on my room were taken away." Pt expressed she believed these items were donated specifically to her, and were not for anyone else. EDT listened to pt and made pt aware that privileges, including but not limited to puzzles, cards, papers, and crayons, are for the [facility] occupants, meaning these items are to be shared amongst everyone in the [place]. Pt nodded her head when asked if that made sense and stated "Yes, I understand."

## 2023-01-09 NOTE — ED Provider Notes (Signed)
Emergency Medicine Observation Re-evaluation Note  Toneka Fullen is a 32 y.o. female, seen on rounds today.  Pt initially presented to the ED for complaints of Back Pain, suicidal ideation, and Homicidal  Currently, the patient is is no acute distress. Denies any concerns at this time.  Physical Exam  Blood pressure 108/79, pulse 87, temperature 98.5 F (36.9 C), temperature source Oral, resp. rate 20, height  (1.676 m), weight 90.9 kg, SpO2 97 %.  Physical Exam: General: No apparent distress Pulm: Normal WOB Neuro: Moving all extremities Psych: Resting comfortably     ED Course / MDM     I have reviewed the labs performed to date as well as medications administered while in observation.  Recent changes in the last 24 hours include: No acute events overnight.  Plan   Current plan: Patient awaiting TOC placement. Patient is not under full IVC at this time.    Corena Herter, MD 01/09/23 1328

## 2023-01-09 NOTE — ED Notes (Signed)
Patient is vol pending TOC placement 

## 2023-01-09 NOTE — ED Notes (Signed)
Patient's left thumb was bleeding; patient stated she was "picking at it and tore the side part of her nail." This RN cleansed the patient's bleeding nail with saline, applied bacitracin, and band-aid.

## 2023-01-09 NOTE — ED Notes (Addendum)
Pt continuously knocking on the door to the nurses station asking for this EDT, RN, and Security. Pt asked EDT many questions, one including what can be used to get crayon writing off her walls. EDT informed pt that EDT needs to catch up on documentation and would come to the pts room to discuss cleaning matters at EDT's next available opportunity.   All of pts other needs met in a timely manner and pt instructed to go back to her room for quiet time prior to bedtime. Pt agreed, walked back to her room, then back to the nurses station to ask this EDT another question about washing the profanity she wrote earlier in the shift off her walls.  Pt reminded by EDT and Security of EDT's solution in due time, and that pt must go back to her room and leave the nurses station alone until EDT can get back to the pt after documenting. Pt willingly went back to her room. TV channel turned to one of pts request.

## 2023-01-09 NOTE — ED Notes (Signed)
Patient became upset with this RN and threw a cup of water at me. Pt instructed to go back to room. Books, crayons, and puzzle collected from patient's room. Informed patient that she will not be allowed to have privileges for a week d/t her behavior. Explained to patient that she will have to not have any outbursts or behavior issues for a week in order to receive her privileges back. Pt states, "I'll do it again, bitch!" This RN then noted that the patient wrote multiple profanities on the wall in her room. This RN, security, and NT returned to nursing station.

## 2023-01-09 NOTE — ED Notes (Signed)
Found 5 puzzles and 6 books in patient's room. Informed patient that she may not keep more than one puzzle and one book in her room at once, and she can swap them out if needed. Pt then informed us that she had "some things" in one of the puzzle boxes. Upon further investigation, a thick stack of papers and additional books found in one of the puzzle boxes. Pt has written multiple letters to "Miss Vonna Kotyk" stating "I don't want to go to a new group home because I feel like if I were to go to a new group home I would harm myself with a sharp weapon." Also found numerous blank coloring pages, crosswords, word searches, etc. Informed patient that from here on out she will only be allowed to have a set number of papers in her room, and if she would like to she may trade them out with staff for new ones. Pt verbalized understanding.

## 2023-01-09 NOTE — ED Notes (Signed)
EDT cleaned water off the floor in the nurses station as RN cleaned the water that was thrown onto her clothes. Due to pt refusing to go back into her room, EDT came into the day room to enforce what the RN had told the pt to do. Pt escorted back to her room by EDT and Security.  Pts room and person searched to remove all privileged items due to her aggressive outburst and writing profane language on the walls within her room. No more cups of water, books, papers, crayons, or cards found in pts room.

## 2023-01-09 NOTE — ED Notes (Signed)
Pt provided with incontinent wipes to use to wash profanity written in crayon off her walls. Pt currently in her room scrubbing the walls with the provided wipes.

## 2023-01-10 NOTE — ED Notes (Signed)
Patient received dinner tray at this time.  

## 2023-01-10 NOTE — ED Notes (Signed)
C/o headache.  Tylenol given

## 2023-01-10 NOTE — ED Notes (Signed)
Patient given snack.  

## 2023-01-10 NOTE — ED Notes (Signed)
Patient changed the lien on her bed.

## 2023-01-10 NOTE — ED Notes (Signed)
Offered pt a shower at this time, pt refused.

## 2023-01-10 NOTE — ED Notes (Signed)
Patient is vol pending TOC placement 

## 2023-01-10 NOTE — ED Notes (Signed)
Patient offered to shower. Pt declined.

## 2023-01-10 NOTE — ED Notes (Signed)
VOL TOC placement 

## 2023-01-10 NOTE — ED Notes (Signed)
Assessment for facility.  Facility representative in with patient.

## 2023-01-10 NOTE — ED Notes (Signed)
Pt walked to bathroom with no assistance. Pt received breakfast tray and returned to room.

## 2023-01-10 NOTE — ED Notes (Signed)
Lunch tray given. 

## 2023-01-11 NOTE — ED Notes (Signed)
Pt provided with breakfast tray and cranberry juice  

## 2023-01-11 NOTE — TOC Progression Note (Signed)
Transition of Care [facility]) - Progression Note    Patient Details  Name: Crystal [name] MRN: [id number] Date of Birth: 13-Apr-1991  Transition of Care [facility]) CM/SW Contact  Darleene Cleaver, [place] Phone Number: 01/11/2023, 4:59 PM  Clinical Narrative:     CSW received an email from [facility] who evaluated patient over the weekend.  She will pursue trying to find placement for patient.  TOC to continue to follow patient's progress throughout discharge planning.   Expected Discharge Plan: Group Home Barriers to Discharge:  (pending group home acceptance back)  Expected Discharge Plan and Services                                               Social Determinants of Health (SDOH) Interventions SDOH Screenings   Alcohol Screen: Low Risk  (11/11/2020)  Depression (PHQ2-9): Low Risk  (03/17/2022)  Tobacco Use: Medium Risk (11/14/2022)    Readmission Risk Interventions     No data to display

## 2023-01-11 NOTE — ED Notes (Signed)
Pt given PM snack at this time.  

## 2023-01-11 NOTE — ED Provider Notes (Signed)
Emergency Medicine Observation Re-evaluation Note  Crystal [name] is a 32 y.o. female, seen on rounds today.  Pt initially presented to the ED for complaints of Back Pain, suicidal ideation, and Homicidal  Currently, the patient is no acute distress. Asleep- no issues per bhu nursing team  Physical Exam  Blood pressure 118/79, pulse 88, temperature 97.6 F (36.4 C), temperature source Oral, resp. rate 20, height  (1.676 m), weight 90.9 kg, SpO2 97 %.  Physical Exam General: No apparent distress Pulm: Normal WOB Psych: resting     ED Course / MDM     I have reviewed the labs performed to date as well as medications administered while in observation.  Recent changes in the last 24 hours include none   Plan   Current plan is to continue to wait for SW Patient is not under full IVC at this time.   Concha Se, MD 01/11/23 [phone]

## 2023-01-11 NOTE — ED Notes (Signed)
VOL TOC placement 

## 2023-01-11 NOTE — ED Notes (Signed)
Pt provided with dinner tray and soda

## 2023-01-12 NOTE — ED Notes (Signed)
VOL/TOC Placement 

## 2023-01-12 NOTE — ED Notes (Signed)
Pt given nighttime snack. 

## 2023-01-12 NOTE — ED Notes (Signed)
Pt to restroom and has returned back to room now

## 2023-01-12 NOTE — ED Notes (Signed)
vol/toc.... 

## 2023-01-12 NOTE — ED Notes (Addendum)
Pt seen walking towards doorway of nurses station where ED tech Sunny Schlein was speaking with another pt. Pt threw away her paper spoon and then threw her full of ice water on ED tech's right side which and then smiled at staff threw her cup on the floor and walked away. Pt then went to her room and sat on the bed. This nurse went to pt room and turned off the television. Pt was told her television would remain off and would now be included with her restrictions due to her behavior. Pt did not respond but remained sitting up on side of bed with her eyes averted.

## 2023-01-12 NOTE — ED Provider Notes (Signed)
Emergency Medicine Observation Re-evaluation Note  Crystal [name] is a 32 y.o. female, seen on rounds today.  Pt initially presented to the ED for complaints of Back Pain, suicidal ideation, and Homicidal   Physical Exam  BP 116/74 (BP Location: Left Arm)   Pulse 88   Temp 98.1 F (36.7 C) (Oral)   Resp 20   Ht 1.676 m ( )   Wt 90.9 kg   SpO2 98%   BMI 32.35 kg/m  Physical Exam General: In bed, no distress Cardiac:  Lungs: No tachypnea Psych: No issues overnight  ED Course / MDM    Plan  TOC is working on dispo, plan is for discharge back to group home    Jene Every, MD 01/12/23 [phone]

## 2023-01-12 NOTE — ED Notes (Signed)
Pt given dinner tray and a SMALL amount of water at this time d/t pt throwing water at a staff member earlier. No other needs voiced at this time.

## 2023-01-12 NOTE — ED Notes (Signed)
Legal guardian called and VM left

## 2023-01-12 NOTE — ED Notes (Signed)
Environmental health practitioner arrived to [employer] to take incident report from ED tech regarding getting water getting thrown onto her by this patient. Pt came to nurses station door and asked "Am I going to jail?" And seen smiling at staff and security. Staff did not respond to patient at that time. Pt remained at the door and started yelling through the door "yeah I poured water on her fat ass". Pt then called out to staff giving a list of personal belongings, puzzles, and coloring books she wanted staff to bring to her for when she left to go to jail. Pt redirected back to her room and told to stay away from glass door. Pt verbalized understanding.  Case number [id number]

## 2023-01-12 NOTE — ED Notes (Signed)
Pt provided with breakfast tray and juice  

## 2023-01-13 NOTE — ED Notes (Signed)
Patient has been pleasant, no behavioral issues noted, she has apologized about throwing water yesterday, and she is aware that she will be getting smaller cups for the next few days to prevent this from being a problem, she agreed and said she was going to act better, states that she was so upset about not having anywhere to live that she thought people were saying she would never get anywhere and she acted out, Nurse talked to her about anger, and being impulsive and the issues it can cause , also relayed to her other ways to handle her frustrations. Staff will continue to monitor her for safety.

## 2023-01-13 NOTE — ED Provider Notes (Signed)
Emergency Medicine Observation Re-evaluation Note  Crystal [name] is a 32 y.o. female, seen on rounds today.  Pt initially presented to the ED for complaints of Back Pain, suicidal ideation, and Homicidal  Currently, the patient is no acute distress. Sleeping- no issues per bhu nurse  Physical Exam  Blood pressure 115/74, pulse 84, temperature 98.5 F (36.9 C), temperature source Oral, resp. rate 18, height  (1.676 m), weight 90.9 kg, SpO2 97 %.  Physical Exam General: No apparent distress Pulm: Normal WOB Psych: resting     ED Course / MDM     I have reviewed the labs performed to date as well as medications administered while in observation.  Recent changes in the last 24 hours include none  Plan   Current plan is to continue to wait for SW Patient is not under full IVC at this time.   Concha Se, MD 01/13/23 [phone]

## 2023-01-13 NOTE — ED Notes (Signed)
Nurse sit with patient in the dayroom and she talked about some of the places she had lived and how she hopes she can move from this place soon, Nurse let her know that she would be told if any updates, staff will continue to monitor for safety.

## 2023-01-14 NOTE — ED Notes (Signed)
Pt. Alert and oriented, warm and dry, in no distress. Pt. Denies SI, HI, and AVH. Pt. Encouraged to let nursing staff know of any concerns or needs. 

## 2023-01-14 NOTE — ED Notes (Signed)
Hospital meal provided, pt tolerated w/o complaints.  Waste discarded appropriately.  

## 2023-01-14 NOTE — ED Notes (Signed)
This tech obtained vital signs on pt.  

## 2023-01-14 NOTE — ED Provider Notes (Signed)
Emergency Medicine Observation Re-evaluation Note  Physical Exam   BP 120/73 (BP Location: Left Arm)   Pulse 73   Temp (!) 97.5 F (36.4 C) (Oral)   Resp 17   Ht  (1.676 m)   Wt 90.9 kg   SpO2 98%   BMI 32.35 kg/m   Patient appears in no acute distress.  ED Course / MDM   No reported events during my shift at the time of this note.   Pt is awaiting dispo from SW   Pilar Jarvis MD    Pilar Jarvis, MD 01/14/23 [phone]

## 2023-01-14 NOTE — ED Notes (Signed)
Patient's HR this am was 116-122 while sitting on side of bed after just waking up. Patient was asymptomatic, denies any recent illness. HR checked again with HR being 100-116 at rest, continues to be asymptomatic with all other vitals normal. Dr. Fanny Bien notified. No new orders at this time. Vital signs to be rechecked after lunch.

## 2023-01-14 NOTE — ED Notes (Signed)
Left message with guardian about retrieving patient's glasses to bring to patient if possible.

## 2023-01-14 NOTE — ED Notes (Signed)
Spoke to legal guardian (VM left for her from previous shift). Guardian updated on patient's behavior yesterday (throwing water on tech). Guardian still working to get placement, has not heard back from last place that interviewed patient. Guardian states she will be coming to see patient next week.

## 2023-01-14 NOTE — ED Notes (Signed)
Pt given snack and beverage. No other snack at the moment.

## 2023-01-15 NOTE — ED Notes (Signed)
Pt complained of agitation and anxiety due to actions of other patients. Pt unable to self sooth so PRN given

## 2023-01-15 NOTE — ED Notes (Signed)
VOL TOC placement 

## 2023-01-15 NOTE — ED Provider Notes (Signed)
Emergency Medicine Observation Re-evaluation Note  Physical Exam   BP 125/81 (BP Location: Left Arm)   Pulse 75   Temp 97.6 F (36.4 C) (Oral)   Resp 20   Ht 5\' 6"  (1.676 m)   Wt 90.9 kg   SpO2 98%   BMI 32.35 kg/m   Patient appears in no acute distress. Resting in bed.  ED Course / MDM   No reported events during my shift at the time of this note.   Pt is awaiting dispo from SW    Sharyn Creamer, MD 01/15/23 [phone]

## 2023-01-15 NOTE — ED Notes (Signed)
Gave pt dinner tray and beverage 

## 2023-01-16 NOTE — ED Notes (Signed)
Pt taking shower. Pt was given hygiene items and the following, 1 clean top, 1 clean bottom, with 1 pair of disposable underwear.  Pt changed out into clean clothing.  Staff disposed of all shower supplies.   

## 2023-01-16 NOTE — ED Notes (Signed)
Hospital meal provided, pt tolerated w/o complaints.  Waste discarded appropriately.  

## 2023-01-16 NOTE — ED Provider Notes (Signed)
Emergency Medicine Observation Re-evaluation Note  Samyia Motter is a 32 y.o. female, seen on rounds today.  Pt initially presented to the ED for complaints of Back Pain, suicidal ideation, and Homicidal Currently, the patient is resting comfortably.  Physical Exam  BP 112/66   Pulse 77   Temp 98.2 F (36.8 C)   Resp 16   Ht 5\' 6"  (1.676 m)   Wt 90.9 kg   SpO2 94%   BMI 32.35 kg/m  Physical Exam General: No acute distress Cardiac: Well-perfused extremities Lungs: No respiratory distress Psych: Appropriate mood and affect  ED Course / MDM  EKG:   I have reviewed the labs performed to date as well as medications administered while in observation.  Recent changes in the last 24 hours include none.  Plan  Current plan is for placement.    Merwyn Katos, MD 01/16/23 1600

## 2023-01-16 NOTE — ED Notes (Signed)
Patient is vol pending placement 

## 2023-01-16 NOTE — ED Notes (Signed)
Lunch tray provided for pt

## 2023-01-17 NOTE — ED Notes (Signed)
Visitors from [facility] here to eval pt. Pt sitting in day room during interview, pt remained clam and cooperative during interview.  Magnus Sinning left email and phone number to reach out with more info about pt. Will pass her info along.  Email: [email or web address] Phone: [phone] Fax: [phone]

## 2023-01-17 NOTE — ED Notes (Signed)
VOL TOC placement 

## 2023-01-17 NOTE — ED Notes (Signed)
Pt given nighttime snack. 

## 2023-01-17 NOTE — ED Provider Notes (Signed)
Emergency Medicine Observation Re-evaluation Note  Crystal [name] is a 32 y.o. female, seen on rounds today.  Pt initially presented to the ED for complaints of Back Pain, suicidal ideation, and Homicidal   Physical Exam  BP 103/71 (BP Location: Left Arm)   Pulse 77   Temp 98.3 F (36.8 C) (Oral)   Resp 18   Ht 5\' 6"  (1.676 m)   Wt 90.9 kg   SpO2 97%   BMI 32.35 kg/m  Physical Exam General: NAD  ED Course / MDM  EKG:   I have reviewed the labs performed to date as well as medications administered while in observation.  Recent changes in the last 24 hours include nonde.  Plan  Current plan is for SW    Willy Eddy, MD 01/17/23 [phone]

## 2023-01-17 NOTE — ED Notes (Signed)
Patient is vol pending TOC placement 

## 2023-01-18 NOTE — TOC Progression Note (Signed)
Transition of Care [facility]) - Progression Note    Patient Details  Name: Crystal [name] MRN: [id number] Date of Birth: 07/11/1991  [facility] of [facility]) CM/SW Contact  Darleene Cleaver, [place] Phone Number: 01/15/2023  Clinical Narrative:    CSW attempted to contact Independent placement contractor Shawnee Knapp, [phone], regarding placement for patient.  CSW left a message awaiting for a call back.   Expected Discharge Plan: Group Home Barriers to Discharge:  (pending group home acceptance back)  Expected Discharge Plan and Services                                               Social Determinants of Health (SDOH) Interventions SDOH Screenings   Alcohol Screen: Low Risk  (11/11/2020)  Depression (PHQ2-9): Low Risk  (03/17/2022)  Tobacco Use: Medium Risk (11/14/2022)    Readmission Risk Interventions     No data to display

## 2023-01-18 NOTE — TOC Progression Note (Signed)
Transition of Care [facility]) - Progression Note    Patient Details  Name: Crystal [name] MRN: [id number] Date of Birth: 02-28-1991  Transition of Care [facility]) CM/SW Contact  Darleene Cleaver, [place] Phone Number: 01/13/2023 11:17 AM  Clinical Narrative:    CSW left a message with Kennyth Arnold from [facility] to get an update on if [facility] will accept patient, waiting for a phone call back.   Expected Discharge Plan: Group Home Barriers to Discharge:  (pending group home acceptance back)  Expected Discharge Plan and Services                                               Social Determinants of Health (SDOH) Interventions SDOH Screenings   Alcohol Screen: Low Risk  (11/11/2020)  Depression (PHQ2-9): Low Risk  (03/17/2022)  Tobacco Use: Medium Risk (11/14/2022)    Readmission Risk Interventions     No data to display

## 2023-01-18 NOTE — TOC Progression Note (Addendum)
Transition of Care [facility]) - Progression Note    Patient Details  Name: Crystal [name] MRN: [id number] Date of Birth: 1991/01/25  Transition of Care [facility]) CM/SW Contact  Halford Chessman Phone Number: 01/18/2023, 11:57 AM  Clinical Narrative:    CSW sent email to Denton Meek LTSS Care Manager at [facility] to ask for an update on group home placement or [facility].  CSW also attempted to call her and left a message on voice mail.  12:15pm CSW spoke to [facility], she stated that [facility] was at the hospital to evaluate another patient and asked to evaluate this patient as well.  CSW to contact Magnus Sinning from [facility] to get an update on what they decided.  Per patient's legal guardian medical records can be sent to them to review patient.  Expected Discharge Plan: Group Home Barriers to Discharge:  (pending group home acceptance back)  Expected Discharge Plan and Services                                               Social Determinants of Health (SDOH) Interventions SDOH Screenings   Alcohol Screen: Low Risk  (11/11/2020)  Depression (PHQ2-9): Low Risk  (03/17/2022)  Tobacco Use: Medium Risk (11/14/2022)    Readmission Risk Interventions     No data to display

## 2023-01-18 NOTE — ED Notes (Addendum)
Pt c/o left sided tooth pain. Asking for ibuprofen. Received both tylenol and ibuprofen yesterday for pain. MD notified.

## 2023-01-18 NOTE — ED Notes (Signed)
Pt ivc/pending consult  

## 2023-01-18 NOTE — TOC Progression Note (Signed)
Transition of Care [facility]) - Progression Note    Patient Details  Name: Crystal [name] MRN: [id number] Date of Birth: Nov 23, 1990  Transition of Care [facility]) CM/SW Contact  Darleene Cleaver, [place] Phone Number: 01/17/2023, 5:00pm  Clinical Narrative:     CSW received message that [facility] came to evaluate patient over the weekend and needed updated clinicals.  CSW to follow up with family care home.  Expected Discharge Plan: Group Home Barriers to Discharge:  (pending group home acceptance back)  Expected Discharge Plan and Services                                               Social Determinants of Health (SDOH) Interventions SDOH Screenings   Alcohol Screen: Low Risk  (11/11/2020)  Depression (PHQ2-9): Low Risk  (03/17/2022)  Tobacco Use: Medium Risk (11/14/2022)    Readmission Risk Interventions     No data to display

## 2023-01-19 NOTE — ED Notes (Signed)
Pt given shower supplies and shower room unlocked.  

## 2023-01-19 NOTE — ED Notes (Signed)
Pt provided with breakfast tray and juice  

## 2023-01-19 NOTE — ED Notes (Signed)
Vol  pending  TOC  placement 

## 2023-01-19 NOTE — Progress Notes (Signed)
   01/19/23 1700  Spiritual Encounters  Referral source Patient request  Reason for visit Routine spiritual support  OnCall Visit Yes   Chaplain responded to patient request for visit. Chaplain provided a compassionate presence and reflective listening while challenging the patient to consider kindness instead of acts of aggression. Patient requested words of familiar songs for her to sing.

## 2023-01-19 NOTE — ED Provider Notes (Signed)
Emergency Medicine Observation Re-evaluation Note  Crystal [name] is a 32 y.o. female, seen on rounds today.  Pt initially presented to the ED for complaints of Back Pain, suicidal ideation, and Homicidal  Currently, the patient is is no acute distress. Denies any concerns at this time.  Physical Exam  Blood pressure (!) 118/93, pulse 68, temperature 97.7 F (36.5 C), temperature source Oral, resp. rate 17, height 5\' 6"  (1.676 m), weight 90.9 kg, SpO2 99 %.  Physical Exam: General: No apparent distress Pulm: Normal WOB Neuro: Moving all extremities Psych: Resting comfortably     ED Course / MDM     I have reviewed the labs performed to date as well as medications administered while in observation.  Recent changes in the last 24 hours include: No acute events overnight.  Plan   Current plan: Patient awaiting SW disposition. Patient is not under full IVC at this time.    Corena Herter, MD 01/19/23 [phone]

## 2023-01-19 NOTE — ED Notes (Signed)
Pt given dinner tray and beverage  

## 2023-01-19 NOTE — ED Notes (Signed)
Gave pt snack and water  

## 2023-01-19 NOTE — ED Notes (Signed)
Lunch tray and water provided in pt room

## 2023-01-20 DIAGNOSIS — Z85828 Personal history of other malignant neoplasm of skin: Secondary | ICD-10-CM | POA: Diagnosis not present

## 2023-01-20 DIAGNOSIS — Z21 Asymptomatic human immunodeficiency virus [HIV] infection status: Secondary | ICD-10-CM | POA: Diagnosis not present

## 2023-01-20 DIAGNOSIS — R45851 Suicidal ideations: Secondary | ICD-10-CM | POA: Diagnosis not present

## 2023-01-20 DIAGNOSIS — Z20822 Contact with and (suspected) exposure to covid-19: Secondary | ICD-10-CM | POA: Diagnosis not present

## 2023-01-20 DIAGNOSIS — F4325 Adjustment disorder with mixed disturbance of emotions and conduct: Secondary | ICD-10-CM | POA: Diagnosis not present

## 2023-01-20 DIAGNOSIS — F329 Major depressive disorder, single episode, unspecified: Secondary | ICD-10-CM | POA: Diagnosis not present

## 2023-01-20 DIAGNOSIS — Z87891 Personal history of nicotine dependence: Secondary | ICD-10-CM | POA: Diagnosis not present

## 2023-01-20 DIAGNOSIS — F840 Autistic disorder: Secondary | ICD-10-CM | POA: Diagnosis not present

## 2023-01-20 NOTE — ED Notes (Signed)
Patient is at the door, asking questions over and over about medications and when she will be leaving nurse let her know that it would be when ever there is a bed available.

## 2023-01-20 NOTE — ED Notes (Signed)
vol/toc.... 

## 2023-01-20 NOTE — ED Notes (Signed)
Patient is laying in the floor putting a puzzle together, she is pleasant, cooperative and calm.

## 2023-01-20 NOTE — ED Notes (Signed)
Patient is calm, cooperative, ate her supper, no signs of distress, has been drawing and putting puzzles together.

## 2023-01-20 NOTE — ED Notes (Signed)
Pt provided snack.

## 2023-01-20 NOTE — ED Notes (Signed)
Hospital meal provided, pt tolerated w/o complaints.  Waste discarded appropriately.  

## 2023-01-20 NOTE — ED Provider Notes (Signed)
Emergency Medicine Observation Re-evaluation Note  Crystal [name] is a 32 y.o. female, seen on rounds today.  Pt initially presented to the ED for complaints of Back Pain, suicidal ideation, and Homicidal No change overnight  Physical Exam  BP 102/85   Pulse 97   Temp 98.4 F (36.9 C)   Resp 16   Ht 1.676 m (5\' 6" )   Wt 90.9 kg   SpO2 95%   BMI 32.35 kg/m  Physical Exam General: No distress  Lungs: No increased work breathing Psych: Calm, no threatening behavior at this time  ED Course / MDM    Plan  Pending TOC disposition    Jene Every, MD 01/20/23 [phone]

## 2023-01-20 NOTE — ED Notes (Signed)
VOL/TOC Placement 

## 2023-01-21 DIAGNOSIS — F4325 Adjustment disorder with mixed disturbance of emotions and conduct: Secondary | ICD-10-CM | POA: Diagnosis not present

## 2023-01-21 NOTE — ED Notes (Signed)
Pt brought dirty linen to door requesting clean, provided to pt

## 2023-01-21 NOTE — ED Notes (Signed)
Pt given a snack and water; no other needs voiced at this time.

## 2023-01-21 NOTE — ED Notes (Signed)
Patient has h/o tachycardia in the morning without symptoms. Patient's heart rate currently high (126), patient denies any symptoms or feeling anxious. Patient's HR to be rechecked after shower.

## 2023-01-21 NOTE — ED Notes (Signed)
VOL TOC placement 

## 2023-01-21 NOTE — ED Provider Notes (Signed)
Emergency Medicine Observation Re-evaluation Note  Crystal [name] is a 32 y.o. female, seen on rounds today.  Pt initially presented to the ED for complaints of Back Pain, suicidal ideation, and Homicidal  Currently, the patient is no acute distress- no issues per bhu nurse-  pt resting in bed   Physical Exam  Blood pressure 112/78, pulse 74, temperature 98.2 F (36.8 C), temperature source Oral, resp. rate 17, height 5\' 6"  (1.676 m), weight 90.9 kg, SpO2 98 %.  Physical Exam General: No apparent distress Pulm: Normal WOB Psych: resting     ED Course / MDM     I have reviewed the labs performed to date as well as medications administered while in observation.  Recent changes in the last 24 hours include none  Plan   Current plan is to continue to wait for SW Patient is not under full IVC at this time.   Concha Se, MD 01/21/23 [phone]

## 2023-01-22 DIAGNOSIS — F4325 Adjustment disorder with mixed disturbance of emotions and conduct: Secondary | ICD-10-CM | POA: Diagnosis not present

## 2023-01-22 NOTE — ED Notes (Signed)
Report given to Keyri, RN 

## 2023-01-22 NOTE — ED Notes (Signed)
Pt given snack and water. No other needs voiced at this time. 

## 2023-01-22 NOTE — ED Notes (Signed)
VOL TOC placement 

## 2023-01-22 NOTE — ED Notes (Signed)
Pt. given breakfast tray and juice. No other needs voiced at this time.  

## 2023-01-22 NOTE — TOC Progression Note (Signed)
Transition of Care [facility]) - Progression Note    Patient Details  Name: Crystal [name] MRN: [id number] Date of Birth: 01-22-1991  [facility] of [facility]) CM/SW Contact  Halford Chessman Phone Number: 01/22/2023, 7:22 PM  Clinical Narrative:     CSW sent secure email to Denton Meek from [facility], requesting updates on placement.  Awaiting for a response back.   Per Email from 4/29: "[name] has started the paperwork for placement. [name] has a group home in [facility].  [facility] is a [facility] placement and locating a home for placement."  TOC to continue to follow patient's progress throughout discharge planning.    Expected Discharge Plan: Group Home Barriers to Discharge:  (pending group home acceptance back)  Expected Discharge Plan and Services                                               Social Determinants of Health (SDOH) Interventions SDOH Screenings   Alcohol Screen: Low Risk  (11/11/2020)  Depression (PHQ2-9): Low Risk  (03/17/2022)  Tobacco Use: Medium Risk (11/14/2022)    Readmission Risk Interventions     No data to display

## 2023-01-22 NOTE — ED Notes (Signed)
Pt in room, reading a book. No needs expressed at this time

## 2023-01-22 NOTE — ED Provider Notes (Signed)
Emergency Medicine Observation Re-evaluation Note  Physical Exam   BP 110/77 (BP Location: Right Arm)   Pulse 97   Temp 98 F (36.7 C) (Oral)   Resp 16   Ht 5\' 6"  (1.676 m)   Wt 90.9 kg   SpO2 96%   BMI 32.35 kg/m   Patient appears in no acute distress.  ED Course / MDM   No reported events during my shift at the time of this note.   Pt is awaiting dispo from SW   Pilar Jarvis MD    Pilar Jarvis, MD 01/22/23 [phone]

## 2023-01-22 NOTE — ED Notes (Signed)
Vital signs obtained

## 2023-01-23 DIAGNOSIS — F4325 Adjustment disorder with mixed disturbance of emotions and conduct: Secondary | ICD-10-CM | POA: Diagnosis not present

## 2023-01-23 NOTE — ED Notes (Signed)
Patient requested a shower which was provided at this time.

## 2023-01-23 NOTE — ED Notes (Signed)
Patient wanted cranberry juice instead of milk.

## 2023-01-23 NOTE — ED Notes (Signed)
Report given to Dawn, RN.  

## 2023-01-23 NOTE — ED Provider Notes (Signed)
Emergency Medicine Observation Re-evaluation Note  Crystal [name] is a 32 y.o. female, seen on rounds today.  Pt initially presented to the ED for complaints of Back Pain, suicidal ideation, and Homicidal Currently, the patient is resting comfortably in her room.  Physical Exam  BP 112/70 (BP Location: Left Arm)   Pulse 82   Temp 98 F (36.7 C) (Oral)   Resp 18   Ht 5\' 6"  (1.676 m)   Wt 90.9 kg   SpO2 97%   BMI 32.35 kg/m  Physical Exam  Currently the patient is comfortable appearing and in no acute distress.  ED Course / MDM   There have been no significant changes to the patient's status in the last 24 hours  Plan  Current plan is for TOC disposition.    Dionne Bucy, MD 01/23/23 [phone]

## 2023-01-23 NOTE — ED Notes (Signed)
Patient was given graham crackers as a snack along with peanut butter.

## 2023-01-23 NOTE — ED Notes (Signed)
Snack time given. 

## 2023-01-23 NOTE — ED Notes (Signed)
Lunch tray given. 

## 2023-01-23 NOTE — ED Notes (Signed)
This tech obtained pt vital signs. 

## 2023-01-23 NOTE — ED Notes (Signed)
Pt denies SI/HI/AVH on assessment 

## 2023-01-23 NOTE — ED Notes (Signed)
VOL/TOC Placement 

## 2023-01-23 NOTE — ED Notes (Signed)
Pt given snack and beverage. 

## 2023-01-23 NOTE — ED Notes (Signed)
Patient requested for ice water at this time.

## 2023-01-24 DIAGNOSIS — F4325 Adjustment disorder with mixed disturbance of emotions and conduct: Secondary | ICD-10-CM | POA: Diagnosis not present

## 2023-01-24 NOTE — ED Notes (Addendum)
Breakfast tray given and hygiene products when she finished eating

## 2023-01-24 NOTE — ED Notes (Signed)
Pt given nighttime snack. 

## 2023-01-24 NOTE — ED Notes (Signed)
Patient is vol pending TOC placement 

## 2023-01-24 NOTE — ED Notes (Signed)
Patient is vol pending D/C on Monday

## 2023-01-24 NOTE — ED Notes (Signed)
Dinner tray given

## 2023-01-24 NOTE — ED Notes (Signed)
Pt exits restroom with a piece of toilet paper and appears to be blood on it. Pt states she believes she vomits blood in restroom but flushed it before she exited. Pt is poor historian and when asked how long she has been vomiting she states she isn't sure. Pt denies abdominal pain, this nurse was unable to visualize any emesis. Message to Dr. Dolores Frame at this time to update.

## 2023-01-24 NOTE — ED Notes (Signed)
Lunch tray given. 

## 2023-01-25 DIAGNOSIS — F4325 Adjustment disorder with mixed disturbance of emotions and conduct: Secondary | ICD-10-CM | POA: Diagnosis not present

## 2023-01-25 MED ORDER — ALUM & MAG HYDROXIDE-SIMETH 200-200-20 MG/5ML PO SUSP
30.0000 mL | Freq: Once | ORAL | Status: AC
  Administered 2023-01-25: 30 mL via ORAL
  Filled 2023-01-25: qty 30

## 2023-01-25 MED ORDER — LIDOCAINE VISCOUS HCL 2 % MT SOLN
15.0000 mL | Freq: Once | OROMUCOSAL | Status: AC
  Administered 2023-01-25: 15 mL via OROMUCOSAL
  Filled 2023-01-25: qty 15

## 2023-01-25 NOTE — ED Provider Notes (Signed)
Emergency Medicine Observation Re-evaluation Note  Crystal [name] is a 32 y.o. female, seen on rounds today.  Pt initially presented to the ED for complaints of Back Pain, suicidal ideation, and Homicidal Currently, the patient is sleeping comfortably in her room.  Physical Exam  BP 100/62 (BP Location: Left Arm)   Pulse 71   Temp 97.8 F (36.6 C) (Oral)   Resp 16   Ht 5\' 6"  (1.676 m)   Wt 90.9 kg   SpO2 98%   BMI 32.35 kg/m  Physical Exam  The patient is comfortable appearing and in no acute distress.    ED Course / MDM  EKG:   I have reviewed the labs performed to date as well as medications administered while in observation.  Recent changes in the last 24 hours include none.  Plan  Current plan is for dispo per social work.    Chesley Noon, MD 01/25/23 [phone]

## 2023-01-25 NOTE — ED Notes (Signed)
Breakfast tray given. °

## 2023-01-25 NOTE — ED Notes (Signed)
Tech obtained vital signs on pt.  

## 2023-01-25 NOTE — ED Notes (Signed)
Pt given snack and beverage. 

## 2023-01-25 NOTE — TOC Progression Note (Signed)
Transition of Care [facility]) - Progression Note    Patient Details  Name: Crystal [name] MRN: [id number] Date of Birth: [birth year]  Transition of Care [facility]) CM/SW Contact  Halford Chessman Phone Number: 01/25/2023, 6:32 PM  Clinical Narrative:    Per [facility], they are still working on placement. Shanita Lovelace from independent group home living has completed the PCP and is awaiting signature by physician in the Guardian to be signed.  TOC to continue to follow patient's progress throughout discharge planning.  Team meeting on Wednesday at 11am.    Expected Discharge Plan: Group Home Barriers to Discharge:  (pending group home acceptance back)  Expected Discharge Plan and Services                                               Social Determinants of Health (SDOH) Interventions SDOH Screenings   Alcohol Screen: Low Risk  (11/11/2020)  Depression (PHQ2-9): Low Risk  (03/17/2022)  Tobacco Use: Medium Risk (11/14/2022)    Readmission Risk Interventions     No data to display

## 2023-01-25 NOTE — ED Notes (Signed)
Patient is taking a shower at this time. 

## 2023-01-25 NOTE — ED Notes (Signed)
Patient received a snack at this time.

## 2023-01-25 NOTE — ED Notes (Signed)
VOL/TOC Placement 

## 2023-01-26 DIAGNOSIS — F4325 Adjustment disorder with mixed disturbance of emotions and conduct: Secondary | ICD-10-CM | POA: Diagnosis not present

## 2023-01-26 NOTE — ED Provider Notes (Signed)
Emergency Medicine Observation Re-evaluation Note  Crystal [name] is a 31 y.o. female, seen on rounds today.  Pt initially presented to the ED for complaints of Back Pain, suicidal ideation, and Homicidal Currently, the patient is resting comfortably in bed.  Physical Exam  BP 124/79 (BP Location: Left Arm)   Pulse 82   Temp 98 F (36.7 C) (Oral)   Resp 20   Ht 5\' 6"  (1.676 m)   Wt 90.9 kg   SpO2 97%   BMI 32.35 kg/m  Physical Exam General: Resting in bed, no reported overnight issues per nursing  ED Course / MDM  No changes over the past 24 hours  Plan  Current plan is for TOC placement.    Trinna Post, MD 01/26/23 [phone]

## 2023-01-26 NOTE — ED Notes (Signed)
Pt vomiting, with pts explanation it appears she has indegestion/reflux after she had her snack of 1/2 ham sandwich and apple sauce. PRN collected

## 2023-01-26 NOTE — ED Notes (Signed)
Snack provided to pt at this time 

## 2023-01-26 NOTE — ED Notes (Signed)
vol/toc.... 

## 2023-01-26 NOTE — ED Notes (Signed)
Breakfast tray and juice provided 

## 2023-01-26 NOTE — ED Notes (Signed)
Pt lunch tray and water provided

## 2023-01-27 DIAGNOSIS — F4325 Adjustment disorder with mixed disturbance of emotions and conduct: Secondary | ICD-10-CM | POA: Diagnosis not present

## 2023-01-27 NOTE — ED Notes (Signed)
pt recieved snack and drink 

## 2023-01-27 NOTE — ED Notes (Signed)
Snack was given. 

## 2023-01-27 NOTE — TOC Progression Note (Signed)
Transition of Care [facility]) - Progression Note    Patient Details  Name: Crystal [name] MRN: [id number] Date of Birth: Jun 20, 1991  Transition [facility] Care [facility]) CM/SW Contact  Halford Chessman Phone Number: 01/27/2023, 9:02 AM  Clinical Narrative:     CSW received phone call from [place] at [facility] confirming there is a team meeting via teams at 11am today.  Expected Discharge Plan: Group Home Barriers to Discharge:  (pending group home acceptance back)  Expected Discharge Plan and Services                                               Social Determinants of Health (SDOH) Interventions SDOH Screenings   Alcohol Screen: Low Risk  (11/11/2020)  Depression (PHQ2-9): Low Risk  (03/17/2022)  Tobacco Use: Medium Risk (11/14/2022)    Readmission Risk Interventions     No data to display

## 2023-01-27 NOTE — ED Notes (Signed)
Patient ate 100% of breakfast, no signs of disterss.

## 2023-01-27 NOTE — ED Notes (Signed)
Vol  pending  TOC  placement 

## 2023-01-27 NOTE — ED Notes (Signed)
Patient is taking shower at this time, she is pleasant, no behavioral issues noted, staff will continue to monitor.

## 2023-01-27 NOTE — ED Notes (Signed)
Patient is having interview with group home at this time per Minerva Areola Chief Strategy Officer) [employer], Patient is pleasant and cooperative.

## 2023-01-27 NOTE — ED Notes (Signed)
VOL/Still Pending TOC Placement 

## 2023-01-28 DIAGNOSIS — F4325 Adjustment disorder with mixed disturbance of emotions and conduct: Secondary | ICD-10-CM | POA: Diagnosis not present

## 2023-01-28 NOTE — ED Notes (Signed)
Dinner tray given

## 2023-01-28 NOTE — ED Notes (Signed)
vol/toc.... 

## 2023-01-28 NOTE — ED Notes (Signed)
Report given to Kim RN.

## 2023-01-28 NOTE — ED Provider Notes (Signed)
Emergency Medicine Observation Re-evaluation Note  Crystal [name] is a 32 y.o. female, seen on rounds today.  Pt initially presented to the ED for complaints of Back Pain, suicidal ideation, and Homicidal  Currently, the patient is resting in bed- no issues per [facility] nurse   Physical Exam  Blood pressure 97/74, pulse 97, temperature 98.6 F (37 C), temperature source Oral, resp. rate 18, height 5\' 6"  (1.676 m), weight 90.9 kg, SpO2 95 %.  Physical Exam General: No apparent distress Pulm: Normal WOB Psych: resting     ED Course / MDM     I have reviewed the labs performed to date- Recent changes in the last 24 hours include none   Plan   Current plan is to continue to wait for SW Patient is not under full IVC at this time.   Concha Se, MD 01/28/23 [phone]

## 2023-01-28 NOTE — ED Notes (Signed)
Report received from Nadia RN. Patient care assumed. Patient/RN introduction complete. Will continue to monitor.  

## 2023-01-29 DIAGNOSIS — F4325 Adjustment disorder with mixed disturbance of emotions and conduct: Secondary | ICD-10-CM | POA: Diagnosis not present

## 2023-01-29 NOTE — ED Provider Notes (Signed)
Emergency Medicine Observation Re-evaluation Note  Crystal [name] is a 32 y.o. female, seen on rounds today.  Pt initially presented to the ED for complaints of Back Pain, suicidal ideation, and Homicidal   Physical Exam  BP 111/73 (BP Location: Left Arm)   Pulse (!) 102   Temp 98.4 F (36.9 C) (Oral)   Resp 19   Ht 5\' 6"  (1.676 m)   Wt 90.9 kg   SpO2 94%   BMI 32.35 kg/m  Physical Exam General: nad  ED Course / MDM  EKG:   I have reviewed the labs performed to date as well as medications administered while in observation.    Plan  Current plan is for psych/sw.    Willy Eddy, MD 01/29/23 [phone]

## 2023-01-29 NOTE — ED Notes (Signed)
Pt doing puzzle in room

## 2023-01-29 NOTE — ED Notes (Addendum)
Dinner given by tech 

## 2023-01-29 NOTE — ED Notes (Signed)
Given dinner and drink 

## 2023-01-29 NOTE — ED Notes (Signed)
Patient took all of her medications, she is oriented, no signs of distress, patient denies Si/hi or avh, staff will continue to monitor for safety.

## 2023-01-30 DIAGNOSIS — F4325 Adjustment disorder with mixed disturbance of emotions and conduct: Secondary | ICD-10-CM | POA: Diagnosis not present

## 2023-01-30 NOTE — ED Provider Notes (Signed)
Emergency Medicine Observation Re-evaluation Note  Crystal [name] is a 32 y.o. female, seen on rounds today.  Pt initially presented to the ED for complaints of Back Pain, suicidal ideation, and Homicidal Currently, the patient is sleeping in bed, no issues overnight.  Physical Exam  BP 117/79 (BP Location: Right Arm)   Pulse 72   Temp 98.2 F (36.8 C) (Oral)   Resp 20   Ht 5\' 6"  (1.676 m)   Wt 90.9 kg   SpO2 98%   BMI 32.35 kg/m  Physical Exam Constitutional: Resting comfortably. Eyes: Conjunctivae are normal. Head: Atraumatic. Nose: No congestion/rhinnorhea. Mouth/Throat: Mucous membranes are moist. Neck: Normal ROM Cardiovascular: No cyanosis noted. Respiratory: Normal respiratory effort. Gastrointestinal: Non-distended. Genitourinary: deferred Musculoskeletal: No lower extremity tenderness nor edema. Neurologic:  Normal speech and language. No gross focal neurologic deficits are appreciated. Skin:  Skin is warm, dry and intact. No rash noted.   ED Course / MDM  EKG:   I have reviewed the labs performed to date as well as medications administered while in observation.  Recent changes in the last 24 hours include none.  Plan  Current plan is for dispo per social work.    Chesley Noon, MD 01/30/23 [phone]

## 2023-01-30 NOTE — ED Notes (Signed)
Hospital meal provided, pt tolerated w/o complaints.  Waste discarded appropriately.  

## 2023-01-30 NOTE — ED Notes (Signed)
Vol  pending  TOC  placement 

## 2023-01-30 NOTE — ED Notes (Signed)
Pt taking shower. Pt was given hygiene items and the following, 1 clean top, 1 clean bottom, with 1 pair of disposable underwear.  Pt changed out into clean clothing.  Staff disposed of all shower supplies.   

## 2023-01-30 NOTE — ED Notes (Signed)
Snack in foam cups with eco safe spoons provided to pt along with fresh water in a foam cup. Pt informed no other snacks will be given until breakfast time. Pt also instructed to take the snack provided to her room for consumption. Snack consumed and disposed of properly.   Pt asked for 4 sheets of blank paper after obtaining snack. This request was on top of of the 5 papers provided prior to pt receiving snack. EDT offered only 2 new blank sheets of paper and informed pt that non more would be provided. Pt made aware blank paper has a front and a back for added writing space. Pt accepted 2 sheets of blank paper and expressed understanding by stating "Okay."

## 2023-01-31 DIAGNOSIS — F4325 Adjustment disorder with mixed disturbance of emotions and conduct: Secondary | ICD-10-CM | POA: Diagnosis not present

## 2023-01-31 NOTE — ED Notes (Signed)
Hospital meal provided, pt tolerated w/o complaints.  Waste discarded appropriately.  

## 2023-01-31 NOTE — ED Notes (Signed)
This tech obtained pt vitals.  

## 2023-01-31 NOTE — ED Notes (Signed)
Patient is vol pending TOC placement 

## 2023-01-31 NOTE — ED Notes (Signed)
VoL pending placement 

## 2023-01-31 NOTE — ED Notes (Signed)
Lunch tray given. 

## 2023-02-01 DIAGNOSIS — F4325 Adjustment disorder with mixed disturbance of emotions and conduct: Secondary | ICD-10-CM | POA: Diagnosis not present

## 2023-02-01 NOTE — ED Notes (Signed)
vol/toc placement.. 

## 2023-02-01 NOTE — ED Notes (Signed)
Snack given with water.  

## 2023-02-01 NOTE — TOC Progression Note (Signed)
Transition of Care [facility]) - Progression Note    Patient Details  Name: Crystal [name] MRN: [id number] Date of Birth: 1991/01/24  Transition of Care [facility]) CM/SW Contact  Halford Chessman Phone Number: 02/01/2023, 6:24 PM  Clinical Narrative:     No updates from [facility] at this time, waiting for final paperwork to get a discharge date figured out.  Expected Discharge Plan: Group Home Barriers to Discharge:  (pending group home acceptance back)  Expected Discharge Plan and Services                                               Social Determinants of Health (SDOH) Interventions SDOH Screenings   Alcohol Screen: Low Risk  (11/11/2020)  Depression (PHQ2-9): Low Risk  (03/17/2022)  Tobacco Use: Medium Risk (11/14/2022)    Readmission Risk Interventions     No data to display

## 2023-02-01 NOTE — ED Notes (Signed)
VOL/TOC Placement 

## 2023-02-01 NOTE — ED Provider Notes (Signed)
Emergency Medicine Observation Re-evaluation Note  Crystal [name] is a 32 y.o. female, seen on rounds today.  Pt initially presented to the ED for complaints of Back Pain, suicidal ideation, and Homicidal Currently, the patient is resting in bed. No reported issues overnight from nursing team.   Physical Exam  BP 122/74 (BP Location: Left Arm)   Pulse 74   Temp 98 F (36.7 C) (Oral)   Resp 20   Ht 5\' 6"  (1.676 m)   Wt 90.9 kg   SpO2 98%   BMI 32.35 kg/m  Physical Exam General: Resting comfortably in bed  ED Course / MDM  EKG:   I have reviewed the labs and medications over the past 24 hours. Recent changes in the last 24 hours are none.    Plan  Current plan is for dispo per social work.    Trinna Post, MD 02/01/23 [phone]

## 2023-02-01 NOTE — ED Notes (Signed)
Hospital meal provided, pt tolerated w/o complaints.  Waste discarded appropriately.  

## 2023-02-02 DIAGNOSIS — F4325 Adjustment disorder with mixed disturbance of emotions and conduct: Secondary | ICD-10-CM | POA: Diagnosis not present

## 2023-02-02 NOTE — ED Provider Notes (Signed)
Emergency Medicine Observation Re-evaluation Note  Crystal [name] is a 32 y.o. female, seen on rounds today.  Pt initially presented to the ED for complaints of Back Pain, suicidal ideation, and Homicidal  Currently, the patient is calm, no acute complaints.  Physical Exam  Blood pressure 112/65, pulse 74, temperature 97.8 F (36.6 C), temperature source Oral, resp. rate 16, height 5\' 6"  (1.676 m), weight 90.9 kg, SpO2 97 %. Physical Exam General: NAD Lungs: CTAB Psych: not agitated  ED Course / MDM  EKG:    I have reviewed the labs performed to date as well as medications administered while in observation.  Recent changes in the last 24 hours include no acute events overnight.    Plan  Current plan is for TOC placement.    Sharman Cheek, MD 02/02/23 [phone]

## 2023-02-02 NOTE — TOC Progression Note (Signed)
Transition of Care [facility]) - Progression Note    Patient Details  Name: Crystal [name] MRN: [id number] Date of Birth: 1991/09/07  Transition of Care [facility]) CM/SW Contact  Darleene Cleaver, [place] Phone Number: 02/02/2023, 9:04 PM  Clinical Narrative:     Emailed [facility] to see if they have determined a date for when patient can be discharged, waiting for email back.  Expected Discharge Plan: Group Home Barriers to Discharge:  (pending group home acceptance back)  Expected Discharge Plan and Services  Group Home                                              Social Determinants of Health (SDOH) Interventions SDOH Screenings   Alcohol Screen: Low Risk  (11/11/2020)  Depression (PHQ2-9): Low Risk  (03/17/2022)  Tobacco Use: Medium Risk (11/14/2022)    Readmission Risk Interventions     No data to display

## 2023-02-02 NOTE — ED Notes (Signed)
This NT provided pt with pm snack. 

## 2023-02-02 NOTE — ED Notes (Signed)
Dinner tray given

## 2023-02-02 NOTE — ED Notes (Signed)
Pt given cup of water with ice as requested.

## 2023-02-02 NOTE — ED Notes (Signed)
Pt received snack and water. 

## 2023-02-02 NOTE — ED Notes (Signed)
Vol /pending placement 

## 2023-02-02 NOTE — ED Notes (Signed)
Pt received lunch tray and drink.  

## 2023-02-03 DIAGNOSIS — F4325 Adjustment disorder with mixed disturbance of emotions and conduct: Secondary | ICD-10-CM | POA: Diagnosis not present

## 2023-02-03 NOTE — ED Notes (Signed)
Patient took shower this am, she is pleasant and cooperative, no signs of distress, no behavioral issues, Patient denies Si/hi or avh, will continue to monitor for safety.

## 2023-02-03 NOTE — ED Notes (Addendum)
Patient without signs of distress.

## 2023-02-03 NOTE — ED Notes (Signed)
Pt given shower supplies and clean clothes. Pt out of shower at this time. Pt ate 100% of breakfast tray.

## 2023-02-03 NOTE — ED Notes (Signed)
Patient ate 100% of dinner and beverage, Patient is cooperative and calm, asking for Bible. Candelaria Stagers was paged to come see her also.

## 2023-02-03 NOTE — ED Notes (Signed)
Vol  pending  TOC  placement 

## 2023-02-03 NOTE — ED Notes (Signed)
Fresh ice water in foam cup provided to pt per request.

## 2023-02-03 NOTE — ED Provider Notes (Signed)
Emergency Medicine Observation Re-evaluation Note  Crystal [name] is a 32 y.o. female, seen on rounds today.  Pt initially presented to the ED for complaints of Back Pain, suicidal ideation, and Homicidal Currently, the patient is resting comfortably.  Physical Exam  BP 101/76 (BP Location: Right Arm)   Pulse 76   Temp 97.7 F (36.5 C) (Oral)   Resp 17   Ht 5\' 6"  (1.676 m)   Wt 90.9 kg   SpO2 98%   BMI 32.35 kg/m  Physical Exam General: No acute distress Cardiac: Well-perfused extremities Lungs: No respiratory distress Psych: Appropriate mood and affect  ED Course / MDM  EKG:   I have reviewed the labs performed to date as well as medications administered while in observation.  Recent changes in the last 24 hours include none.  Plan  Current plan is for placement.    Merwyn Katos, MD 02/03/23 [phone]

## 2023-02-03 NOTE — ED Notes (Signed)

## 2023-02-03 NOTE — ED Notes (Signed)
Snack provided

## 2023-02-04 DIAGNOSIS — F4325 Adjustment disorder with mixed disturbance of emotions and conduct: Secondary | ICD-10-CM | POA: Diagnosis not present

## 2023-02-04 NOTE — ED Notes (Signed)
When rounding on patient, writer had observed damaged where molding was peeled off the wall. Writer asked what happened and patient explained when cleaning near the molding, the molding had just come off the wall. RN notified

## 2023-02-04 NOTE — ED Notes (Signed)
vol/toc.... 

## 2023-02-04 NOTE — ED Notes (Signed)
Vol  Toc  placement 

## 2023-02-04 NOTE — ED Provider Notes (Signed)
Emergency Medicine Observation Re-evaluation Note  Rhodes Borunda is a 32 y.o. female, seen on rounds today.  Pt initially presented to the ED for complaints of Back Pain, suicidal ideation, and Homicidal Currently, the patient is resting comfortably.  Physical Exam  BP 127/84 (BP Location: Right Arm)   Pulse 81   Temp 98.5 F (36.9 C) (Oral)   Resp 18   Ht 5\' 6"  (1.676 m)   Wt 90.9 kg   SpO2 96%   BMI 32.35 kg/m  Physical Exam General: No acute distress Cardiac: Well-perfused extremities Lungs: No respiratory distress Psych: Appropriate mood and affect  ED Course / MDM  EKG:   I have reviewed the labs performed to date as well as medications administered while in observation.  Recent changes in the last 24 hours include none.  Plan  Current plan is for placement.    Merwyn Katos, MD 02/04/23 [phone]

## 2023-02-04 NOTE — ED Notes (Signed)
Pt was given bagel with peanut butter and water for snack

## 2023-02-04 NOTE — TOC Progression Note (Addendum)
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 T  hey were able to receive all documents needed to move forward on the innovation process.    They have sent the LOC to be signed by the psychologist.  Once he signs the LOC it will be sent to [facility] for innovation approval. They are almost completed with the process.  CSW has Database administrator today asking for any further updates, pending response.    Expected Discharge Plan: Group Home Barriers to Discharge:  (pending group home acceptance back)  Expected Discharge Plan and Services                                               Social Determinants of Health (SDOH) Interventions SDOH Screenings   Alcohol Screen: Low Risk  (11/11/2020)  Depression (PHQ2-9): Low Risk  (03/17/2022)  Tobacco Use: Medium Risk (11/14/2022)    Readmission Risk Interventions     No data to display

## 2023-02-05 DIAGNOSIS — F4325 Adjustment disorder with mixed disturbance of emotions and conduct: Secondary | ICD-10-CM | POA: Diagnosis not present

## 2023-02-05 NOTE — ED Notes (Signed)
VOL/TOC Placement 

## 2023-02-05 NOTE — ED Notes (Signed)
Pt given breakfast tray and juice. 

## 2023-02-05 NOTE — ED Provider Notes (Signed)
Emergency Medicine Observation Re-evaluation Note  Crystal [name] is a 32 y.o. female, seen on rounds today.  Pt initially presented to the ED for complaints of Back Pain, suicidal ideation, and Homicidal Currently, the patient is resting comfortably.  Physical Exam  BP 113/75   Pulse 70   Temp 97.9 F (36.6 C) (Oral)   Resp 18   Ht 5\' 6"  (1.676 m)   Wt 90.9 kg   SpO2 98%   BMI 32.35 kg/m  Physical Exam General: No acute distress Cardiac: Well-perfused extremities Lungs: No respiratory distress Psych: Appropriate mood and affect  ED Course / MDM  EKG:   I have reviewed the labs performed to date as well as medications administered while in observation.  Recent changes in the last 24 hours include none.  Plan  Current plan is for placement.    Merwyn Katos, MD 02/05/23 [phone]

## 2023-02-05 NOTE — ED Notes (Signed)
Pt given shower supplies and clean scrubs. Pt showered and back in room with no other needs at this time. 

## 2023-02-05 NOTE — ED Notes (Signed)
VOL/ TOC 

## 2023-02-06 DIAGNOSIS — F4325 Adjustment disorder with mixed disturbance of emotions and conduct: Secondary | ICD-10-CM | POA: Diagnosis not present

## 2023-02-06 NOTE — ED Provider Notes (Signed)
Emergency Medicine Observation Re-evaluation Note  Crystal [name] is a 32 y.o. female, seen on rounds today.  Pt initially presented to the ED for complaints of Back Pain, suicidal ideation, and Homicidal  Currently, the patient is resting in bed. No reported issues overnight from nursing team.   Physical Exam  BP 111/67 (BP Location: Left Arm)   Pulse 96   Temp 98.3 F (36.8 C) (Oral)   Resp 20   Ht 5\' 6"  (1.676 m)   Wt 90.9 kg   SpO2 96%   BMI 32.35 kg/m  Physical Exam General: Resting comfortably in bed  ED Course / MDM  EKG:   I have reviewed the labs and medications over the past 24 hours. Recent changes in the last 24 hours are none.    Plan  Current plan is for dispo per social work TOC placement.    Trinna Post, MD 02/06/23 [phone]

## 2023-02-06 NOTE — ED Notes (Signed)
This NT provided pt with pm snack at this time. 

## 2023-02-06 NOTE — ED Notes (Signed)
Dinner tray given

## 2023-02-07 DIAGNOSIS — F4325 Adjustment disorder with mixed disturbance of emotions and conduct: Secondary | ICD-10-CM | POA: Diagnosis not present

## 2023-02-07 NOTE — ED Notes (Signed)
Pt given nighttime snack. 

## 2023-02-07 NOTE — ED Notes (Signed)
VOL/ TOC 

## 2023-02-07 NOTE — ED Notes (Signed)
Patient is vol pending TOC placement 

## 2023-02-07 NOTE — ED Provider Notes (Signed)
Emergency Medicine Observation Re-evaluation Note  Crystal [name] is a 32 y.o. female, seen on rounds today.  Pt initially presented to the ED for complaints of Back Pain, suicidal ideation, and Homicidal Currently, the patient is awaiting disposition.  Physical Exam  BP 115/75 (BP Location: Right Arm)   Pulse (!) 102   Temp 98.2 F (36.8 C) (Oral)   Resp 20   Ht 5\' 6"  (1.676 m)   Wt 90.9 kg   SpO2 97%   BMI 32.35 kg/m  Physical Exam General: calm  ED Course / MDM  No labs in past 24 hours  Plan  Awaiting disposition.    Phineas Semen, MD 02/07/23 [phone]

## 2023-02-08 DIAGNOSIS — F4325 Adjustment disorder with mixed disturbance of emotions and conduct: Secondary | ICD-10-CM | POA: Diagnosis not present

## 2023-02-08 NOTE — TOC Progression Note (Signed)
Transition of Care [facility]) - Progression Note    Patient Details  Name: Crystal [name] MRN: [id number] Date of Birth: 1991/07/15  Transition of Care [facility]) CM/SW Contact  Darleene Cleaver, [place] Phone Number: 02/08/2023, 5:44 PM  Clinical Narrative:     Emailed [facility] to find out update on status of paperwork being submitted for patient to go to group home.  Expected Discharge Plan: Group Home Barriers to Discharge:  (pending group home acceptance back)  Expected Discharge Plan and Services                                               Social Determinants of Health (SDOH) Interventions SDOH Screenings   Alcohol Screen: Low Risk  (11/11/2020)  Depression (PHQ2-9): Low Risk  (03/17/2022)  Tobacco Use: Medium Risk (11/14/2022)    Readmission Risk Interventions     No data to display

## 2023-02-08 NOTE — ED Notes (Signed)
Patient given shower supplies. Patient currently showering.

## 2023-02-08 NOTE — ED Notes (Signed)
Snack provided

## 2023-02-08 NOTE — ED Notes (Signed)
Pt given a piece of paper and a orange crayon.

## 2023-02-08 NOTE — ED Notes (Signed)
Pt given hospital provided dinner tray.

## 2023-02-08 NOTE — ED Notes (Signed)
Patient given breakfast tray.

## 2023-02-08 NOTE — ED Notes (Signed)
VOL Pending TOC placement 

## 2023-02-08 NOTE — ED Notes (Signed)
Lunch tray provided. 

## 2023-02-08 NOTE — ED Notes (Signed)
Pt to nurses station asking to use the restroom. Pt states she is starting to feel hopeless that she is never going to find a new place to live. Pt reports staying at the hospital and not having any updates makes her feel frustrated. Pt reminded that she had a whole team of people working on finding her a new home and she recently had a meeting where they updated her on their progress. Pt verbalized understanding and appears less anxious.

## 2023-02-09 DIAGNOSIS — F4325 Adjustment disorder with mixed disturbance of emotions and conduct: Secondary | ICD-10-CM | POA: Diagnosis not present

## 2023-02-09 MED ORDER — PROMETHAZINE HCL 25 MG PO TABS
25.0000 mg | ORAL_TABLET | Freq: Once | ORAL | Status: AC
  Administered 2023-03-01: 25 mg via ORAL
  Filled 2023-02-09: qty 1

## 2023-02-09 NOTE — TOC Progression Note (Addendum)
Transition of Care [facility]) - Progression Note    Patient Details  Name: Crystal [name] MRN: [id number] Date of Birth: Apr 09, 1991  Transition [facility] Care [facility]) CM/SW Contact  Darolyn Rua, [place] Phone Number: 02/09/2023, 11:10 AM  Clinical Narrative:     This CSW was assessing a patient in behavior unit when patient requested updates on her own placement.   CSW spoke with Kennyth Arnold with [facility] at [phone] she reports they have no new updates as they are waiting for paperwork to be completed by the group home by [facility] they are  unsure when this will occur.   CSW has lvm with Gardiner Fanti with [facility] [phone] requesting update on completion of paperwork for patient to transition to group home, as patient had requested an update. Pending call back  at this time.     Expected Discharge Plan: Group Home Barriers to Discharge:  (pending group home acceptance back)  Expected Discharge Plan and Services                                               Social Determinants of Health (SDOH) Interventions SDOH Screenings   Alcohol Screen: Low Risk  (11/11/2020)  Depression (PHQ2-9): Low Risk  (03/17/2022)  Tobacco Use: Medium Risk (11/14/2022)    Readmission Risk Interventions     No data to display

## 2023-02-09 NOTE — ED Notes (Signed)
Hospital meal provided, pt tolerated w/o complaints.  Waste discarded appropriately.  

## 2023-02-09 NOTE — ED Notes (Signed)
Pt vomitted on floor saying she couldn't make it to the bathroom. This tech cleaned up vomit, documented it, and gave patient paper container to throw up in.

## 2023-02-09 NOTE — ED Provider Notes (Signed)
Emergency Medicine Observation Re-evaluation Note  Crystal [name] is a 32 y.o. female, seen on rounds today.  Pt initially presented to the ED for complaints of Back Pain, suicidal ideation, and Homicidal Currently, the patient is resting comfortably.  Physical Exam  BP (!) 103/56 (BP Location: Left Arm)   Pulse 77   Temp 98.3 F (36.8 C)   Resp 18   Ht 5\' 6"  (1.676 m)   Wt 90.9 kg   SpO2 96%   BMI 32.35 kg/m  Physical Exam General: No acute distress Cardiac: Well-perfused extremities Lungs: No respiratory distress Psych: Appropriate mood and affect  ED Course / MDM  EKG:   I have reviewed the labs performed to date as well as medications administered while in observation.  Recent changes in the last 24 hours include none.  Plan  Current plan is for placement.    Merwyn Katos, MD 02/09/23 [phone]

## 2023-02-09 NOTE — ED Notes (Signed)
SNACKS GIVEN W/ WATER  

## 2023-02-09 NOTE — ED Notes (Signed)
Ginger ale given

## 2023-02-09 NOTE — ED Notes (Signed)
SHOWER SUPPLIES GIVEN.

## 2023-02-09 NOTE — ED Notes (Signed)
VOL  TOC  PLACEMENT 

## 2023-02-09 NOTE — ED Notes (Signed)
vol/toc.... 

## 2023-02-09 NOTE — ED Notes (Signed)
Snack was provided with soda. No other needs at this time   

## 2023-02-09 NOTE — Progress Notes (Signed)
This Chap visited Pt in response to page. Pt narrated her frustration on delay on the process of her placement. She lamented that not knowing what is happening as no one is updating her. This chap provided active listening and compassionate presence. Pt finally requested for some Music sheet prints.   02/09/23 1500  Spiritual Encounters  Type of Visit Initial  Care provided to: Patient  Conversation partners present during encounter Nurse  Referral source Patient request  Reason for visit Urgent spiritual support  OnCall Visit Yes  Spiritual Framework  Presenting Themes Meaning/purpose/sources of inspiration  Community/Connection Limited  Patient Stress Factors Lack of knowledge  Interventions  Spiritual Care Interventions Made Narrative/life review  Intervention Outcomes  Outcomes Awareness of support;Connection to spiritual care;Reduced isolation  Spiritual Care Plan  Spiritual Care Issues Still Outstanding No further spiritual care needs at this time (see row info)

## 2023-02-09 NOTE — ED Notes (Signed)
Patient on floor vomiting and shaking. Vitals obtained. States her throat is sore and her head is hot. MD Ray notified.

## 2023-02-09 NOTE — ED Notes (Signed)
BREAKFAST TRAY GIVEN W/ ORANGE JUICE

## 2023-02-09 NOTE — ED Notes (Signed)
Rn spoke with patient and encouraged patient to return to bed from floor. Vomited cleaned from floor by staff. New scrubs provided. Patient showed RN a piece of paper that she stated had the words " you are going to die tonight." No words wrote on paper upon observation. Patient states she doesn't know where she is or why someone would be out to get her. Writer ensured patient safety and provided emotional support. Patient requested PRN ativan for anxiety and accepted scheduled medications without issue. States she is feeling much better and refused nausea medications. Patient returned to room without issue.

## 2023-02-10 DIAGNOSIS — F4325 Adjustment disorder with mixed disturbance of emotions and conduct: Secondary | ICD-10-CM | POA: Diagnosis not present

## 2023-02-10 NOTE — ED Notes (Signed)
Patient offered snack, and she refused, she is sitting in her room drawing, no signs of distress.

## 2023-02-10 NOTE — ED Notes (Signed)
Patient ate 100% of lunch and beverage.  

## 2023-02-10 NOTE — ED Notes (Signed)
Patient complained of constipation and ask for the colace, Patient is without behavioral issues.

## 2023-02-10 NOTE — ED Notes (Signed)
PM snack provided. 

## 2023-02-10 NOTE — ED Notes (Signed)
Patient ate 100% of breakfast and beverage, and also took shower.

## 2023-02-10 NOTE — ED Provider Notes (Signed)
Emergency Medicine Observation Re-evaluation Note  Physical Exam   BP (!) 114/57 (BP Location: Right Arm)   Pulse (!) 109   Temp 97.7 F (36.5 C) (Oral)   Resp 16   Ht 5\' 6"  (1.676 m)   Wt 90.9 kg   SpO2 99%   BMI 32.35 kg/m   Patient appears in no acute distress.  ED Course / MDM   No reported events during my shift at the time of this note.   Pt is awaiting dispo from SW   Pilar Jarvis MD    Pilar Jarvis, MD 02/10/23 [phone]

## 2023-02-10 NOTE — ED Notes (Signed)
Pt given shower supplies and clean scrubs. Pt out of shower and in day room. Pt has no other needs at this time.

## 2023-02-11 DIAGNOSIS — F4325 Adjustment disorder with mixed disturbance of emotions and conduct: Secondary | ICD-10-CM | POA: Diagnosis not present

## 2023-02-11 LAB — URINALYSIS, COMPLETE (UACMP) WITH MICROSCOPIC
Bilirubin Urine: NEGATIVE
Glucose, UA: NEGATIVE mg/dL
Hgb urine dipstick: NEGATIVE
Ketones, ur: NEGATIVE mg/dL
Leukocytes,Ua: NEGATIVE
Nitrite: NEGATIVE
Protein, ur: NEGATIVE mg/dL
Specific Gravity, Urine: 1.013 (ref 1.005–1.030)
pH: 7 (ref 5.0–8.0)

## 2023-02-11 LAB — POC URINE PREG, ED: Preg Test, Ur: NEGATIVE

## 2023-02-11 MED ORDER — ONDANSETRON 4 MG PO TBDP
4.0000 mg | ORAL_TABLET | Freq: Once | ORAL | Status: AC
  Administered 2023-02-11: 4 mg via ORAL
  Filled 2023-02-11: qty 1

## 2023-02-11 MED ORDER — PANTOPRAZOLE SODIUM 40 MG PO TBEC
40.0000 mg | DELAYED_RELEASE_TABLET | Freq: Every day | ORAL | Status: AC
  Administered 2023-02-11 – 2023-02-17 (×7): 40 mg via ORAL
  Filled 2023-02-11 (×7): qty 1

## 2023-02-11 NOTE — TOC Progression Note (Signed)
Transition of Care [facility]) - Progression Note    Patient Details  Name: Crystal [name] MRN: [id number] Date of Birth: 11/14/1990  [facility]) CM/SW Contact  Halford Chessman Phone Number: 02/11/2023, 3:51 PM  Clinical Narrative:     CSW received email from Yeoman at [place].  Patient's Person Centered Plan has been completed, and she needed a physician's signature.  CSW had attending physician sign paperwork.  CSW emailed paperwork back to [place] at [facility]  Expected Discharge Plan: Group Home Barriers to Discharge:  (pending group home acceptance back)  Expected Discharge Plan and Services  Group home once paperwork completed.                                             Social Determinants of Health (SDOH) Interventions SDOH Screenings   Alcohol Screen: Low Risk  (11/11/2020)  Depression (PHQ2-9): Low Risk  (03/17/2022)  Tobacco Use: Medium Risk (11/14/2022)    Readmission Risk Interventions     No data to display

## 2023-02-11 NOTE — ED Notes (Signed)
Pt provided snack.

## 2023-02-11 NOTE — TOC Progression Note (Addendum)
Transition of Care [facility]) - Progression Note    Patient Details  Name: Crystal [name] MRN: [id number] Date of Birth: [birth year]  Transition of Care [facility]) CM/SW Contact  Darleene Cleaver, [place] Phone Number: 05/22 2:05 pm   Clinical Narrative:     CSW spoke to Guinda at [employer] she said they are waiting for the group home owner to complete the person centered plan (PCP) and once it is completed a physician will have to sign off on it.  Per Kennyth Arnold, the group home owner is hoping to have it completed by the end of the week.  Once the PCP is complete, then they can finalize the funding for patient to discharge.  She is hopeful to have everything completed within the next couple of weeks.  TOC to continue to follow patient's progress throughout discharge planning.  Expected Discharge Plan: Group Home Barriers to Discharge:  (pending group home acceptance back)  Expected Discharge Plan and Services    Group Home                                           Social Determinants of Health (SDOH) Interventions SDOH Screenings   Alcohol Screen: Low Risk  (11/11/2020)  Depression (PHQ2-9): Low Risk  (03/17/2022)  Tobacco Use: Medium Risk (11/14/2022)    Readmission Risk Interventions     No data to display

## 2023-02-11 NOTE — ED Notes (Signed)
Vol/toc.. 

## 2023-02-11 NOTE — ED Notes (Signed)
Patient states she has vomited x2 today (once earlier and recently). States her throat hurts before and after vomiting. Denies any abd pain. Patient's vitals have been normal and without fever. Patient denies any other complaints or vomiting seeming to correlate with anything (eating, etc.). Dr. Lenard Lance notified. Medications given as ordered.

## 2023-02-11 NOTE — ED Provider Notes (Signed)
Emergency Medicine Observation Re-evaluation Note  Physical Exam   BP 109/84 (BP Location: Right Arm)   Pulse 77   Temp 98.4 F (36.9 C) (Oral)   Resp 18   Ht 5\' 6"  (1.676 m)   Wt 90.9 kg   SpO2 97%   BMI 32.35 kg/m   Patient appears in no acute distress. Currently asleep, slight snoring.  ED Course / MDM   No reported events during my shift at the time of this note.   Pt is awaiting dispo from SW   Sharyn Creamer, MD 02/11/23 [phone]

## 2023-02-11 NOTE — ED Notes (Signed)
Lunch tray given to pt.

## 2023-02-11 NOTE — ED Notes (Signed)
Left message with legal guardian per patient request. Patient states she wants to talk to legal guardian "about something she can't talk to this RN about". Also asked guardian to bring glasses.

## 2023-02-12 DIAGNOSIS — F4325 Adjustment disorder with mixed disturbance of emotions and conduct: Secondary | ICD-10-CM | POA: Diagnosis not present

## 2023-02-12 NOTE — ED Provider Notes (Signed)
Emergency Medicine Observation Re-evaluation Note  Crystal [name] is a 32 y.o. female, seen on rounds today.  Pt initially presented to the ED for complaints of Back Pain, suicidal ideation, and Homicidal   Physical Exam  BP 90/64 (BP Location: Right Arm)   Pulse 87   Temp 98.2 F (36.8 C) (Oral)   Resp 18   Ht 5\' 6"  (1.676 m)   Wt 90.9 kg   SpO2 95%   BMI 32.35 kg/m  Physical Exam General: NAD  ED Course / MDM  EKG:   I have reviewed the labs performed to date as well as medications administered while in observation.  Plan  Current plan is for SW.    Willy Eddy, MD 02/12/23 [phone]

## 2023-02-12 NOTE — Progress Notes (Signed)
   02/12/23 1100  Spiritual Encounters  Type of Visit Initial  Conversation partners present during encounter Nurse  Referral source Patient request  Reason for visit  (Bible)  OnCall Visit Yes  Spiritual Framework  Presenting Themes Meaning/purpose/sources of inspiration;Goals in life/care;Values and beliefs;Impactful experiences and emotions;Courage hope and growth  Values/beliefs Christian  Community/Connection Limited  Patient Stress Factors Major life changes  Interventions  Spiritual Care Interventions Made Compassionate presence;Reflective listening;Encouragement  Intervention Outcomes  Outcomes Awareness around self/spiritual resourses;Connection to spiritual care;Connection to values and goals of care   On call chaplain received page to bring patient a Bible. Took patient Bible and spoke with her about her faith. Patient also shared with me that she is waiting to be transferred to a group home and it is causing her anxiety because she does not know when she will be able to leave.

## 2023-02-12 NOTE — ED Notes (Signed)
Pt's guardian from [employer] called with update. She states all the paperwork has been submitted and she is awaiting the [facility] final decision. She also said that she will bring the pt's glasses but it will be sometime toward the end of June as she will be on vacation starting next week. Pt was also able to speak to her and she received this same information directly from the guardian. Pt happy with the update from the guardian.

## 2023-02-12 NOTE — ED Notes (Signed)
Given snack. 

## 2023-02-12 NOTE — ED Notes (Signed)
Patient received lunch at 11:45am. Patient is now sleeping.

## 2023-02-12 NOTE — ED Notes (Signed)
VOL  TOC  PLACEMENT 

## 2023-02-13 DIAGNOSIS — F4325 Adjustment disorder with mixed disturbance of emotions and conduct: Secondary | ICD-10-CM | POA: Diagnosis not present

## 2023-02-13 MED ORDER — MENTHOL 3 MG MT LOZG
1.0000 | LOZENGE | OROMUCOSAL | Status: DC | PRN
  Administered 2023-02-13 – 2023-03-08 (×37): 3 mg via ORAL
  Filled 2023-02-13 (×8): qty 9

## 2023-02-13 NOTE — ED Notes (Signed)
Hospital snack given to pt with a fresh cup of water. Pt made aware no other food would be provided until breakfast time tomorrow morning.

## 2023-02-13 NOTE — ED Notes (Signed)
Fresh paper given- pt drawing calmly. Room cleaned and pt assisted.

## 2023-02-13 NOTE — Progress Notes (Signed)
   02/13/23 1400  Spiritual Encounters  Type of Visit Initial  Care provided to: Patient  Referral source Patient request  Reason for visit Routine spiritual support  OnCall Visit Yes  Spiritual Framework  Presenting Themes Courage hope and growth  Patient Stress Factors Major life changes   Chaplain responded to page requesting a visit for the patient to discuss life changes and concerns surrounding a move to a group home, as she understands it. Chaplain discussed patients tendency to anger and ways to control it. Samya requested songs to be printed.

## 2023-02-13 NOTE — ED Provider Notes (Signed)
Emergency Medicine Observation Re-evaluation Note  Crystal [name] is a 32 y.o. female, seen on rounds today.  Pt initially presented to the ED for complaints of Back Pain, suicidal ideation, and Homicidal   Physical Exam  BP 115/76   Pulse 90   Temp 98.3 F (36.8 C)   Resp 18   Ht 5\' 6"  (1.676 m)   Wt 90.9 kg   SpO2 97%   BMI 32.35 kg/m  Physical Exam General: no distress Lungs: nml wob Psych: no agitation  ED Course / MDM  EKG:   I have reviewed the labs performed to date as well as medications administered while in observation.  Recent changes in the last 24 hours include none.  Plan  Current plan is for SW disposition.     Georga Hacking, MD 02/13/23 [phone]

## 2023-02-13 NOTE — ED Notes (Signed)
Pt complaining of sore throat and cough. RN assessed throat- some mild swelling noted. No pus or redness. RN informed MD. Will given ibuprofen at bedtime.

## 2023-02-13 NOTE — ED Notes (Signed)
PT up to restroom. PT given breakfast tray and beverage.

## 2023-02-14 DIAGNOSIS — F4325 Adjustment disorder with mixed disturbance of emotions and conduct: Secondary | ICD-10-CM | POA: Diagnosis not present

## 2023-02-14 LAB — GROUP A STREP BY PCR: Group A Strep by PCR: NOT DETECTED

## 2023-02-14 NOTE — ED Notes (Signed)
Pt complains of headache requesting ibuprofen

## 2023-02-14 NOTE — ED Notes (Signed)
VOL/ TOC 

## 2023-02-14 NOTE — ED Provider Notes (Signed)
Emergency Medicine Observation Re-evaluation Note  Crystal [name] is a 32 y.o. female, seen on rounds today.  Pt initially presented to the ED for complaints of Back Pain, suicidal ideation, and Homicidal Currently, the patient is calm, resting.  Physical Exam  BP 94/68 (BP Location: Right Arm)   Pulse 93   Temp 98.8 F (37.1 C) (Oral)   Resp 18   Ht 5\' 6"  (1.676 m)   Wt 90.9 kg   SpO2 97%   BMI 32.35 kg/m  Physical Exam General:  ED Course / MDM  EKG:   I have reviewed the labs performed to date as well as medications administered while in observation.  Recent changes in the last 24 hours include none.  Plan  Current plan is for SW/TTS dispo.    Shaune Pollack, MD 02/14/23 [phone]

## 2023-02-14 NOTE — ED Notes (Signed)
Patient is vol pending TOC placement 

## 2023-02-15 DIAGNOSIS — F4325 Adjustment disorder with mixed disturbance of emotions and conduct: Secondary | ICD-10-CM | POA: Diagnosis not present

## 2023-02-15 NOTE — ED Notes (Signed)
Pt taking shower. Pt was given hygiene items and the following, 1 clean top, 1 clean bottom, with 1 pair of disposable underwear.  Pt changed out into clean clothing.  Staff disposed of all shower supplies.   

## 2023-02-15 NOTE — ED Notes (Signed)
Pt provided with apple juice and a breakfast tray

## 2023-02-15 NOTE — ED Notes (Signed)
Lunch meal was given at this time. 

## 2023-02-15 NOTE — ED Notes (Signed)
TOC Placement 

## 2023-02-15 NOTE — ED Provider Notes (Signed)
Emergency Medicine Observation Re-evaluation Note  Crystal [name] is a 32 y.o. female, seen on rounds today.  Pt initially presented to the ED for complaints of Back Pain, suicidal ideation, and Homicidal  Currently, the patient is resting in bed. No reported issues overnight from nursing team.   Physical Exam  BP 98/64   Pulse 76   Temp 98.7 F (37.1 C) (Oral)   Resp 16   Ht 5\' 6"  (1.676 m)   Wt 90.9 kg   SpO2 95%   BMI 32.35 kg/m  Physical Exam General: Resting comfortably in bed  ED Course / MDM  EKG:   I have reviewed the labs and medications over the past 24 hours. Recent changes in the last 24 hours are none.   Plan  Current plan is for TOC placement.    Trinna Post, MD 02/15/23 1026

## 2023-02-16 DIAGNOSIS — F4325 Adjustment disorder with mixed disturbance of emotions and conduct: Secondary | ICD-10-CM | POA: Diagnosis not present

## 2023-02-16 NOTE — TOC Progression Note (Signed)
Transition of Care [facility]) - Progression Note    Patient Details  Name: Crystal [name] MRN: [id number] Date of Birth: [birth year]  Transition of Care [facility]) CM/SW Contact  Darleene Cleaver, [place] Phone Number: 02/16/2023, 11:25 AM  Clinical Narrative:     Person Centered Plan emailed to [employer], waiting on final funding approval from [employer] before group home can accept patient.  Expected Discharge Plan: Group Home Barriers to Discharge:  (pending group home acceptance back)  Expected Discharge Plan and Services                                               Social Determinants of Health (SDOH) Interventions SDOH Screenings   Alcohol Screen: Low Risk  (11/11/2020)  Depression (PHQ2-9): Low Risk  (03/17/2022)  Tobacco Use: Medium Risk (11/14/2022)    Readmission Risk Interventions     No data to display

## 2023-02-16 NOTE — ED Notes (Signed)
Pt given more paper to color on. Pt c/c currently with staff.

## 2023-02-16 NOTE — ED Notes (Signed)
Hospital meal provided, pt tolerated w/o complaints.  Waste discarded appropriately.  

## 2023-02-16 NOTE — ED Notes (Signed)
Lunch tray provided for pt

## 2023-02-16 NOTE — ED Notes (Signed)
Vol  TOC  placement 

## 2023-02-16 NOTE — ED Notes (Signed)
Snack given to pt.

## 2023-02-16 NOTE — ED Notes (Signed)
Patient is taking a shower at this time. 

## 2023-02-16 NOTE — ED Provider Notes (Signed)
Emergency Medicine Observation Re-evaluation Note  Crystal [name] is a 32 y.o. female, seen on rounds today.  Pt initially presented to the ED for complaints of Back Pain, suicidal ideation, and Homicidal Currently, the patient is sitting in the day room, no issues overnight.  Physical Exam  BP 98/69 (BP Location: Left Arm)   Pulse (!) 111   Temp 98.2 F (36.8 C) (Oral)   Resp 17   Ht 5\' 6"  (1.676 m)   Wt 90.9 kg   SpO2 98%   BMI 32.35 kg/m  Physical Exam Patient appears well, no acute distress, normal WOB    ED Course / MDM  EKG:   I have reviewed the labs performed to date as well as medications administered while in observation.  Recent changes in the last 24 hours include none.  Plan  Current plan is for dispo per social work.    Chesley Noon, MD 02/16/23 1006

## 2023-02-16 NOTE — ED Notes (Signed)
Patient asked for clean sheets and we provided those for her.

## 2023-02-17 DIAGNOSIS — F4325 Adjustment disorder with mixed disturbance of emotions and conduct: Secondary | ICD-10-CM | POA: Diagnosis not present

## 2023-02-17 NOTE — ED Notes (Addendum)
Lunch tray given. 

## 2023-02-17 NOTE — ED Notes (Signed)
Patient given breakfast tray.

## 2023-02-17 NOTE — ED Provider Notes (Signed)
Emergency Medicine Observation Re-evaluation Note  Crystal [name] is a 32 y.o. female, seen on rounds today.  Pt initially presented to the ED for complaints of Back Pain, suicidal ideation, and Homicidal   Physical Exam  BP 114/81   Pulse 73   Temp 98.6 F (37 C) (Oral)   Resp 20   Ht 5\' 6"  (1.676 m)   Wt 90.9 kg   SpO2 100%   BMI 32.35 kg/m  Physical Exam General: NAD  ED Course / MDM  EKG:   I have reviewed the labs performed to date as well as medications administered while in observation.   Plan  Current plan is for [facility].    Willy Eddy, MD 02/17/23 [phone]

## 2023-02-17 NOTE — ED Notes (Signed)
Patient given shower supplies. Patient showering now.

## 2023-02-17 NOTE — ED Notes (Signed)
Patient given snack.  

## 2023-02-17 NOTE — Progress Notes (Signed)
  Chaplain On-Call responded to a page from [employer], who reported the patient's request for a Chaplain visit. Chaplain met with the patient in the day area, and offered compassionate listening as the patient stated her frustration at receiving unclear information about her future housing arrangements.  The patient also stated that she will appreciate the [employer] that she would like to have: Crossword Puzzles By-the-Numbers sheets Word Search Hymns  Chaplain assured patient that our staff will attempt to provide these for her as we are able.  Chaplain Evelena Peat M.Div., [facility]

## 2023-02-17 NOTE — Progress Notes (Addendum)
This Chap visited Pt to hand over some song sheets Pt had requested in a previous visit, Pt expressed frustration at the delayed process of finding her a group home. She lamented " My case worker said the paper work is complete but someone else said they are waiting for the funding" Mirna Mires offered a calming presence,reflective listening as well as a prayer as requested by the Pt.   02/17/23 1400  Spiritual Encounters  Type of Visit Follow up  Care provided to: Patient  Conversation partners present during encounter Nurse  Referral source Chaplain assessment  Reason for visit Routine spiritual support  OnCall Visit No  Spiritual Framework  Presenting Themes Coping tools;Courage hope and growth  Community/Connection Limited  Patient Stress Factors Lack of knowledge  Interventions  Spiritual Care Interventions Made Prayer;Narrative/life review  Intervention Outcomes  Outcomes Connection to spiritual care;Connection to values and goals of care;Awareness of support  Spiritual Care Plan  Spiritual Care Issues Still Outstanding No further spiritual care needs at this time (see row info)

## 2023-02-17 NOTE — ED Notes (Signed)
Dinner tray provided

## 2023-02-18 DIAGNOSIS — F4325 Adjustment disorder with mixed disturbance of emotions and conduct: Secondary | ICD-10-CM | POA: Diagnosis not present

## 2023-02-18 NOTE — ED Notes (Signed)
Dinner tray provided for pt 

## 2023-02-18 NOTE — ED Provider Notes (Signed)
Emergency Medicine Observation Re-evaluation Note  Crystal [name] is a 32 y.o. female, currently boarding in the emergency department.  No acute events since last update  Physical Exam  BP 99/64 (BP Location: Right Arm)   Pulse 89   Temp 98.3 F (36.8 C) (Oral)   Resp 18   Ht 5\' 6"  (1.676 m)   Wt 90.9 kg   SpO2 98%   BMI 32.35 kg/m   ED Course / MDM   No recent lab work for review besides a negative strep and pregnancy test last week  Plan  Current plan is for placement to an appropriate living facility once available.    [name], MD 02/18/23 1454

## 2023-02-18 NOTE — ED Notes (Signed)
VOL TOC placement 

## 2023-02-19 DIAGNOSIS — F4325 Adjustment disorder with mixed disturbance of emotions and conduct: Secondary | ICD-10-CM | POA: Diagnosis not present

## 2023-02-19 MED ORDER — LORATADINE 10 MG PO TABS
10.0000 mg | ORAL_TABLET | Freq: Every day | ORAL | Status: DC | PRN
  Administered 2023-02-19 – 2023-03-29 (×17): 10 mg via ORAL
  Filled 2023-02-19 (×19): qty 1

## 2023-02-19 NOTE — ED Notes (Signed)
VOL/Pending TOC Placement 

## 2023-02-19 NOTE — TOC Progression Note (Signed)
Transition of Care [facility]) - Progression Note    Patient Details  Name: Crystal [name] MRN: [id number] Date of Birth: [birth year]  [facility]) CM/SW Contact  Darleene Cleaver, [place] Phone Number: 5/30 330 pm  Clinical Narrative:     CSW received email from [employer], owner of the group home [facility] has submitted all paperwork to [facility] for approval.  Once patient is approved then she can discharge to group home.  TOC to continue to follow patient's progress throughout discharge planning.   Expected Discharge Plan: Group Home Barriers to Discharge:  (pending group home acceptance back)  Expected Discharge Plan and Services      Group home                                         Social Determinants of Health (SDOH) Interventions SDOH Screenings   Alcohol Screen: Low Risk  (11/11/2020)  Depression (PHQ2-9): Low Risk  (03/17/2022)  Tobacco Use: Medium Risk (11/14/2022)    Readmission Risk Interventions     No data to display

## 2023-02-19 NOTE — ED Notes (Signed)
Dinner tray provided

## 2023-02-19 NOTE — ED Notes (Signed)
Patient coming to the door often to complain about other Patients, but can be easily redirected, she denies Si/hi or avh. Staff will continue to monitor for safety.

## 2023-02-19 NOTE — ED Notes (Signed)
Patient given snack.  

## 2023-02-19 NOTE — ED Notes (Signed)
Patient is the dayroom, calm and cooperative, no signs of distress, no behavioral issues .

## 2023-02-19 NOTE — ED Provider Notes (Signed)
Emergency Medicine Observation Re-evaluation Note  Crystal [name] is a 32 y.o. female, seen on rounds today.  Pt initially presented to the ED for complaints of Back Pain, suicidal ideation, and Homicidal Currently, the patient is calm, resting.  Physical Exam  BP 104/80   Pulse (!) 101   Temp 98 F (36.7 C) (Oral)   Resp 18   Ht 5\' 6"  (1.676 m)   Wt 90.9 kg   SpO2 95%   BMI 32.35 kg/m    ED Course / MDM  EKG:   I have reviewed the labs performed to date as well as medications administered while in observation.  Recent changes in the last 24 hours include None.  Plan  Current plan is for TOC dispo.    Shaune Pollack, MD 02/19/23 1320

## 2023-02-19 NOTE — ED Notes (Signed)
Patient had snack and beverage.  

## 2023-02-20 DIAGNOSIS — F4325 Adjustment disorder with mixed disturbance of emotions and conduct: Secondary | ICD-10-CM | POA: Diagnosis not present

## 2023-02-20 NOTE — ED Notes (Signed)
VOL/ TOC 

## 2023-02-20 NOTE — ED Notes (Signed)
Dinner given

## 2023-02-20 NOTE — ED Provider Notes (Signed)
Emergency Medicine Observation Re-evaluation Note  Crystal [name] is a 32 y.o. female, seen on rounds today.  Pt initially presented to the ED for complaints of Back Pain, suicidal ideation, and Homicidal   Physical Exam  BP 108/77 (BP Location: Left Arm)   Pulse 82   Temp 98.1 F (36.7 C) (Oral)   Resp 18   Ht 1.676 m (5\' 6" )   Wt 90.9 kg   SpO2 98%   BMI 32.35 kg/m  Physical Exam General: Patient is still in bed, no events overnight   ED Course / MDM    Plan  Current plan is for group home discharge, pending paperwork completion.    Jene Every, MD 02/20/23 [phone]

## 2023-02-20 NOTE — ED Notes (Signed)
Patient calm and cooperative this evening. Inquiring about her status on placement. PRN cough drop given. Resting throughout the night. No behavioral concerns.

## 2023-02-20 NOTE — ED Notes (Signed)
Pt given nighttime snack. 

## 2023-02-21 DIAGNOSIS — F4325 Adjustment disorder with mixed disturbance of emotions and conduct: Secondary | ICD-10-CM | POA: Diagnosis not present

## 2023-02-21 NOTE — ED Notes (Signed)
Patient is vol pending TOC placement 

## 2023-02-21 NOTE — ED Notes (Signed)
Patient requested lozenge for sore throat. Medication given as prn ordered.

## 2023-02-21 NOTE — ED Notes (Signed)
Left message with legal guardian with list of items patient was asking for (books, cross words, coloring sheets, etc) for patient's upcoming discharge.

## 2023-02-21 NOTE — ED Notes (Signed)
Snack and drink given 

## 2023-02-21 NOTE — ED Notes (Signed)
Patient c/o sore throat, lozenge given as prn ordered. Patient updated on most recent status per social work's notes (awaiting paperwork to be approved).

## 2023-02-21 NOTE — ED Notes (Signed)
Pt states coming in because she ran away from her group home. Pt states she had a BM yesterday. Pt denies SI/HI

## 2023-02-21 NOTE — ED Notes (Signed)
Patient requested medication for allergies. Claritin given as prn ordered.

## 2023-02-21 NOTE — ED Notes (Signed)
Vol/toc.. 

## 2023-02-21 NOTE — ED Provider Notes (Signed)
Emergency Medicine Observation Re-evaluation Note  Crystal [name] is a 32 y.o. female, seen on rounds today.  Pt initially presented to the ED for complaints of Back Pain, suicidal ideation, and Homicidal   Physical Exam  BP 116/87 (BP Location: Left Arm)   Pulse 98   Temp 97.9 F (36.6 C) (Oral)   Resp 17   Ht 5\' 6"  (1.676 m)   Wt 90.9 kg   SpO2 95%   BMI 32.35 kg/m  Physical Exam General: Patient is still in bed, no events overnight   ED Course / MDM    Plan  Current plan is for group home discharge, pending paperwork completion.      Sharyn Creamer, MD 02/21/23 [phone]

## 2023-02-22 DIAGNOSIS — F4325 Adjustment disorder with mixed disturbance of emotions and conduct: Secondary | ICD-10-CM | POA: Diagnosis not present

## 2023-02-22 NOTE — ED Notes (Signed)
Pt taking shower. Pt was given hygiene items and the following, 1 clean top, 1 clean bottom, with 1 pair of disposable underwear.  Pt changed out into clean clothing.  Staff disposed of all shower supplies.   

## 2023-02-22 NOTE — ED Notes (Signed)
Patient received lunch tray at this time.

## 2023-02-22 NOTE — ED Notes (Signed)
Snack provided

## 2023-02-22 NOTE — ED Notes (Signed)
Hospital meal provided, pt tolerated w/o complaints.  Waste discarded appropriately.  

## 2023-02-22 NOTE — ED Provider Notes (Signed)
Emergency Medicine Observation Re-evaluation Note  Crystal [name] is a 32 y.o. female, seen on rounds today.  Pt initially presented to the ED for complaints of Back Pain, suicidal ideation, and Homicidal  Currently, the patient is resting in bed. No reported issues overnight from nursing team.   Physical Exam  BP 110/79 (BP Location: Right Arm)   Pulse 69   Temp 98 F (36.7 C) (Oral)   Resp 17   Ht 5\' 6"  (1.676 m)   Wt 90.9 kg   SpO2 98%   BMI 32.35 kg/m  Physical Exam General: Resting comfortably in bed  ED Course / MDM    Plan  Current plan is for dispo per social work, current plan for group home discharge pending paperwork completion.     Trinna Post, MD 02/22/23 [phone]

## 2023-02-22 NOTE — ED Notes (Signed)
Vol  pending  TOC  placement 

## 2023-02-23 DIAGNOSIS — F4325 Adjustment disorder with mixed disturbance of emotions and conduct: Secondary | ICD-10-CM | POA: Diagnosis not present

## 2023-02-23 NOTE — ED Notes (Signed)

## 2023-02-23 NOTE — ED Notes (Signed)
Breakfast tray given to pt 

## 2023-02-23 NOTE — ED Notes (Addendum)
Pt in restroom 

## 2023-02-23 NOTE — ED Notes (Signed)
VOL/Pending Placement 

## 2023-02-23 NOTE — ED Notes (Signed)
Pt given snack and beverage. 

## 2023-02-23 NOTE — ED Notes (Signed)
Pt in restroom 

## 2023-02-23 NOTE — ED Provider Notes (Signed)
Emergency Medicine Observation Re-evaluation Note  Crystal [name] is a 32 y.o. female, seen on rounds today.  Pt initially presented to the ED for complaints of Back Pain, suicidal ideation, and Homicidal Currently, the patient is sleeping.   Physical Exam  BP (!) 109/46 (BP Location: Right Arm)   Pulse 78   Temp 98.4 F (36.9 C) (Oral)   Resp 20   Ht 5\' 6"  (1.676 m)   Wt 90.9 kg   SpO2 96%   BMI 32.35 kg/m  Physical Exam General: sleeping comfortably, no distress    ED Course / MDM  EKG:   I have reviewed the labs performed to date as well as medications administered while in observation.  Recent changes in the last 24 hours include none.  Plan  Current plan is for disposition by SW team. Per last note, she is expected to be dc'd to group home, pending acceptance.     Georga Hacking, MD 02/23/23 [phone]

## 2023-02-24 DIAGNOSIS — F4325 Adjustment disorder with mixed disturbance of emotions and conduct: Secondary | ICD-10-CM | POA: Diagnosis not present

## 2023-02-24 NOTE — ED Notes (Signed)
Patient is taking a shower, no behavioral issues noted. Patient has been calm and cooperative.

## 2023-02-24 NOTE — TOC Progression Note (Signed)
Transition of Care [facility]) - Progression Note    Patient Details  Name: Crystal [name] MRN: [id number] Date of Birth: 07/29/1991  Transition of Care [facility]) CM/SW Contact  Darleene Cleaver, [place] Phone Number: 02/23/2023, 1:07 PM  Clinical Narrative:     CSW emailed [employer] to request an update on when funding will be finalized.  Waiting for update from [employer].  TOC to continue to follow patient throughout discharge planning.   Expected Discharge Plan: Group Home Barriers to Discharge:  (pending group home acceptance back)  Expected Discharge Plan and Services                                               Social Determinants of Health (SDOH) Interventions SDOH Screenings   Alcohol Screen: Low Risk  (11/11/2020)  Depression (PHQ2-9): Low Risk  (03/17/2022)  Tobacco Use: Medium Risk (11/14/2022)    Readmission Risk Interventions     No data to display

## 2023-02-24 NOTE — ED Notes (Signed)
Breakfast given with beverage : ate 100%

## 2023-02-24 NOTE — ED Notes (Signed)
Pt provided snack.

## 2023-02-24 NOTE — ED Notes (Signed)
VOL/Pending placement 

## 2023-02-24 NOTE — ED Notes (Signed)
This tech gave this patient dinner and beverage. Patient ate 90% of meal.

## 2023-02-24 NOTE — ED Notes (Signed)
Patient is calm and cooperative, no signs of distress, no behavioral issue noted.

## 2023-02-24 NOTE — TOC Progression Note (Signed)
Transition of Care [facility]) - Progression Note    Patient Details  Name: Crystal [name] MRN: [id number] Date of Birth: [birth year]  [facility]) CM/SW Contact  Darleene Cleaver, [place] Phone Number: 02/22/2023, 1:06 PM  Clinical Narrative:    Per [employer], they are still waiting for the contract to be completed in order to provide group home for patient.  CSW continuing to follow patient's progress throughout discharge planning.   Expected Discharge Plan: Group Home Barriers to Discharge:  (pending group home acceptance back)  Expected Discharge Plan and Services    Group Home                                           Social Determinants of Health (SDOH) Interventions SDOH Screenings   Alcohol Screen: Low Risk  (11/11/2020)  Depression (PHQ2-9): Low Risk  (03/17/2022)  Tobacco Use: Medium Risk (11/14/2022)    Readmission Risk Interventions     No data to display

## 2023-02-25 DIAGNOSIS — F4325 Adjustment disorder with mixed disturbance of emotions and conduct: Secondary | ICD-10-CM | POA: Diagnosis not present

## 2023-02-25 NOTE — ED Notes (Signed)
Dinner tray provided for pt 

## 2023-02-25 NOTE — ED Notes (Signed)
vol/pending toc placement.. 

## 2023-02-25 NOTE — ED Notes (Signed)

## 2023-02-25 NOTE — ED Notes (Signed)
VOL/pending ToC placement

## 2023-02-25 NOTE — TOC Progression Note (Signed)
Transition of Care [facility]) - Progression Note    Patient Details  Name: Crystal [name] MRN: [id number] Date of Birth: 1991-07-14  Transition of Care [facility]) CM/SW Contact  Darleene Cleaver, [place] Phone Number: 02/24/2023, 7:41 AM  Clinical Narrative:     Still waiting for contract with [employer] to be finalized for patient to go to new group home in [facility].  Expected Discharge Plan: Group Home Barriers to Discharge:  (pending group home acceptance back)  Expected Discharge Plan and Services  New group home in [place].                                             Social Determinants of Health (SDOH) Interventions SDOH Screenings   Alcohol Screen: Low Risk  (11/11/2020)  Depression (PHQ2-9): Low Risk  (03/17/2022)  Tobacco Use: Medium Risk (11/14/2022)    Readmission Risk Interventions     No data to display

## 2023-02-26 DIAGNOSIS — F4325 Adjustment disorder with mixed disturbance of emotions and conduct: Secondary | ICD-10-CM | POA: Diagnosis not present

## 2023-02-26 NOTE — ED Notes (Signed)
VOL/Pending Placement 

## 2023-02-26 NOTE — ED Notes (Signed)
Provided pt with snacks: cereal, milk, ice cream, graham crackers.

## 2023-02-26 NOTE — TOC Progression Note (Signed)
Transition of Care [facility]) - Progression Note    Patient Details  Name: Crystal [name] MRN: [id number] Date of Birth: 05-26-1991  Transition of Care [facility]) CM/SW Contact  Halford Chessman Phone Number: 02/26/2023, 6:28 PM  Clinical Narrative:    Team meeting on 6/12 at 11am to discuss patient.Paperwork still being completed.   Expected Discharge Plan: Group Home Barriers to Discharge:  (pending group home acceptance back)  Expected Discharge Plan and Services                                               Social Determinants of Health (SDOH) Interventions SDOH Screenings   Alcohol Screen: Low Risk  (11/11/2020)  Depression (PHQ2-9): Low Risk  (03/17/2022)  Tobacco Use: Medium Risk (11/14/2022)    Readmission Risk Interventions     No data to display

## 2023-02-26 NOTE — ED Provider Notes (Signed)
Emergency Medicine Observation Re-evaluation Note  Physical Exam   BP 113/79 (BP Location: Right Arm)   Pulse 88   Temp 98.7 F (37.1 C)   Resp 15   Ht 5\' 6"  (1.676 m)   Wt 90.9 kg   SpO2 97%   BMI 32.35 kg/m   Patient appears in no acute distress.  ED Course / MDM   No reported events during my shift at the time of this note.   Pt is awaiting dispo from SW   Pilar Jarvis MD    Pilar Jarvis, MD 02/26/23 [phone]

## 2023-02-26 NOTE — ED Notes (Signed)
Pt was given snack and water

## 2023-02-26 NOTE — ED Notes (Signed)
VOL  TOC  PLACEMENT 

## 2023-02-26 NOTE — ED Notes (Signed)
Pt was given dinner tray and beverage 

## 2023-02-27 DIAGNOSIS — F4325 Adjustment disorder with mixed disturbance of emotions and conduct: Secondary | ICD-10-CM | POA: Diagnosis not present

## 2023-02-27 NOTE — ED Notes (Signed)
Dinner tray being given to patient at this time by [place], [place].

## 2023-02-27 NOTE — ED Notes (Signed)
Patient tray disposed of at this time.

## 2023-02-27 NOTE — ED Notes (Signed)
Pt lunch at bedside. Pt was notified. Pt is in and out.

## 2023-02-27 NOTE — ED Notes (Signed)
Cleaned up patient room and disposed of any trash. Linens changed. Vital signs being taken by [name], NT at this time.

## 2023-02-27 NOTE — ED Notes (Signed)
Patient is sitting in the dayroom, no signs of distress, she is being calm and cooperative, staff will continue to monitor for safety.

## 2023-02-27 NOTE — ED Notes (Signed)
Pt complaining of sore throat and nausea. Gave patient nausea medicine and cough drop at this time.

## 2023-02-27 NOTE — ED Notes (Signed)
VOL/ TOC 

## 2023-02-27 NOTE — ED Notes (Signed)
Shower supplies given to pt. Pt showering at this time 

## 2023-02-27 NOTE — ED Notes (Signed)
When doing hourly rounding on patients heard vomiting noises from patients room. Patient vomited on side of bed. Vomit cleaned up by this RN and Nicki [place], NT. Pt bed, sheets and clothes dry and pt stated did not need any new linens or sheets at this time. Pt requested to go to the bathroom at this time.

## 2023-02-27 NOTE — ED Provider Notes (Signed)
Emergency Medicine Observation Re-evaluation Note  Crystal [name] is a 33 y.o. female, seen on rounds today.  Pt initially presented to the ED for complaints of Back Pain, suicidal ideation, and Homicidal  Currently, the patient is resting in bed. No reported issues overnight from nursing team.   Physical Exam  BP 113/85 (BP Location: Right Arm)   Pulse 96   Temp 98.9 F (37.2 C) (Oral)   Resp 20   Ht 5\' 6"  (1.676 m)   Wt 90.9 kg   SpO2 98%   BMI 32.35 kg/m  Physical Exam General: Resting comfortably in bed  ED Course / MDM  EKG:  Plan  Current plan is for dispo per social work, current plan for group home.    Trinna Post, MD 02/27/23 [phone]

## 2023-02-27 NOTE — ED Notes (Signed)
Snack provided

## 2023-02-27 NOTE — ED Notes (Signed)
Snack was provided for patient. 

## 2023-02-28 DIAGNOSIS — F4325 Adjustment disorder with mixed disturbance of emotions and conduct: Secondary | ICD-10-CM | POA: Diagnosis not present

## 2023-02-28 NOTE — ED Notes (Signed)
Pt c/c with staff. Pt received lunch tray and drink.

## 2023-02-28 NOTE — ED Provider Notes (Signed)
Emergency Medicine Observation Re-evaluation Note  Physical Exam   BP 119/65   Pulse 72   Temp 98.3 F (36.8 C)   Resp 16   Ht 5\' 6"  (1.676 m)   Wt 90.9 kg   SpO2 94%   BMI 32.35 kg/m   Patient appears in no acute distress.  ED Course / MDM   No reported events during my shift at the time of this note.   Pt is awaiting dispo from SW   Pilar Jarvis MD    Pilar Jarvis, MD 02/28/23 [phone]

## 2023-02-28 NOTE — ED Notes (Signed)
Hospital meal provided, pt tolerated w/o complaints.  Waste discarded appropriately.  

## 2023-02-28 NOTE — ED Notes (Signed)
Pt c/c with staff currently. Pt declined offer of snack and drink.

## 2023-02-28 NOTE — ED Notes (Signed)
Pt given another piece of paper as requested.

## 2023-02-28 NOTE — ED Notes (Signed)
Patient is vol pending TOC placement 

## 2023-02-28 NOTE — ED Notes (Signed)
Pt's bed linens changed out as requested.

## 2023-03-01 DIAGNOSIS — F4325 Adjustment disorder with mixed disturbance of emotions and conduct: Secondary | ICD-10-CM | POA: Diagnosis not present

## 2023-03-01 NOTE — ED Notes (Signed)
Pt given PM snack at this time.  

## 2023-03-01 NOTE — ED Provider Notes (Signed)
Emergency Medicine Observation Re-evaluation Note  Crystal [name] is a 32 y.o. female, seen on rounds today.  Pt initially presented to the ED for complaints of Back Pain, suicidal ideation, and Homicidal Currently, the patient is calm.  Physical Exam  BP 110/74 (BP Location: Right Arm)   Pulse 88   Temp 98 F (36.7 C) (Oral)   Resp 18   Ht 5\' 6"  (1.676 m)   Wt 90.9 kg   SpO2 98%   BMI 32.35 kg/m    ED Course / MDM  EKG:   I have reviewed the labs performed to date as well as medications administered while in observation.  Recent changes in the last 24 hours include none.  Plan  Current plan is for TOC dispo.    Shaune Pollack, MD 03/01/23 [phone]

## 2023-03-01 NOTE — TOC Progression Note (Signed)
Transition of Care [facility]) - Progression Note    Patient Details  Name: Crystal [name] MRN: [id number] Date of Birth: [birth year]  Transition of Care [facility]) CM/SW Contact  Halford Chessman Phone Number: 03/01/2023, 5:52 PM  Clinical Narrative:     Team meeting on Wednesday at 11am to get update on when patient can discharge to new group home.   Expected Discharge Plan: Group Home Barriers to Discharge:  (pending group home acceptance back)  Expected Discharge Plan and Services                                               Social Determinants of Health (SDOH) Interventions SDOH Screenings   Alcohol Screen: Low Risk  (11/11/2020)  Depression (PHQ2-9): Low Risk  (03/17/2022)  Tobacco Use: Medium Risk (11/14/2022)    Readmission Risk Interventions     No data to display

## 2023-03-01 NOTE — ED Notes (Signed)
Requesting coloring supplies. Up to b/r. Coloring supplies provided.

## 2023-03-01 NOTE — ED Notes (Signed)
VOL/Pending Placement 

## 2023-03-01 NOTE — ED Notes (Signed)
Awakened, and up to window, c/o HA, sore throat, and nausea. States, "feels like I'm going to throw up my lunch".

## 2023-03-02 DIAGNOSIS — F4325 Adjustment disorder with mixed disturbance of emotions and conduct: Secondary | ICD-10-CM | POA: Diagnosis not present

## 2023-03-02 NOTE — ED Provider Notes (Signed)
Emergency Medicine Observation Re-evaluation Note  Crystal [name] is a 32 y.o. female, initially seen in the emergency department for psychiatric complaint currently boarding in the emergency department.  No acute events since last update.  Physical Exam  BP (!) 99/54 (BP Location: Right Arm)   Pulse 75   Temp 98.8 F (37.1 C) (Oral)   Resp 18   Ht 5\' 6"  (1.676 m)   Wt 90.9 kg   SpO2 97%   BMI 32.35 kg/m   ED Course / MDM   There is no recent lab work available for review.  Plan  Current plan is for placement to an appropriate living facility once available.  Social worker is currently working with the patient to achieve this.    [name], MD 03/02/23 [phone]

## 2023-03-02 NOTE — ED Notes (Signed)
Pt. Alert and oriented, warm and dry, in no distress. Pt. Denies SI, HI, and AVH. Pt. Encouraged to let nursing staff know of any concerns or needs. 

## 2023-03-02 NOTE — ED Notes (Signed)
VOL  PENDING  PLACEMENT 

## 2023-03-02 NOTE — ED Notes (Signed)

## 2023-03-02 NOTE — ED Notes (Signed)
Pt given hospital provided snack. 

## 2023-03-03 DIAGNOSIS — F4325 Adjustment disorder with mixed disturbance of emotions and conduct: Secondary | ICD-10-CM | POA: Diagnosis not present

## 2023-03-03 NOTE — ED Notes (Signed)
Breakfast tray provided. 

## 2023-03-03 NOTE — ED Notes (Signed)
Patient given snack.  

## 2023-03-03 NOTE — ED Notes (Signed)
VOL/Pending placement 

## 2023-03-03 NOTE — ED Notes (Signed)
Pt given dinner tray and drink. No other needs voiced at this time. 

## 2023-03-03 NOTE — ED Notes (Signed)
Pt taking shower. Pt was given hygiene items and the following, 1 clean top, 1 clean bottom, with 1 pair of disposable underwear.  Pt changed out into clean clothing.  Staff disposed of all shower supplies.   

## 2023-03-03 NOTE — ED Notes (Signed)
Patient is staring at the staff, very restless, asking when Her meeting will start, Nurse spoke with her and she became calm, staff will continue to monitor.

## 2023-03-03 NOTE — ED Notes (Signed)
Patient is excited about meeting today, Nurse talk to her about not worrying and just to be calm and all things would work themselves out, she agreed. Patient denies Si/hi ro avh.

## 2023-03-03 NOTE — ED Notes (Signed)

## 2023-03-03 NOTE — ED Notes (Signed)
Patient states she does not have a sore throat anymore but she like the cough drops since we won't give her nicotine gum.

## 2023-03-03 NOTE — ED Notes (Signed)
Snack was provided to pt. 

## 2023-03-03 NOTE — ED Notes (Signed)
Hospital meal provided, pt tolerated w/o complaints.  Waste discarded appropriately.  

## 2023-03-03 NOTE — ED Notes (Signed)
Report received from [facility]. Patient care assumed. Patient/RN introduction complete. Will continue to monitor.

## 2023-03-04 DIAGNOSIS — F4325 Adjustment disorder with mixed disturbance of emotions and conduct: Secondary | ICD-10-CM | POA: Diagnosis not present

## 2023-03-04 NOTE — ED Notes (Signed)
VOL  PENDING  PLACEMENT 

## 2023-03-04 NOTE — ED Notes (Signed)
Pt declined a snack but did take a cup of water.

## 2023-03-04 NOTE — TOC Progression Note (Signed)
Transition of Care [facility]) - Progression Note    Patient Details  Name: Crystal [name] MRN: [id number] Date of Birth: 01/22/1991  Transition of Care [facility]) CM/SW Contact  Darleene Cleaver, [place] Phone Number: 03/03/2023, 4:30pm  Clinical Narrative:     Team meeting held with patient, this CSW, her legal guardian, [employer] rep, and [employer] rep.  Per [employer], patient has a group home that will be accepting patient, however the owner of the group home is trying to get approval for patient to have 2:1 staffing due to her history of elopement.  Per [employer], partial funding has been approved, but group home is trying to get more funding in order to meet the needs of the patient and group home.  [name] was very anxious during meeting and was asking when she will be moving, the team informed her that they are still waiting on final contract for patient.  Per [employer], they are hoping to have funding finalized before her next team meeting on April 02, 2023.  This CSW asked the team to continue to keep [facility] aware of the progress that is being made.  TOC to continue to follow patient's progress throughout discharge planning.  Expected Discharge Plan: Group Home Barriers to Discharge:  (pending group home acceptance back)  Expected Discharge Plan and Services  New group home once funding for paperwork is completed.                                             Social Determinants of Health (SDOH) Interventions SDOH Screenings   Alcohol Screen: Low Risk  (11/11/2020)  Depression (PHQ2-9): Low Risk  (03/17/2022)  Tobacco Use: Medium Risk (11/14/2022)    Readmission Risk Interventions     No data to display

## 2023-03-04 NOTE — ED Notes (Signed)
Pt requesting anxiety meds.  Meds given.  Pt tearful.  Pt states she is anxious about going to a new group home.  Pt also reports she believes she needs a new legal guardian because hers is not look after her.  Pt reports she did not shower today.  Her last shower was yesterday am per pt.  Pt's scrubs are dirty.

## 2023-03-04 NOTE — ED Provider Notes (Signed)
Emergency Medicine Observation Re-evaluation Note  Physical Exam   BP 109/80 (BP Location: Left Arm)   Pulse 80   Temp (!) 97.5 F (36.4 C) (Oral)   Resp 16   Ht 5\' 6"  (1.676 m)   Wt 90.9 kg   SpO2 98%   BMI 32.35 kg/m   Patient appears in no acute distress.  ED Course / MDM   No reported events during my shift at the time of this note.   Pt is awaiting dispo from SW   Pilar Jarvis MD    Pilar Jarvis, MD 03/04/23 [phone]

## 2023-03-04 NOTE — ED Notes (Signed)
Pt given dinner tray and beverage  

## 2023-03-04 NOTE — ED Notes (Signed)
VOL/Pending Placement 

## 2023-03-04 NOTE — ED Notes (Signed)
Pt given nighttime snack. 

## 2023-03-05 DIAGNOSIS — F4325 Adjustment disorder with mixed disturbance of emotions and conduct: Secondary | ICD-10-CM | POA: Diagnosis not present

## 2023-03-05 NOTE — Progress Notes (Signed)
This Chap visited the Pt as paged. Patient expressed some fear about night nurse and security after reading some parts of the Scriptures. Crystal [name] was able to educate Pt that she has no need to fear the said persons especially if they are assisting in her medical goals.   03/05/23 1400  Spiritual Encounters  Type of Visit Initial  Care provided to: Patient  Conversation partners present during encounter Nurse  Referral source Nurse (RN/NT/LPN)  Reason for visit Routine spiritual support  OnCall Visit Yes  Spiritual Framework  Presenting Themes Values and beliefs  Values/beliefs fear of evil  Interventions  Spiritual Care Interventions Made Explored values/beliefs/practices/strengths;Compassionate presence  Intervention Outcomes  Outcomes Reduced fear;Awareness of support  Spiritual Care Plan  Spiritual Care Issues Still Outstanding No further spiritual care needs at this time (see row info)

## 2023-03-05 NOTE — ED Provider Notes (Signed)
Emergency Medicine Observation Re-evaluation Note  Crystal [name] is a 32 y.o. female, seen on rounds today.  Pt initially presented to the ED for complaints of Back Pain, suicidal ideation, and Homicidal  Currently, the patient is is no acute distress. Denies any concerns at this time.  Physical Exam  Blood pressure 118/76, pulse 78, temperature 98.2 F (36.8 C), temperature source Oral, resp. rate 17, height 5\' 6"  (1.676 m), weight 90.9 kg, SpO2 97 %.  Physical Exam: General: No apparent distress      ED Course / MDM     I have reviewed the labs performed to date as well as medications administered while in observation.  Recent changes in the last 24 hours include: No acute events overnight.  Plan   Current plan: Patient awaiting social work placement    Corena Herter, MD 03/05/23 [phone]

## 2023-03-05 NOTE — ED Notes (Signed)
Pt given nighttime snack. 

## 2023-03-05 NOTE — ED Notes (Signed)
Given dinner

## 2023-03-06 DIAGNOSIS — F4325 Adjustment disorder with mixed disturbance of emotions and conduct: Secondary | ICD-10-CM | POA: Diagnosis not present

## 2023-03-06 NOTE — ED Notes (Signed)
Pt received night time snack tray with a cup of water in a foam cup along with a cup of vanilla ice cream. Pt took food to room to eat. Pt informed no other food will be given until breakfast time in the morning.

## 2023-03-06 NOTE — ED Notes (Signed)
VOL/ TOC 

## 2023-03-06 NOTE — ED Notes (Signed)
Pt provided blank paper and a dark crayon upon request.

## 2023-03-06 NOTE — ED Notes (Signed)
Pt provided a new blanket in exchange for her old blanket

## 2023-03-06 NOTE — ED Provider Notes (Signed)
Emergency Medicine Observation Re-evaluation Note  Crystal [name] is a 32 y.o. female, seen on rounds today.  Pt initially presented to the ED for complaints of Back Pain, suicidal ideation, and Homicidal  Currently, the patient is is no acute distress. Denies any concerns at this time.  Physical Exam   Vitals:   03/05/23 2029 03/06/23 0833  BP: 113/78 113/70  Pulse: 81 69  Resp: 18 16  Temp: 97.8 F (36.6 C) 98.2 F (36.8 C)  SpO2: 96% 95%     Physical Exam: Patient walking about the day room.  Conversant.  In no distress. General: No apparent distress      ED Course / MDM     I have reviewed the labs performed to date as well as medications administered while in observation.  Recent changes in the last 24 hours include: No acute events overnight.  Plan   Current plan: Patient awaiting social work placement   Sharyn Creamer, MD 03/06/23 [phone]

## 2023-03-06 NOTE — Progress Notes (Signed)
   03/06/23 1500  Spiritual Encounters  Type of Visit Initial  Care provided to: Patient  Conversation partners present during encounter Nurse  Referral source Patient request  Reason for visit Routine spiritual support  OnCall Visit Yes   Chaplain provided a Bible per patient request.

## 2023-03-06 NOTE — ED Notes (Addendum)
Pt received meal tray and beverage at this time. 

## 2023-03-06 NOTE — ED Notes (Signed)
Pt taking shower. Pt was given hygiene items and the following, 1 clean top, 1 clean bottom, with 1 pair of disposable underwear.  Pt changed out into clean clothing.  Staff disposed of all shower supplies.   

## 2023-03-06 NOTE — ED Notes (Signed)
Pt given dinner tray.

## 2023-03-06 NOTE — ED Notes (Signed)
Hospital meal provided, pt tolerated w/o complaints.  Waste discarded appropriately.  

## 2023-03-06 NOTE — ED Notes (Signed)
Snack was provided. 

## 2023-03-07 DIAGNOSIS — F4325 Adjustment disorder with mixed disturbance of emotions and conduct: Secondary | ICD-10-CM | POA: Diagnosis not present

## 2023-03-07 NOTE — ED Notes (Signed)

## 2023-03-07 NOTE — ED Notes (Signed)
VS obtained. Snack received by pt. Pt brought snack to room to eat.  

## 2023-03-07 NOTE — ED Provider Notes (Signed)
Emergency Medicine Observation Re-evaluation Note  Crystal [name] is a 32 y.o. female, seen on rounds today.  Pt initially presented to the ED for complaints of Back Pain, suicidal ideation, and Homicidal No acute issues overnight per nursing staff.  Physical Exam  BP 122/61 (BP Location: Left Arm)   Pulse (!) 112   Temp 99 F (37.2 C) (Oral)   Resp 18   Ht 5\' 6"  (1.676 m)   Wt 90.9 kg   SpO2 94%   BMI 32.35 kg/m  Physical Exam  Patient appears well, no acute distress, normal WOB    ED Course / MDM  EKG:   I have reviewed the labs performed to date as well as medications administered while in observation.  Recent changes in the last 24 hours include none.  Plan  Current plan is for dispo per social work.    Chesley Noon, MD 03/07/23 [phone]

## 2023-03-07 NOTE — ED Notes (Signed)
Pt given lunch tray.

## 2023-03-07 NOTE — ED Notes (Signed)
Pt given comb to brush hair. Pt reminded to stay in dayroom until finished with comb.

## 2023-03-07 NOTE — ED Notes (Signed)
Pt at door asking for something for acid reflux. Meds given

## 2023-03-07 NOTE — ED Notes (Signed)
Pt given snack and juice at this time. 

## 2023-03-07 NOTE — ED Notes (Signed)
Pt given shower supplies and change of scrubs. Pt currently in shower.

## 2023-03-07 NOTE — ED Notes (Signed)
Pt given breakfast tray and juice at this time. No other needs voiced. 

## 2023-03-07 NOTE — ED Notes (Signed)
Pt given dinner tray.

## 2023-03-07 NOTE — ED Notes (Addendum)
Pt returned comb to this RN, placed in pt specific box. Pt also asking for paper to color on, provided with same as well as a new testament bible. Chaplain paged.

## 2023-03-08 DIAGNOSIS — F4325 Adjustment disorder with mixed disturbance of emotions and conduct: Secondary | ICD-10-CM | POA: Diagnosis not present

## 2023-03-08 NOTE — TOC Progression Note (Signed)
Transition of Care [facility]) - Progression Note    Patient Details  Name: Crystal [name] MRN: [id number] Date of Birth: 07-26-1991  Transition of Care [facility]) CM/SW Contact  Darleene Cleaver, [place] Phone Number: 03/08/2023, 6:05 PM  Clinical Narrative:     Per [employer], patient's funding request has been partially approved, however the group home needs to modify a few things and resubmit.  TOC to follow up at a later time.   Expected Discharge Plan: Group Home Barriers to Discharge:  (pending group home acceptance back)  Expected Discharge Plan and Services                                               Social Determinants of Health (SDOH) Interventions SDOH Screenings   Alcohol Screen: Low Risk  (11/11/2020)  Depression (PHQ2-9): Low Risk  (03/17/2022)  Tobacco Use: Medium Risk (11/14/2022)    Readmission Risk Interventions     No data to display

## 2023-03-08 NOTE — ED Notes (Signed)
Pt given dinner tray and drink at this time.

## 2023-03-08 NOTE — ED Notes (Signed)
Pt given snack and water

## 2023-03-08 NOTE — ED Notes (Signed)
Pt reports resident in room 3 is staring into her room at times   All patients informed to respect privacy among other residents.

## 2023-03-08 NOTE — ED Notes (Signed)
VOL/Pending Placement 

## 2023-03-08 NOTE — ED Notes (Signed)
Patient took all po medications, no signs of distress, denies Si/hi or avh, will continue to monitor for safety.

## 2023-03-08 NOTE — ED Notes (Signed)

## 2023-03-08 NOTE — ED Provider Notes (Signed)
Emergency Medicine Observation Re-evaluation Note  Crystal [name] is a 32 y.o. female, seen on rounds today.  Pt initially presented to the ED for complaints of Back Pain, suicidal ideation, and Homicidal No acute issues overnight per nursing staff.  Physical Exam  BP (!) 88/65 (BP Location: Right Arm)   Pulse 88   Temp 98.3 F (36.8 C)   Resp 17   Ht 5\' 6"  (1.676 m)   Wt 90.9 kg   SpO2 98%   BMI 32.35 kg/m  Physical Exam  Currently sleeping in no distress.   ED Course / MDM  EKG:   I have reviewed the labs performed to date as well as medications administered while in observation.  Recent changes in the last 24 hours include none.  Plan  Current plan is for dispo per social work.   Sharyn Creamer, MD 03/08/23 [phone]

## 2023-03-09 DIAGNOSIS — F4325 Adjustment disorder with mixed disturbance of emotions and conduct: Secondary | ICD-10-CM | POA: Diagnosis not present

## 2023-03-09 NOTE — ED Notes (Signed)
Dinner provided to patient

## 2023-03-09 NOTE — ED Notes (Signed)
Lunch tray and beverage provided 

## 2023-03-09 NOTE — ED Notes (Signed)
Pt provided snack and drink. 

## 2023-03-09 NOTE — ED Notes (Signed)
VOL  TOC  PLACEMENT 

## 2023-03-09 NOTE — ED Notes (Signed)
Pt calm and cooperative at this time. Pt given paper per pt request.

## 2023-03-09 NOTE — ED Notes (Signed)
Patient is in her room coloring on paper. Patient stated that she is not in any distress

## 2023-03-10 DIAGNOSIS — F4325 Adjustment disorder with mixed disturbance of emotions and conduct: Secondary | ICD-10-CM | POA: Diagnosis not present

## 2023-03-10 LAB — SARS CORONAVIRUS 2 BY RT PCR: SARS Coronavirus 2 by RT PCR: NEGATIVE

## 2023-03-10 LAB — GROUP A STREP BY PCR: Group A Strep by PCR: DETECTED — AB

## 2023-03-10 MED ORDER — MENTHOL 3 MG MT LOZG
1.0000 | LOZENGE | OROMUCOSAL | Status: AC | PRN
  Administered 2023-03-10 – 2023-03-17 (×10): 3 mg via ORAL
  Filled 2023-03-10 (×2): qty 9

## 2023-03-10 MED ORDER — ACETAMINOPHEN 325 MG PO TABS
650.0000 mg | ORAL_TABLET | Freq: Once | ORAL | Status: AC
  Administered 2023-03-10: 650 mg via ORAL
  Filled 2023-03-10: qty 2

## 2023-03-10 MED ORDER — AMOXICILLIN 500 MG PO CAPS
500.0000 mg | ORAL_CAPSULE | Freq: Two times a day (BID) | ORAL | Status: AC
  Administered 2023-03-10 – 2023-03-19 (×20): 500 mg via ORAL
  Filled 2023-03-10 (×22): qty 1

## 2023-03-10 NOTE — ED Notes (Signed)
Pt comes to nurses station and reports that she vomited when she used bathroom earlier and would like soft food.  No nausea at this time.  First shift was not aware that pt had vomited.  Will order pt some soup for dinner per pt request.  Gave pt a new scrub top.  No distress at this time

## 2023-03-10 NOTE — ED Notes (Signed)
VOL  TOC  PLACEMENT 

## 2023-03-10 NOTE — ED Notes (Signed)
Call to cafeteria to order soup for pt as she reports needing something soft due to strep throat

## 2023-03-10 NOTE — ED Notes (Signed)
Pt participated in gave with other patients and staff in day room.  Pt is laughing a lot and appears in no distress.  Pt requests cough drop after

## 2023-03-10 NOTE — ED Notes (Signed)
VOL/Pending Placement 

## 2023-03-10 NOTE — ED Notes (Signed)
MD assessed patient , patient remained calm and cooperative, no signs of distress.

## 2023-03-10 NOTE — ED Notes (Signed)
Paper given for coloring.

## 2023-03-10 NOTE — ED Notes (Signed)
Patient is awake, complaining of sore throat and headache, she states that she wants to see the Doctor, nurse will notify MD. Patient denies Si/hi or avh, staff will continue to monitor for safety.

## 2023-03-10 NOTE — ED Notes (Signed)
Pt ate the two soups and saltine crackers.  Pt appears in no distress, she has been very happy and laughing in day room

## 2023-03-10 NOTE — ED Notes (Signed)
Patient provided with PM snack.  Given pretzels and yogurt.

## 2023-03-10 NOTE — ED Notes (Signed)
Pt stripped her bed and is requesting sheets and pillow cases.  Gave her both but advised that this will have to happen in the am per protocol.

## 2023-03-10 NOTE — ED Notes (Signed)
Patient positive for Strep throat, MD aware and new orders viewed per nurse and will be carried out.

## 2023-03-10 NOTE — ED Notes (Signed)
Patient ate 100% of lunch and beverage.  

## 2023-03-11 DIAGNOSIS — F4325 Adjustment disorder with mixed disturbance of emotions and conduct: Secondary | ICD-10-CM | POA: Diagnosis not present

## 2023-03-11 NOTE — ED Notes (Signed)
Patient requested nursing staff call Crystal [name] and request she come see patient to talk about patient's legal guardian. Called Crystal [name], but she is out of office until 03/15/23.

## 2023-03-11 NOTE — ED Notes (Signed)
Spoke to legal guardian, no new updates.

## 2023-03-11 NOTE — Progress Notes (Signed)
   03/11/23 1500  Spiritual Encounters  Type of Visit Follow up  Care provided to: Patient  Conversation partners present during encounter Nurse  Referral source Patient request  Reason for visit Routine spiritual support  OnCall Visit No   Pt requested a Bible. Chaplain brought pt a Bible

## 2023-03-11 NOTE — ED Notes (Signed)
VOL/Pending TOC Placement 

## 2023-03-11 NOTE — ED Notes (Signed)
Patient requested throat lozenge for sore throat. Lozenge given as prn ordered.

## 2023-03-11 NOTE — ED Notes (Signed)
Chaplain visiting with patient

## 2023-03-11 NOTE — ED Provider Notes (Signed)
Emergency Medicine Observation Re-evaluation Note  Crystal [name] is a 32 y.o. female, seen on rounds today.  Pt initially presented to the ED for complaints of Back Pain, suicidal ideation, and Homicidal  Currently, the patient is calm, no acute complaints.  Physical Exam  Blood pressure 105/75, pulse 88, temperature 98.8 F (37.1 C), temperature source Oral, resp. rate 17, height 5\' 6"  (1.676 m), weight 90.9 kg, SpO2 97 %. Physical Exam General: NAD Lungs: CTAB Psych: not agitated  ED Course / MDM  EKG:    I have reviewed the labs performed to date as well as medications administered while in observation.  Recent changes in the last 24 hours include no acute events overnight.    Plan  Current plan is for SW placement   Sharman Cheek, MD 03/11/23 1000
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
## 2023-03-12 DIAGNOSIS — F4325 Adjustment disorder with mixed disturbance of emotions and conduct: Secondary | ICD-10-CM | POA: Diagnosis not present

## 2023-03-12 NOTE — ED Notes (Signed)
Snack provided

## 2023-03-12 NOTE — ED Notes (Signed)
VOL/TOC Placement 

## 2023-03-12 NOTE — TOC Progression Note (Signed)
Transition of Care [facility]) - Progression Note    Patient Details  Name: Crystal [name] MRN: [id number] Date of Birth: March 14, 1991  Transition of Care [facility]) CM/SW Contact  Darleene Cleaver, [place] Phone Number: 03/12/2023, 7:47 PM  Clinical Narrative:     Per [facility] still waiting, for contract to be completed for placement.  Expected Discharge Plan: Group Home Barriers to Discharge:  (pending group home acceptance back)  Expected Discharge Plan and Services                                               Social Determinants of Health (SDOH) Interventions SDOH Screenings   Alcohol Screen: Low Risk  (11/11/2020)  Depression (PHQ2-9): Low Risk  (03/17/2022)  Tobacco Use: Medium Risk (11/14/2022)    Readmission Risk Interventions     No data to display

## 2023-03-12 NOTE — ED Notes (Signed)
Vol/toc.. 

## 2023-03-12 NOTE — ED Provider Notes (Signed)
Emergency Medicine Observation Re-evaluation Note  Crystal [name] is a 32 y.o. female, seen on rounds today.  Pt initially presented to the ED for complaints of Back Pain, suicidal ideation, and Homicidal  Currently, the patient is is no acute distress. Denies any concerns at this time.  Physical Exam  Blood pressure (!) 110/54, pulse 73, temperature 98.8 F (37.1 C), temperature source Oral, resp. rate 18, height 5\' 6"  (1.676 m), weight 90.9 kg, SpO2 96 %.  Physical Exam: General: No apparent distress      ED Course / MDM     I have reviewed the labs performed to date as well as medications administered while in observation.  Recent changes in the last 24 hours include: No acute events overnight.  Plan   Current plan: Patient awaiting SW    Corena Herter, MD 03/12/23 [phone]

## 2023-03-12 NOTE — ED Notes (Signed)
Pt given lunch tray and beverage 

## 2023-03-12 NOTE — ED Notes (Signed)
Given  breakfast

## 2023-03-13 DIAGNOSIS — F4325 Adjustment disorder with mixed disturbance of emotions and conduct: Secondary | ICD-10-CM | POA: Diagnosis not present

## 2023-03-13 NOTE — Progress Notes (Signed)
  Chaplain On-Call responded to a page from RN Lambert Mody, who reported the patient's request to speak with a Chaplain.  Chaplain met with Crystal [name] in her room #2, and recalled other visits as she is well-known to our Chaplains.  Crystal [name] described an intrusive dream from last night, that she is convinced was sent to her by the Devil. She stated that although she prays and talks to God, she feels the strong presence of the Devil which challenges her faith. Crystal [name] reads the Bible as a means of coping with this difficult occurrence.  Chaplain provided much spiritual and emotional support, and prayer with Crystal [name].  Chaplain Evelena Peat M.Div., [facility]

## 2023-03-13 NOTE — ED Notes (Signed)
Received report from tiffany, rn 

## 2023-03-13 NOTE — ED Notes (Signed)
Patient is vol pending toc placement 

## 2023-03-13 NOTE — ED Notes (Signed)
Pt given shower 

## 2023-03-13 NOTE — ED Provider Notes (Signed)
Emergency Medicine Observation Re-evaluation Note  Crystal [name] is a 32 y.o. female, seen on rounds today.  Pt initially presented to the ED for complaints of Back Pain, suicidal ideation, and Homicidal  Currently, the patient is resting comfortably.  Physical Exam  BP (!) 140/95 (BP Location: Right Arm)   Pulse 87   Temp 97.8 F (36.6 C) (Oral)   Resp 18   Ht 5\' 6"  (1.676 m)   Wt 90.9 kg   SpO2 96%   BMI 32.35 kg/m  General: No acute distress Cardiac: Well-perfused extremities Lungs: No respiratory distress Psych: Appropriate mood and affect  ED Course / MDM  EKG:   I have reviewed the labs performed to date as well as medications administered while in observation.  Recent changes in the last 24 hours include none.  Plan  Current plan is for placement.   Merwyn Katos, MD 03/13/23 [phone]

## 2023-03-14 DIAGNOSIS — F4325 Adjustment disorder with mixed disturbance of emotions and conduct: Secondary | ICD-10-CM | POA: Diagnosis not present
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
## 2023-03-14 NOTE — ED Notes (Signed)
Patient received snack.  

## 2023-03-14 NOTE — ED Notes (Signed)
Dinner tray and beverage delivered to pt. PT has no other needs at this time.  

## 2023-03-14 NOTE — ED Provider Notes (Signed)
Emergency Medicine Observation Re-evaluation Note  Crystal [name] is a 32 y.o. female, seen on rounds today.  Pt initially presented to the ED for complaints of Back Pain, suicidal ideation, and Homicidal   Physical Exam  BP 126/76 (BP Location: Right Arm)   Pulse 88   Temp 98 F (36.7 C) (Oral)   Resp 18   Ht 5\' 6"  (1.676 m)   Wt 90.9 kg   SpO2 95%   BMI 32.35 kg/m  Physical Exam General: nad  ED Course / MDM  EKG:   I have reviewed the labs performed to date as well as medications administered while in observation.   Plan  Current plan is for placement.    Willy Eddy, MD 03/14/23 [phone]

## 2023-03-14 NOTE — ED Notes (Signed)
Patient is vol pending TOC placement 

## 2023-03-15 DIAGNOSIS — F4325 Adjustment disorder with mixed disturbance of emotions and conduct: Secondary | ICD-10-CM | POA: Diagnosis not present

## 2023-03-15 NOTE — ED Notes (Signed)
Pt was given additional paper.

## 2023-03-15 NOTE — ED Notes (Signed)

## 2023-03-15 NOTE — ED Notes (Signed)
Meal tray given 

## 2023-03-15 NOTE — ED Notes (Signed)
Pt upset and crying. Pt states that she is feeling anxious. Pt reports that she is feeling anxious because Nurse Toniann Fail did not call Crystal [name] who is a Merchandiser, retail at [employer]. Pt reports that she wants to speak to her about her legal guardian. Pt was offer PRN anxiety medication and states that she would like to take the medication.

## 2023-03-15 NOTE — ED Notes (Signed)
VOL  TOC  PLACEMENT 

## 2023-03-15 NOTE — ED Notes (Signed)
Pt is calm and cooperative now after playing a game of uno with staff and other patients. She is asking me to leave a message for the DSS supervisor Irving Burton as she would like a new guardian as she feels that the guardian she currently has is not advocating for her and she states that the current guardian "lied" when they had a group meeting.  Pt is calm at time and she has a paper which she has written her concern that she would like passed along to the DSS supervisor.

## 2023-03-15 NOTE — ED Notes (Signed)
Patient receive lunch tray and beverage.

## 2023-03-16 DIAGNOSIS — F4325 Adjustment disorder with mixed disturbance of emotions and conduct: Secondary | ICD-10-CM | POA: Diagnosis not present

## 2023-03-16 NOTE — TOC Progression Note (Signed)
Transition of Care [facility]) - Progression Note    Patient Details  Name: Crystal [name] MRN: [id number] Date of Birth: Sep 21, 1991  [facility]) CM/SW Contact  Darleene Cleaver, [place] Phone Number: 03/16/2023, 11:40 AM  Clinical Narrative:     CSW attempted to contact Denton Meek ate [facility] [phone] to get an update on placement, awaiting for a call back.   Expected Discharge Plan: Group Home Barriers to Discharge:  (pending group home acceptance back)  Expected Discharge Plan and Services                                               Social Determinants of Health (SDOH) Interventions SDOH Screenings   Alcohol Screen: Low Risk  (11/11/2020)  Depression (PHQ2-9): Low Risk  (03/17/2022)  Tobacco Use: Medium Risk (11/14/2022)    Readmission Risk Interventions     No data to display

## 2023-03-16 NOTE — ED Notes (Signed)
Pt had pulled chair in room into hallway. Pt instructed by this RN to place chair back into room. Pt asked another patient on unit to help her push chair back into room. This RN entered room as another patient was pushing chair back into room for stephanie. Patient instructed not allowed in other patients room. Pt was only pushing chair back into room when asked by [name]. Other patient calmly left room and apologized as he did not know. Other patient never touched or approached [place] during interaction.

## 2023-03-16 NOTE — ED Notes (Signed)
Patient requests Crystal [name] be called. This RN attempted to call her, no answer at this time, HIPAA compliant message left requesting return call.

## 2023-03-17 DIAGNOSIS — F4325 Adjustment disorder with mixed disturbance of emotions and conduct: Secondary | ICD-10-CM | POA: Diagnosis not present

## 2023-03-17 NOTE — ED Notes (Signed)
Pt given dinner tray and drink at this time.

## 2023-03-17 NOTE — ED Notes (Signed)
VOL/ TOC 

## 2023-03-17 NOTE — ED Notes (Signed)

## 2023-03-17 NOTE — ED Notes (Signed)
Pt given snack and drink. 

## 2023-03-17 NOTE — ED Notes (Signed)
vol/toc placement.. 

## 2023-03-17 NOTE — ED Notes (Signed)
Pt given lotion. 

## 2023-03-17 NOTE — ED Provider Notes (Signed)
Emergency Medicine Observation Re-evaluation Note  Physical Exam   BP 109/73 (BP Location: Left Arm)   Pulse 69   Temp 98.2 F (36.8 C) (Oral)   Resp 18   Ht 5\' 6"  (1.676 m)   Wt 90.9 kg   SpO2 96%   BMI 32.35 kg/m   Patient appears in no acute distress.  ED Course / MDM   No reported events during my shift at the time of this note.   Pt is awaiting dispo from SW   Pilar Jarvis MD    Pilar Jarvis, MD 03/17/23 [phone]

## 2023-03-17 NOTE — ED Notes (Signed)
PM snack provided. 

## 2023-03-18 DIAGNOSIS — F4325 Adjustment disorder with mixed disturbance of emotions and conduct: Secondary | ICD-10-CM | POA: Diagnosis not present

## 2023-03-18 NOTE — ED Notes (Signed)
Dinner tray given to patient at this time.  

## 2023-03-18 NOTE — ED Notes (Signed)
VOL/pending placement 

## 2023-03-18 NOTE — ED Notes (Signed)
Punch tray provided with water

## 2023-03-18 NOTE — ED Notes (Signed)
Pt put dirty linens in the linen hamper and given clean linens to make their bed. Pt has a steady, even gait and appears to be in NAD.

## 2023-03-18 NOTE — ED Notes (Signed)
Pt given snack and water

## 2023-03-18 NOTE — ED Notes (Signed)
Breakfast tray and juice provided 

## 2023-03-18 NOTE — ED Notes (Signed)
Patient given snack at this time

## 2023-03-18 NOTE — ED Notes (Signed)
Peaches and water provided for snack  

## 2023-03-19 DIAGNOSIS — F4325 Adjustment disorder with mixed disturbance of emotions and conduct: Secondary | ICD-10-CM | POA: Diagnosis not present

## 2023-03-19 NOTE — TOC Progression Note (Signed)
Transition of Care [facility]) - Progression Note    Patient Details  Name: Janyth Marrese MRN: [id number] Date of Birth: 1991-04-29  Transition of Care [facility]) CM/SW Contact  Darleene Cleaver, [place] Phone Number: 03/19/2023, 10:40 PM  Clinical Narrative:     Left another message with Stacy at [facility], waiting for call back regarding status of placement.  Expected Discharge Plan: Group Home Barriers to Discharge:  (pending group home acceptance back)  Expected Discharge Plan and Services                                               Social Determinants of Health (SDOH) Interventions SDOH Screenings   Alcohol Screen: Low Risk  (11/11/2020)  Depression (PHQ2-9): Low Risk  (03/17/2022)  Tobacco Use: Medium Risk (11/14/2022)    Readmission Risk Interventions     No data to display

## 2023-03-19 NOTE — ED Notes (Signed)
Pt given her breakfast tray.  

## 2023-03-19 NOTE — ED Notes (Signed)
vol/toc.... 

## 2023-03-19 NOTE — ED Provider Notes (Signed)
Emergency Medicine Observation Re-evaluation Note  Crystal [name] is a 32 y.o. female, seen on rounds today.  Pt initially presented to the ED for complaints of Back Pain, suicidal ideation, and Homicidal  Currently, the patient is resting in bed. No reported issues overnight from nursing team.   Physical Exam  BP 116/65 (BP Location: Left Arm)   Pulse 90   Temp 97.7 F (36.5 C) (Oral)   Resp 16   Ht 5\' 6"  (1.676 m)   Wt 90.9 kg   SpO2 96%   BMI 32.35 kg/m  Physical Exam General: Resting comfortably in bed  ED Course / MDM    Plan  Current plan is for dispo per social work.    Trinna Post, MD 03/19/23 [phone]

## 2023-03-20 DIAGNOSIS — F4325 Adjustment disorder with mixed disturbance of emotions and conduct: Secondary | ICD-10-CM | POA: Diagnosis not present

## 2023-03-20 NOTE — ED Provider Notes (Signed)
Emergency Medicine Observation Re-evaluation Note  Crystal [name] is a 32 y.o. female, boarding in the emergency department.  No acute events since last update  Physical Exam  BP 118/61 (BP Location: Left Arm)   Pulse 71   Temp 98.2 F (36.8 C) (Oral)   Resp 17   Ht 5\' 6"  (1.676 m)   Wt 90.9 kg   SpO2 95%   BMI 32.35 kg/m   ED Course / MDM   No recent lab work available for review.  Plan  Current plan is for placement to an appropriate living facility once available.  Social worker is currently working with the patient to achieve this.    [name], MD 03/20/23 [phone]

## 2023-03-20 NOTE — ED Notes (Signed)
Pt was given snack. 

## 2023-03-20 NOTE — ED Notes (Addendum)
This NT provided pt with dinner tray with drink.

## 2023-03-20 NOTE — ED Notes (Signed)
VOL/ TOC 

## 2023-03-20 NOTE — ED Notes (Signed)

## 2023-03-21 DIAGNOSIS — F4325 Adjustment disorder with mixed disturbance of emotions and conduct: Secondary | ICD-10-CM | POA: Diagnosis not present

## 2023-03-21 NOTE — ED Notes (Signed)
Patient requesting towel to wipe her hands

## 2023-03-21 NOTE — ED Notes (Signed)
TOC

## 2023-03-21 NOTE — ED Notes (Signed)
Patient given pretzels

## 2023-03-21 NOTE — ED Notes (Signed)
Patient requesting medication for anxiety.See MAR.

## 2023-03-21 NOTE — ED Notes (Signed)
Pt was given snack. 

## 2023-03-21 NOTE — ED Notes (Signed)
Patient requesting tv to be changed in her room

## 2023-03-21 NOTE — ED Notes (Signed)
Patient received shower supplies and is currently showering.

## 2023-03-21 NOTE — ED Notes (Signed)
Patient received breakfast tray and juice to drink.

## 2023-03-21 NOTE — ED Notes (Addendum)
Secretary paged chaplain to speak with patient

## 2023-03-21 NOTE — ED Provider Notes (Signed)
Emergency Medicine Observation Re-evaluation Note  Crystal [name] is a 32 y.o. female, boarding in the emergency department.  No acute events since last update  Physical Exam  BP 103/74 (BP Location: Right Arm)   Pulse 76   Temp 98.3 F (36.8 C) (Oral)   Resp 18   Ht 5\' 6"  (1.676 m)   Wt 90.9 kg   SpO2 98%   BMI 32.35 kg/m   Patient is currently sleeping in room without distress  ED Course / MDM   No recent lab work available for review.  Plan  Current plan is for placement to an appropriate living facility once available.  Social worker is currently working with the patient to achieve this.      Sharyn Creamer, MD 03/21/23 [phone]

## 2023-03-21 NOTE — ED Notes (Signed)
Hospital meal provided, pt tolerated w/o complaints.  Waste discarded appropriately.  

## 2023-03-21 NOTE — ED Notes (Signed)
Pt tearful on arrival due to feeling disrespected by prior shift despite her behavior. Pt vents to nurse shortly but is reassured of new staff present. Pt calms and discusses her emotions and thoughts with staff, pt returns to room and tv changed to channel of preference. Calm and cooperative at this time

## 2023-03-21 NOTE — ED Notes (Signed)
Patient is vol pending TOC placement 

## 2023-03-22 DIAGNOSIS — F4325 Adjustment disorder with mixed disturbance of emotions and conduct: Secondary | ICD-10-CM | POA: Diagnosis not present

## 2023-03-22 NOTE — ED Notes (Signed)
Vol/toc.. 

## 2023-03-22 NOTE — ED Provider Notes (Signed)
Emergency Medicine Observation Re-evaluation Note  Physical Exam   BP 119/70 (BP Location: Right Arm)   Pulse 77   Temp 98.2 F (36.8 C) (Oral)   Resp 18   Ht 5\' 6"  (1.676 m)   Wt 90.9 kg   SpO2 98%   BMI 32.35 kg/m   Patient appears in no acute distress.  ED Course / MDM   No reported events during my shift at the time of this note.   Pt is awaiting dispo from SW   Pilar Jarvis MD    Pilar Jarvis, MD 03/22/23 [phone]

## 2023-03-22 NOTE — ED Notes (Signed)
Snack given.

## 2023-03-22 NOTE — TOC Progression Note (Signed)
Transition of Care [facility]) - Progression Note    Patient Details  Name: Crystal [name] MRN: [id number] Date of Birth: 1991-06-30  [facility] Care [facility]) CM/SW Contact  Darleene Cleaver, [place] Phone Number: 03/22/2023, 5:25 PM  Clinical Narrative:     Email received from Corey Skains [facility] Manager, she was trying to contact Denton Meek to get an update on where patient is for paperwork to be completed.  Waiting for an email back with update from [facility].   Expected Discharge Plan: Group Home Barriers to Discharge:  (pending group home acceptance back)  Expected Discharge Plan and Services                                               Social Determinants of Health (SDOH) Interventions SDOH Screenings   Alcohol Screen: Low Risk  (11/11/2020)  Depression (PHQ2-9): Low Risk  (03/17/2022)  Tobacco Use: Medium Risk (11/14/2022)    Readmission Risk Interventions     No data to display

## 2023-03-22 NOTE — ED Notes (Signed)
Pt awake now, provided breakfast and obtaining VS.

## 2023-03-22 NOTE — ED Notes (Signed)
Pt taking a shower 

## 2023-03-23 DIAGNOSIS — F4325 Adjustment disorder with mixed disturbance of emotions and conduct: Secondary | ICD-10-CM | POA: Diagnosis not present

## 2023-03-23 LAB — VALPROIC ACID LEVEL: Valproic Acid Lvl: 22 ug/mL — ABNORMAL LOW (ref 50.0–100.0)

## 2023-03-23 MED ORDER — DIVALPROEX SODIUM 250 MG PO DR TAB
250.0000 mg | DELAYED_RELEASE_TABLET | Freq: Two times a day (BID) | ORAL | Status: DC
  Administered 2023-03-23 – 2023-03-31 (×16): 250 mg via ORAL
  Filled 2023-03-23 (×16): qty 1

## 2023-03-23 NOTE — ED Notes (Signed)
vol/toc.... 
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
## 2023-03-23 NOTE — ED Notes (Signed)
Pt received snack and water. 

## 2023-03-23 NOTE — ED Notes (Signed)
Pt alert and calmly walking through DR.

## 2023-03-23 NOTE — ED Notes (Signed)
Pt alert, calm and cooperative with staff.

## 2023-03-23 NOTE — ED Notes (Signed)
Pt. Alert and oriented, warm and dry, in no distress. Pt. Denies SI, HI, and AVH. No complaints.   ENVIRONMENTAL ASSESSMENT Potentially harmful objects out of patient reach: Yes.   Personal belongings secured: Yes.   Patient dressed in hospital provided attire only: Yes.   Plastic bags out of patient reach: Yes.   Patient care equipment (cords, cables, call bells, lines, and drains) shortened, removed, or accounted for: Yes.   Equipment and supplies removed from bottom of stretcher: Yes.   Potentially toxic materials out of patient reach: Yes.   Sharps container removed or out of patient reach: Yes.

## 2023-03-23 NOTE — TOC Progression Note (Signed)
Transition of Care [facility]) - Progression Note    Patient Details  Name: Crystal [name] MRN: [id number] Date of Birth: [birth year]  Transition of Care [facility]) CM/SW Contact  Darleene Cleaver, [place] Phone Number: 03/23/2023, 6:40 PM  Clinical Narrative:     CSW received an email from Denton Meek at [facility] stating that the paperwork has been received for the enhance rate for patient.  Once it is review, patient may be able to go to new group home next Monday, pending final approval.  CSW to continue to follow patient's progress throughout discharge planning.  Expected Discharge Plan: Group Home Barriers to Discharge:  (pending group home acceptance back)  Expected Discharge Plan and Services                                               Social Determinants of Health (SDOH) Interventions SDOH Screenings   Alcohol Screen: Low Risk  (11/11/2020)  Depression (PHQ2-9): Low Risk  (03/17/2022)  Tobacco Use: Medium Risk (11/14/2022)    Readmission Risk Interventions     No data to display

## 2023-03-23 NOTE — Progress Notes (Signed)
   03/23/23 1200  Spiritual Encounters  Type of Visit Follow up  Care provided to: Patient  Referral source Nurse (RN/NT/LPN)  Reason for visit Routine spiritual support  OnCall Visit Yes  Spiritual Framework  Presenting Themes Coping tools;Courage hope and growth  Interventions  Spiritual Care Interventions Made Reconciliation with self/others   Chaplain responded to page to provide conversation and care after patient acted out by throwing juice. Provision made for patient to vent and explore negative and harmful thinking processes. Coping mechanisms explored and discussed.

## 2023-03-23 NOTE — ED Notes (Signed)
Pt to nurses station to take scheduled medications. Pt informs this nurse "I don't want to live anymore. Pt reassured by staff and encouraged to speak with the chaplin. Pt tearful and states I do not want to go to a group home. This nurse verbalized understanding.

## 2023-03-23 NOTE — ED Notes (Signed)
Pt eating from her lunch tray. Has cup of water.

## 2023-03-23 NOTE — ED Notes (Signed)
Pt given breakfast and apple juice. Pt appears alert and calm at this time.

## 2023-03-23 NOTE — ED Notes (Signed)
Snack and small cup of water provided

## 2023-03-23 NOTE — ED Notes (Signed)
Chaplain visiting with pt now. Security present.

## 2023-03-23 NOTE — ED Notes (Addendum)
Dinner tray provided with small cup of water

## 2023-03-23 NOTE — ED Notes (Signed)
Pt to nurses station and states she would like this nurse to tell the social worker to get in touch with her guardian and let her know that she does not want to go back to a group home and that she would rather go to an institution. Pt states if she goes to a group home she will run away again. Pt states she does not want to see her guardian anymore but would like "eric" to talk to her and then talk to her on the phone.

## 2023-03-23 NOTE — ED Provider Notes (Signed)
Emergency Medicine Observation Re-evaluation Note   Physical Exam  BP 110/63 (BP Location: Right Arm)   Pulse 82   Temp 98.7 F (37.1 C) (Oral)   Resp 16   Ht 5\' 6"  (1.676 m)   Wt 90.9 kg   SpO2 97%   BMI 32.35 kg/m   The patient is resting comfortably, in no distress.  ED Course / MDM   There have been no reported events since the last update.  The patient is awaiting disposition from TOC.     Dionne Bucy, MD 03/23/23 [phone]

## 2023-03-23 NOTE — ED Notes (Signed)
Pt walked over to security officer on unit and threw her cup of apple juice on him and then walked away without her breakfast back to her room. Pt then returned and asked to use the bathroom and said "I need to take a shit". Pt told she would have to wait for the other patients to leave the dayroom so the door could remain open to the restroom. Due to pt previously taking her stool from the toilet and smearing it on the glass window of the nurses station just after throwing her cup of water on staff, pt informed she will have to use the restroom with door open so nursing staff can ensure she is using restroom appropriately. Pt verbalized understanding.

## 2023-03-23 NOTE — ED Notes (Signed)
Pt to nurses station and hands two small pieces of crayon out of her shirt pocket. Pt states "Crystal [name], I need to give you these crayons. I am not allowed to have them anymore because I just wrote on the wall again." Pt then repeats that she does not want to go back a group home and needs to go to an institution.

## 2023-03-24 DIAGNOSIS — F4325 Adjustment disorder with mixed disturbance of emotions and conduct: Secondary | ICD-10-CM | POA: Diagnosis not present

## 2023-03-24 NOTE — ED Provider Notes (Signed)
Emergency Medicine Observation Re-evaluation Note  Crystal [name] is a 32 y.o. female, seen on rounds today.  Pt initially presented to the ED for complaints of Back Pain, suicidal ideation, and Homicidal  Currently, the patient is resting in bed. No reported issues overnight from nursing team.  Physical Exam  BP 99/76 (BP Location: Right Arm)   Pulse 74   Temp 98.3 F (36.8 C) (Oral)   Resp 16   Ht 5\' 6"  (1.676 m)   Wt 90.9 kg   SpO2 97%   BMI 32.35 kg/m  Physical Exam General: Resting comfortably in bed  ED Course / MDM    Plan  Current plan is for dispo per social work. Psychiatry consult ordered yesterday by physician in setting of SI.    Trinna Post, MD 03/24/23 [phone]

## 2023-03-24 NOTE — ED Notes (Signed)

## 2023-03-24 NOTE — Consult Note (Addendum)
[facility] Face-to-Face Psychiatry Consult   Reason for Consult:  Patient now reporting SI Referring Physician:  Dionne Bucy, MD Patient Identification: Crystal [name] MRN:  [id number] Principal Diagnosis: Adjustment disorder with mixed disturbance of emotions and conduct Diagnosis:  Principal Problem:   Adjustment disorder with mixed disturbance of emotions and conduct Active Problems:   Suicidal ideation   Total Time spent with patient: 30 minutes  Subjective:   Crystal [name] is a 32 y.o. female patient admitted with "the devil get in my mind and gets me to do and say bad things".  HPI:  Patient seen face to face by this provider, consulted with emergency department physician Dr. Rosalia Hammers; and chart reviewed on 03/24/23. Crystal [name], 32 y.o., female with a  past psychiatric history significant for autism, depression, and IDD is being seen at the request of the EDP due to recent aggressive behavior, having thrown a juice at a Engineer, materials, and reported experiencing suicidal ideation. She presented to the emergency department on 11/14/22 after running away from her group home due to getting upset with staff. She reportedly endorsed SI and HI towards the group home's owner. On chart review, patient was released from [facility] to [facility] on 01/24 after staying here for months.   On evaluation patient reports feeling as if the devil influences her thoughts and actions, and she experiences attacks on her mind while sleeping and through other people. She says she has been reading the Bible daily and believes this is related to increased attacks from the devil. She also expresses frustration regarding inconsistent information about her discharge to a new group home. Patient reports she struggles with managing her emotions and occasionally feels that life is not important due to her mental health issues. Patient reports feeling better and has apologized for the incident with the  security guard. She expresses a desire for a positive outcome and is looking forward to going to the group home. She expresses fear that the group home may not accept her due to the incident that occurred the previous day.   Patient appetite reported as satisfactory. She reports frequent nightmares accompanied by headaches upon awakening and reports that a previous medication for nightmares was effective but was discontinued for an unknown reason. Patient is currently on Ativan as needed, which she complains makes her body feel weak and tired, causing her to stumble when walking. Her Depakote level was found to be subtherapeutic, and the dosage has been increased to aid in mood stabilization.   During evaluation [name] is in her room seated in a chair in no acute distress. She is alert, oriented x 4, pleasant, calm and cooperative. Speech is clear and coherent. Objectively there is no evidence of psychosis/mania or delusional thinking.  Patient is able to converse coherently, goal directed thoughts, no distractibility, or pre-occupation. She also denies suicidal/self-harm/homicidal ideation, psychosis, and paranoia. Patient answered questions appropriately.   Past Psychiatric History:  Charted history of adjustment disorder with mixed disturbance of emotions and conduct, autism spectrum disorder, and depression.   Risk to Self:  None Risk to Others:  None Prior Inpatient Therapy:   Prior Outpatient Therapy:    Past Medical History:  Past Medical History:  Diagnosis Date   Cancer (HCC)    skin   Depression    HIV (human immunodeficiency virus infection) (HCC)     Past Surgical History:  Procedure Laterality Date   SKIN SURGERY     Family History: History reviewed. No  pertinent family history. Family Psychiatric  History: None reported Social History:  Social History   Substance and Sexual Activity  Alcohol Use Not Currently     Social History   Substance and Sexual  Activity  Drug Use Not Currently    Social History   Socioeconomic History   Marital status: Single    Spouse name: Not on file   Number of children: Not on file   Years of education: Not on file   Highest education level: Not on file  Occupational History   Not on file  Tobacco Use   Smoking status: Former    Packs/day: 0.50    Years: 12.00    Additional pack years: 0.00    Total pack years: 6.00    Types: Cigarettes    Quit date: 09/2022    Years since quitting: 0.5   Smokeless tobacco: Never  Vaping Use   Vaping Use: Never used  Substance and Sexual Activity   Alcohol use: Not Currently   Drug use: Not Currently   Sexual activity: Not on file  Other Topics Concern   Not on file  Social History Narrative   Not on file   Social Determinants of Health   Financial Resource Strain: Not on file  Food Insecurity: Not on file  Transportation Needs: Not on file  Physical Activity: Not on file  Stress: Not on file  Social Connections: Not on file   Additional Social History:    Allergies:  No Known Allergies  Labs:  Results for orders placed or performed during the hospital encounter of 11/14/22 (from the past 48 hour(s))  Valproic acid level     Status: Abnormal   Collection Time: 03/23/23 11:57 AM  Result Value Ref Range   Valproic Acid Lvl 22 (L) 50.0 - 100.0 ug/mL    Comment: Performed at [facility], [address] [place], [place] [address]    Current Facility-Administered Medications  Medication Dose Route Frequency Provider Last Rate Last Admin   acetaminophen (TYLENOL) tablet 1,000 mg  1,000 mg Oral Q6H PRN Chesley Noon, MD   1,000 mg at 03/22/23 0925   alum & mag hydroxide-simeth (MAALOX/MYLANTA) 200-200-20 MG/5ML suspension 30 mL  30 mL Oral Q6H PRN Sharman Cheek, MD   30 mL at 03/08/23 1839   busPIRone (BUSPAR) tablet 30 mg  30 mg Oral BID Charm Rings, NP   30 mg at 03/24/23 1035   divalproex (DEPAKOTE) DR tablet 250 mg  250 mg  Oral Q12H Riki Gehring H, NP   250 mg at 03/24/23 1037   docusate sodium (COLACE) capsule 100 mg  100 mg Oral Daily PRN Gabriel Cirri F, NP   100 mg at 03/16/23 2125   dolutegravir (TIVICAY) tablet 50 mg  50 mg Oral Daily Mumma, Carollee Herter, MD   50 mg at 03/24/23 1038   And   lamiVUDine (EPIVIR) tablet 300 mg  300 mg Oral Daily Mumma, Carollee Herter, MD   300 mg at 03/24/23 1036   gabapentin (NEURONTIN) capsule 100 mg  100 mg Oral TID Charm Rings, NP   100 mg at 03/24/23 1036   hydrOXYzine (ATARAX) tablet 100 mg  100 mg Oral QHS Charm Rings, NP   100 mg at 03/23/23 2115   ibuprofen (ADVIL) tablet 600 mg  600 mg Oral Q8H PRN Sharman Cheek, MD   600 mg at 03/24/23 1035   loratadine (CLARITIN) tablet 10 mg  10 mg Oral Daily PRN Willy Eddy, MD  10 mg at 03/24/23 1035   LORazepam (ATIVAN) tablet 1 mg  1 mg Oral Q6H PRN Mumma, Carollee Herter, MD   1 mg at 03/24/23 1038   melatonin tablet 5 mg  5 mg Oral QHS Sharen Hones, RPH   5 mg at 03/23/23 2115   ondansetron (ZOFRAN) tablet 4 mg  4 mg Oral Q8H PRN Sharman Cheek, MD   4 mg at 03/20/23 5284   polyethylene glycol (MIRALAX / GLYCOLAX) packet 17 g  17 g Oral Daily PRN Pilar Jarvis, MD   17 g at 02/24/23 0936   risperiDONE (RISPERDAL) tablet 1 mg  1 mg Oral BID Charm Rings, NP   1 mg at 03/24/23 1036   sertraline (ZOLOFT) tablet 200 mg  200 mg Oral Daily Charm Rings, NP   200 mg at 03/24/23 1035   Current Outpatient Medications  Medication Sig Dispense Refill   acetaminophen (TYLENOL) 325 MG tablet Take 650 mg by mouth every 6 (six) hours as needed.     acetaminophen (TYLENOL) 500 MG tablet Take 1 tablet (500 mg total) by mouth every 6 (six) hours as needed. 30 tablet 0   busPIRone (BUSPAR) 30 MG tablet Take 1 tablet (30 mg total) by mouth 2 (two) times daily. 60 tablet 1   Cholecalciferol (VITAMIN D3) 1.25 MG (50000 UT) TABS Take 1 capsule by mouth once a week.     docusate sodium (COLACE) 100 MG capsule Take 1 capsule (100  mg total) by mouth 2 (two) times daily. 60 capsule 1   gabapentin (NEURONTIN) 100 MG capsule Take 1 capsule (100 mg total) by mouth 3 (three) times daily. 90 capsule 0   ibuprofen (ADVIL) 400 MG tablet Take 2 tablets (800 mg total) by mouth every 6 (six) hours as needed for headache. 30 tablet 0   loratadine (CLARITIN) 10 MG tablet Take 1 tablet (10 mg total) by mouth daily as needed for allergies. 30 tablet 0   medroxyPROGESTERone (DEPO-PROVERA) 150 MG/ML injection Inject 150 mg into the muscle every 3 (three) months.     polyethylene glycol (MIRALAX) 17 g packet Take 17 g by mouth daily. 14 each 0   prazosin (MINIPRESS) 2 MG capsule Take 2 mg by mouth at bedtime.     senna (SENOKOT) 8.6 MG TABS tablet Take 1 tablet (8.6 mg total) by mouth daily as needed for mild constipation. 30 tablet 0   sertraline (ZOLOFT) 100 MG tablet Take 2 tablets (200 mg total) by mouth daily. 60 tablet 0   Suvorexant (BELSOMRA) 10 MG TABS Take 1 tablet by mouth at bedtime.     vitamin B-12 (CYANOCOBALAMIN) 1000 MCG tablet Take 1,000 mcg by mouth daily.     ARIPiprazole (ABILIFY) 20 MG tablet Take 1 tablet (20 mg total) by mouth daily. (Patient not taking: Reported on 11/14/2022) 30 tablet 1   atorvastatin (LIPITOR) 20 MG tablet Take 20 mg by mouth daily. (Patient not taking: Reported on 11/14/2022)     dolutegravir-lamiVUDine (DOVATO) 50-300 MG tablet Take 1 tablet by mouth daily. (Patient not taking: Reported on 11/16/2022) 30 tablet 5   hydrOXYzine (VISTARIL) 100 MG capsule Take 1 capsule (100 mg total) by mouth at bedtime. (Patient not taking: Reported on 04/01/2022) 30 capsule 1   linaclotide (LINZESS) 145 MCG CAPS capsule Take 145 mcg by mouth daily before breakfast. (Patient not taking: Reported on 11/14/2022)     melatonin 3 MG TABS tablet Take 2 tablets (6 mg total) by mouth at bedtime. (Patient  not taking: Reported on 04/01/2022) 60 tablet 1   risperiDONE (RISPERDAL) 1 MG tablet Take 1 tablet (1 mg total) by mouth 2  (two) times daily. (Patient not taking: Reported on 11/14/2022) 60 tablet 0    Musculoskeletal: Strength & Muscle Tone: within normal limits Gait & Station: normal Patient leans: N/A            Psychiatric Specialty Exam:  Presentation  General Appearance:  Appropriate for Environment  Eye Contact: Good  Speech: Clear and Coherent  Speech Volume: Normal  Handedness: Right   Mood and Affect  Mood: Euthymic  Affect: Appropriate   Thought Process  Thought Processes: Coherent  Descriptions of Associations:Intact  Orientation:Full (Time, Place and Person)  Thought Content:Logical  History of Schizophrenia/Schizoaffective disorder:No data recorded Duration of Psychotic Symptoms:No data recorded Hallucinations:No data recorded Ideas of Reference:None  Suicidal Thoughts:No data recorded Homicidal Thoughts:No data recorded  Sensorium  Memory: Immediate Fair; Recent Fair; Remote Fair  Judgment: Impaired  Insight: Poor   Executive Functions  Concentration: Good  Attention Span: Good  Recall: Good  Fund of Knowledge: Good  Language: Good   Psychomotor Activity  Psychomotor Activity:No data recorded  Assets  Assets: Communication Skills; Housing; Physical Health; Resilience; Social Support   Sleep  Sleep:No data recorded  Physical Exam: Physical Exam Vitals and nursing note reviewed.  HENT:     Head: Normocephalic.     Nose: Nose normal.  Cardiovascular:     Rate and Rhythm: Normal rate.     Pulses: Normal pulses.  Pulmonary:     Effort: Pulmonary effort is normal.  Musculoskeletal:        General: Normal range of motion.     Cervical back: Normal range of motion.  Neurological:     Mental Status: She is alert and oriented to person, place, and time. Mental status is at baseline.  Psychiatric:        Attention and Perception: Attention normal. She does not perceive auditory or visual hallucinations.         Speech: Speech normal.        Behavior: Behavior is cooperative.        Thought Content: Thought content is not paranoid or delusional. Thought content does not include homicidal or suicidal ideation.        Cognition and Memory: Cognition and memory normal.        Judgment: Judgment is impulsive.     Comments: Euthymic mood. Mood congruent with affect.     Review of Systems  All other systems reviewed and are negative.  Blood pressure 96/60, pulse 98, temperature 98 F (36.7 C), temperature source Oral, resp. rate 18, height 5\' 6"  (1.676 m), weight (S) 100.2 kg, SpO2 95 %. Body mass index is 35.65 kg/m.  Treatment Plan Summary: Plan : 32 y.o. female  Patient reports feeling attacked by the devil through her thoughts and interactions with others. She has been seen by the chaplain and reads the Bible daily.  Continue to utilize resources like the chaplain service as needed for support.  Depakote level (collected yesterday): Subtherapeutic. Adjusted dosage from 125 mg twice a day to 250 mg twice a day to aid in mood stabilization.  Patient has been calm, cooperative, and demonstrated no further negative or aggressive behaviors.  She is cleared psychiatrically. Transition of Care (TOC) team continue to follow for group home placement. Plan reviewed with the ED physician, Dr. Rosalia Hammers.  Disposition:  No evidence of imminent risk to self or  others at present.   Patient does not meet criteria for psychiatric inpatient admission. Supportive therapy provided about ongoing stressors.  Norma Fredrickson, NP 03/24/2023 1:43 PM

## 2023-03-24 NOTE — ED Notes (Signed)
Patient ate 100% of lunch and beverage.  

## 2023-03-24 NOTE — ED Notes (Signed)
Snack given.

## 2023-03-24 NOTE — ED Notes (Signed)
Patient received snack and beverage.  

## 2023-03-24 NOTE — TOC Progression Note (Addendum)
Transition of Care [facility]) - Progression Note    Patient Details  Name: Crystal [name] MRN: [id number] Date of Birth: 05-02-1991  [facility]) CM/SW Contact  Halford Chessman Phone Number: 03/24/2023, 6:54 PM  Clinical Narrative:    CSW waiting to hear back from [employer] to see if funding has been finalized.  CSW [employer] team members requesting an update.  TOC continuing to follow patient's progress.   Expected Discharge Plan: Group Home Barriers to Discharge:  (pending group home acceptance back)  Expected Discharge Plan and Services  New Group Home                                             Social Determinants of Health (SDOH) Interventions SDOH Screenings   Alcohol Screen: Low Risk  (11/11/2020)  Depression (PHQ2-9): Low Risk  (03/17/2022)  Tobacco Use: Medium Risk (11/14/2022)    Readmission Risk Interventions     No data to display

## 2023-03-24 NOTE — ED Notes (Signed)
Pt is showering.  New sheets and blankets were given and pt changed her bed.  Pt also given new scrubs, underwear and socks

## 2023-03-24 NOTE — ED Notes (Signed)
Breakfast given.  

## 2023-03-24 NOTE — ED Notes (Signed)
On 11/14/22 pt was documented to weigh 150lbs, there was a weight of 200lbs documented 2 months ago and today I weighed pt and she weighed 220.9lbs (a 70.9lbs weight gain since February)

## 2023-03-24 NOTE — ED Notes (Signed)
NP talking to patient, she remains calm and cooperative, she has apologized for throwing water yesterday on the guard, she states " I was not thinking right I thought He was my guardian, I could only see her, I will not do that again' Patient also told the guard that she was so sorry for her behavioral. Staff will continue to monitor Patient.

## 2023-03-24 NOTE — ED Notes (Signed)
Lunch tray given. 

## 2023-03-25 DIAGNOSIS — F4325 Adjustment disorder with mixed disturbance of emotions and conduct: Secondary | ICD-10-CM | POA: Diagnosis not present

## 2023-03-25 NOTE — ED Notes (Signed)
Provided evening snack 

## 2023-03-25 NOTE — ED Provider Notes (Signed)
Emergency Medicine Observation Re-evaluation Note  Crystal [name] is a 32 y.o. female, seen on rounds today.  Pt initially presented to the ED for complaints of Back Pain, suicidal ideation, and Homicidal  Physical Exam  BP 110/76 (BP Location: Left Arm)   Pulse 92   Temp 98.1 F (36.7 C) (Oral)   Resp 18   Ht 5\' 6"  (1.676 m)   Wt (S) 100.2 kg Comment: on arrival 11/14/22 pt was documented at 150lbs, 2 months ago at 200lbs  SpO2 96%   BMI 35.65 kg/m  Physical Exam General: nad  ED Course / MDM  EKG:   I have reviewed the labs performed to date as well as medications administered while in observation.    Plan  Current plan is for psych/toc.    Willy Eddy, MD 03/25/23 [phone]

## 2023-03-25 NOTE — ED Notes (Signed)
Patient is calm and cooperative, no signs of distress, she wanted to change rooms due to her room is so warm, nurse did let her move and helped with linen change .

## 2023-03-25 NOTE — ED Notes (Signed)
Patient is vol pending placement 

## 2023-03-25 NOTE — ED Notes (Signed)
Breakfast tray given. °

## 2023-03-25 NOTE — ED Notes (Signed)
Provided dinner tray

## 2023-03-25 NOTE — ED Notes (Signed)
Patient received lunch tray and beverage. 

## 2023-03-25 NOTE — ED Notes (Signed)
Pt given shower supplies and showered  

## 2023-03-26 DIAGNOSIS — F4325 Adjustment disorder with mixed disturbance of emotions and conduct: Secondary | ICD-10-CM | POA: Diagnosis not present

## 2023-03-26 NOTE — TOC Progression Note (Signed)
Transition of Care [facility]) - Progression Note    Patient Details  Name: Crystal [name] MRN: [id number] Date of Birth: 1991/07/29  Transition of Care [facility]) CM/SW Contact  Margarito Liner, LCSW Phone Number: 03/26/2023, 10:06 AM  Clinical Narrative:   Sent secure email to [employer] team to see if there are any updates.  Expected Discharge Plan: Group Home Barriers to Discharge:  (pending group home acceptance back)  Expected Discharge Plan and Services                                               Social Determinants of Health (SDOH) Interventions SDOH Screenings   Alcohol Screen: Low Risk  (11/11/2020)  Depression (PHQ2-9): Low Risk  (03/17/2022)  Tobacco Use: Medium Risk (11/14/2022)    Readmission Risk Interventions     No data to display

## 2023-03-26 NOTE — ED Notes (Signed)
Pt given breakfast tray

## 2023-03-26 NOTE — ED Provider Notes (Signed)
Emergency Medicine Observation Re-evaluation Note  Crystal [name] is a 32 y.o. female, seen on rounds today.  Pt initially presented to the ED for complaints of Back Pain, suicidal ideation, and Homicidal  Currently, the patient is resting comfortably.  Physical Exam  BP 111/79 (BP Location: Left Arm)   Pulse 85   Temp 97.6 F (36.4 C) (Oral)   Resp 20   Ht 5\' 6"  (1.676 m)   Wt (S) 100.2 kg Comment: on arrival 11/14/22 pt was documented at 150lbs, 2 months ago at 200lbs  SpO2 96%   BMI 35.65 kg/m  General: No acute distress Cardiac: Well-perfused extremities Lungs: No respiratory distress Psych: Appropriate mood and affect  ED Course / MDM  EKG:   I have reviewed the labs performed to date as well as medications administered while in observation.  Recent changes in the last 24 hours include none.  Plan  Current plan is for placement.   Merwyn Katos, MD 03/26/23 [phone]

## 2023-03-26 NOTE — ED Notes (Signed)
Pt given snack. 

## 2023-03-26 NOTE — ED Notes (Signed)
Pt taking shower at this time  

## 2023-03-26 NOTE — ED Notes (Signed)
Pt given lunch tray.

## 2023-03-27 DIAGNOSIS — F4325 Adjustment disorder with mixed disturbance of emotions and conduct: Secondary | ICD-10-CM | POA: Diagnosis not present

## 2023-03-27 NOTE — ED Notes (Signed)
Pt has cleaned her room and was given new sheets

## 2023-03-27 NOTE — ED Notes (Signed)
Pt reports nausea is better

## 2023-03-27 NOTE — ED Notes (Signed)
Provided pt with Ginger ale and saltines, explained to pt since she just had one episode of emesis this RN will recommended her not to eat a lot since she just took some medicine to help her with nausea and vomiting

## 2023-03-27 NOTE — ED Notes (Signed)
Patient is vol pending placement 

## 2023-03-27 NOTE — ED Provider Notes (Signed)
Emergency Medicine Observation Re-evaluation Note  Crystal [name] is a 32 y.o. female, seen on rounds today.  Pt initially presented to the ED for complaints of Back Pain, suicidal ideation, and Homicidal  Patient currently sleeping at this time.  In no distress  Physical Exam  BP 117/76 (BP Location: Right Arm)   Pulse 76   Temp 97.7 F (36.5 C) (Oral)   Resp 19   Ht 5\' 6"  (1.676 m)   Wt (S) 100.2 kg Comment: on arrival 11/14/22 pt was documented at 150lbs, 2 months ago at 200lbs  SpO2 99%   BMI 35.65 kg/m  General: No acute distress Lungs: No respiratory distress Psych: Calm, resting ED Course / MDM  EKG:   I have reviewed the labs performed to date as well as medications administered while in observation.  Recent changes in the last 24 hours include none.  Plan  Current plan is for placement.    Sharyn Creamer, MD 03/27/23 [phone]

## 2023-03-27 NOTE — ED Notes (Signed)
Pt has been sleeping during 3am-7am. No bathroom breaks as of 6:37am.

## 2023-03-27 NOTE — ED Notes (Signed)
Pt given dinner tray.

## 2023-03-27 NOTE — ED Notes (Signed)
Pt vomited once, this RN and [place] ED tech cleaned pt's room and changed linen, provide pt with new clothes.

## 2023-03-28 DIAGNOSIS — F4325 Adjustment disorder with mixed disturbance of emotions and conduct: Secondary | ICD-10-CM | POA: Diagnosis not present

## 2023-03-28 NOTE — ED Notes (Signed)
Given  breakfast

## 2023-03-28 NOTE — ED Notes (Addendum)
Pt at door, teary eyed stating she is upset. This RN asked pt to walk to room and talk. Pt stating she thinks her LG doesn't care about her since she hasn't picked up the phone today for this RN's calls. Also stating Tonya told her she would come visit but doesn't remember what day she will do this. Pt reminded of holiday weekend, and asked if there is any other reason for her to think they don't care. Pt responds by saying no other reasons. Pt seems calm at this time. Pt asking to speak with chaplain.

## 2023-03-28 NOTE — ED Notes (Signed)
Pt asking to change her breakfast order stating "every time I eat the eggs, bacon, and toast it makes me throw up." Dietary called and order changed.

## 2023-03-28 NOTE — ED Notes (Signed)
Pt asking to call Tonya again. Pt informed this RN will try again later having just called with no answer not to long ago.

## 2023-03-28 NOTE — ED Provider Notes (Signed)
Emergency Medicine Observation Re-evaluation Note  Crystal [name] is a 31 y.o. female, seen on rounds today.  Pt initially presented to the ED for complaints of Back Pain, suicidal ideation, and Homicidal Currently, the patient is awaiting placement.  Physical Exam  BP 117/73   Pulse 98   Temp 98 F (36.7 C)   Resp 20   Ht 5\' 6"  (1.676 m)   Wt (S) 100.2 kg Comment: on arrival 11/14/22 pt was documented at 150lbs, 2 months ago at 200lbs  SpO2 99%   BMI 35.65 kg/m  Physical Exam General: No acute distress  ED Course / MDM  No labs in past 24 hours  Plan  Current plan is for awaiting placement.    Phineas Semen, MD 03/28/23 1336

## 2023-03-28 NOTE — ED Notes (Signed)
Pt given new beh scrub shirt due to current shirt being ripped

## 2023-03-28 NOTE — ED Notes (Signed)
Pt given dinner tray.

## 2023-03-28 NOTE — ED Notes (Signed)
Pt given lunch tray.

## 2023-03-28 NOTE — ED Notes (Signed)
Pt asking for this RN to call legal guardian, and Archie Patten to get an update. Neither answered the phone. Will try again at a later time. Pt asking to leave VM stating she hasn't heard from [place] and is worried about her. This RN told pt it was a holiday weekend so they may be out of town currently, which seemed to calm pt.

## 2023-03-28 NOTE — ED Notes (Signed)
Pt has been sleeping from 3a-7am with no bathroom breaks.

## 2023-03-28 NOTE — ED Notes (Signed)
Pt requesting anxiety meds  

## 2023-03-28 NOTE — ED Notes (Signed)
Pt asking to try calling Tonya back, this RN called her number, [phone], and she did pick up. Pt wanted to ask if she had any updates from LG, Tonya reports none at this time, tried calling Friday but no answer, said she would try again tomorrow.

## 2023-03-29 DIAGNOSIS — F4325 Adjustment disorder with mixed disturbance of emotions and conduct: Secondary | ICD-10-CM | POA: Diagnosis not present

## 2023-03-29 NOTE — ED Notes (Signed)
Patient received lunch tray and beverage. 

## 2023-03-29 NOTE — ED Notes (Signed)
Meal tray given 

## 2023-03-29 NOTE — ED Notes (Signed)
Breakfast tray provided. 

## 2023-03-29 NOTE — Progress Notes (Addendum)
Chap respond  to Spiritual consult. Meet Pt in room. Pt feeling sad for throwing water upon the Security guard. She sometimes feels overwhelmed by bad thoughts and bad dreams. Mirna Mires brought  claming presence as well as encouraged her to share any bad thoughts with someone who care before acting.   03/29/23 1200  Spiritual Encounters  Type of Visit Initial  Care provided to: Patient  Conversation partners present during encounter Nurse  Referral source Patient request  Reason for visit Urgent spiritual support  OnCall Visit No  Spiritual Framework  Presenting Themes Coping tools;Values and beliefs  Community/Connection Limited  Patient Stress Factors Other (Comment)  Interventions  Spiritual Care Interventions Made Compassionate presence;Decision-making support/facilitation;Prayer  Intervention Outcomes  Outcomes Reduced isolation;Reduced anxiety;Awareness of support  Spiritual Care Plan  Spiritual Care Issues Still Outstanding No further spiritual care needs at this time (see row info)

## 2023-03-29 NOTE — ED Provider Notes (Signed)
Emergency Medicine Observation Re-evaluation Note  Crystal [name] is a 32 y.o. female, seen on rounds today.  Pt initially presented to the ED for complaints of Back Pain, suicidal ideation, and Homicidal Currently, the patient is sitting in day room.  Physical Exam  BP 107/76 (BP Location: Left Arm)   Pulse 93   Temp 98 F (36.7 C) (Oral)   Resp 18   Ht 5\' 6"  (1.676 m)   Wt (S) 100.2 kg Comment: on arrival 11/14/22 pt was documented at 150lbs, 2 months ago at 200lbs  SpO2 97%   BMI 35.65 kg/m  Physical Exam Patient appears well, no acute distress, normal WOB    ED Course / MDM  EKG:   I have reviewed the labs performed to date as well as medications administered while in observation.  Recent changes in the last 24 hours include none.  Plan  Current plan is for dispo per social work.    Chesley Noon, MD 03/29/23 [phone]

## 2023-03-29 NOTE — ED Notes (Signed)
Vol/toc.. 

## 2023-03-29 NOTE — TOC Progression Note (Addendum)
Transition of Care [facility] - Progression Note    Patient Details  Name: Crystal [name] MRN: [id number] Date of Birth: [birth year]  Transition of Care [facility]) CM/SW Contact  Halford Chessman Phone Number: 03/29/2023, 10:05 AM  Clinical Narrative:     CSW spoke to Denton Meek at [facility] to get an update on group home placement.  Per Kennyth Arnold, she is working with the group home owner Gardiner Fanti to get the funding approval finalized.  She is contacting group home owner to discuss when they can accept patient.  2:20pm  CSW received message from [place] at [facility], she said patient may be able to discharge later in the week, she will confirm with group home and patient's legal guardian.  Team meeting to be scheduled for tomorrow to discuss plan.    Do not let patient know about possible discharge later in the week.  Waiting for confirmation from facility.  Expected Discharge Plan: Group Home Barriers to Discharge:  (pending group home acceptance back)  Expected Discharge Plan and Services                                               Social Determinants of Health (SDOH) Interventions SDOH Screenings   Alcohol Screen: Low Risk  (11/11/2020)  Depression (PHQ2-9): Low Risk  (03/17/2022)  Tobacco Use: Medium Risk (11/14/2022)    Readmission Risk Interventions     No data to display

## 2023-03-29 NOTE — ED Notes (Addendum)
Per pt request called Kenney Houseman [phone] as pt would like to know if her legal guardian has been reached.  Kenney Houseman advises that she has called and left messages but has not heard back from legal guardian   [name],[name] Legal Guardian   [phone]  with DSS

## 2023-03-30 DIAGNOSIS — F4325 Adjustment disorder with mixed disturbance of emotions and conduct: Secondary | ICD-10-CM | POA: Diagnosis not present

## 2023-03-30 NOTE — ED Notes (Signed)
Pt currently sitting upright on side of bed shuffling around papers on chair at bedside. Pt appears in no apparent distress at this time.

## 2023-03-30 NOTE — ED Notes (Signed)
Dinner and ginger ale provided.

## 2023-03-30 NOTE — ED Notes (Addendum)
VS obtained. Pt provided blank paper and two crayons due to pts request to color. Pt also provided new scrub pants and socks upon request. New scrub pants were not to pts liking, so pt kept old pants.   Hospital snack and fresh cup of water in foam cup provided to pt. Meal taken to pts assigned room for consumption per BHU policy.

## 2023-03-30 NOTE — ED Notes (Signed)
Pt given snack and water

## 2023-03-30 NOTE — ED Notes (Signed)
Pt from B[facility] 2 alerted ED staff that this pt was laying on floor wedged between her bed and the wall. Upon entering room, pt appeared in no acute distress and stated she was "just sleeping." Pts vs obtained and WDL as documented at this time. Pt denied hitting her head, dizziness, cp, sob, or falling within her room. Pt helped to sit and then stand by EDT and security officer before leading pt to her bed so she could sleep, as requested. TV changed to pts preferred channel. Excess trash removed from pts room and pt denied any further needs.

## 2023-03-30 NOTE — ED Notes (Signed)
Pt from B[facility] 1 came to nurses station to inform ED Staff that this pt had "fallen out of bed" again. Upon EDT, RN, and Security entering room, pt found sitting beside bed with all the blankets on the ground around her. Pt states she was "trying to move the chair closer to the wall" while within the bed. Pt informed the chairs are heavy and should not be moved while the pt is in bed.   RN also informed pt to remain in bed due to the drowsy side effects the medications provided to pt can present. Pt expressed understanding  and stood up without assistance and is walking around her room with a steady gait fixing the blankets and sheets to her bed.

## 2023-03-30 NOTE — ED Notes (Signed)
Pt visualized sitting on floor beside bed in no acute distress utilizing the provided crayons and pieces of paper.

## 2023-03-30 NOTE — ED Provider Notes (Signed)
Emergency Medicine Observation Re-evaluation Note  Crystal [name] is a 32 y.o. female, seen on rounds today.  Pt initially presented to the ED for complaints of Back Pain, suicidal ideation, and Homicidal Currently, the patient is awaiting placement.  Physical Exam  BP 109/80 (BP Location: Right Arm)   Pulse 75   Temp 97.8 F (36.6 C) (Oral)   Resp 15   Ht 5\' 6"  (1.676 m)   Wt (S) 100.2 kg Comment: on arrival 11/14/22 pt was documented at 150lbs, 2 months ago at 200lbs  SpO2 100%   BMI 35.65 kg/m  Physical Exam General: resting comfortably  ED Course / MDM  No labs in past 24 hours  Plan  Current plan is for placement.    Phineas Semen, MD 03/30/23 [phone]

## 2023-03-30 NOTE — ED Notes (Signed)
Breakfast tray provided with apple juice and water

## 2023-03-31 ENCOUNTER — Other Ambulatory Visit: Payer: Self-pay

## 2023-03-31 DIAGNOSIS — F4325 Adjustment disorder with mixed disturbance of emotions and conduct: Secondary | ICD-10-CM | POA: Diagnosis not present

## 2023-03-31 MED ORDER — ACETAMINOPHEN 500 MG PO TABS
1000.0000 mg | ORAL_TABLET | Freq: Four times a day (QID) | ORAL | 0 refills | Status: DC | PRN
  Filled 2023-03-31: qty 30, 4d supply, fill #0

## 2023-03-31 MED ORDER — IBUPROFEN 200 MG PO TABS
600.0000 mg | ORAL_TABLET | Freq: Four times a day (QID) | ORAL | 2 refills | Status: AC | PRN
  Filled 2023-03-31: qty 100, 9d supply, fill #0

## 2023-03-31 MED ORDER — RISPERIDONE 1 MG PO TABS: 1.0000 mg | ORAL_TABLET | Freq: Two times a day (BID) | ORAL | 0 refills | Status: DC

## 2023-03-31 MED ORDER — DOVATO 50-300 MG PO TABS
1.0000 | ORAL_TABLET | Freq: Every day | ORAL | 0 refills | Status: DC
  Filled 2023-03-31: qty 60, 60d supply, fill #0

## 2023-03-31 MED ORDER — IBUPROFEN 200 MG PO TABS
600.0000 mg | ORAL_TABLET | Freq: Four times a day (QID) | ORAL | 2 refills | Status: DC | PRN
  Filled 2023-03-31: qty 100, 9d supply, fill #0

## 2023-03-31 MED ORDER — GABAPENTIN 100 MG PO CAPS
100.0000 mg | ORAL_CAPSULE | Freq: Three times a day (TID) | ORAL | 0 refills | Status: DC
  Filled 2023-03-31: qty 180, 60d supply, fill #0

## 2023-03-31 MED ORDER — SERTRALINE HCL 100 MG PO TABS
200.0000 mg | ORAL_TABLET | Freq: Every day | ORAL | 0 refills | Status: DC
  Filled 2023-03-31: qty 120, 60d supply, fill #0

## 2023-03-31 MED ORDER — ACETAMINOPHEN 500 MG PO TABS
1000.0000 mg | ORAL_TABLET | Freq: Four times a day (QID) | ORAL | 0 refills | Status: AC | PRN
  Filled 2023-03-31: qty 30, 4d supply, fill #0

## 2023-03-31 MED ORDER — GABAPENTIN 100 MG PO CAPS: 100.0000 mg | ORAL_CAPSULE | Freq: Three times a day (TID) | ORAL | 0 refills | Status: AC

## 2023-03-31 MED ORDER — RISPERIDONE 1 MG PO TABS
1.0000 mg | ORAL_TABLET | Freq: Two times a day (BID) | ORAL | 0 refills | Status: DC
  Filled 2023-03-31: qty 120, 60d supply, fill #0

## 2023-03-31 MED ORDER — HYDROXYZINE PAMOATE 50 MG PO CAPS
100.0000 mg | ORAL_CAPSULE | Freq: Every evening | ORAL | 0 refills | Status: DC
  Filled 2023-03-31: qty 60, 30d supply, fill #0

## 2023-03-31 MED ORDER — LORATADINE 10 MG PO TABS
10.0000 mg | ORAL_TABLET | Freq: Every day | ORAL | 0 refills | Status: DC
  Filled 2023-03-31: qty 100, 100d supply, fill #0

## 2023-03-31 MED ORDER — DOCUSATE SODIUM 100 MG PO CAPS
100.0000 mg | ORAL_CAPSULE | Freq: Every day | ORAL | 0 refills | Status: DC | PRN
  Filled 2023-03-31: qty 60, 60d supply, fill #0

## 2023-03-31 MED ORDER — SERTRALINE HCL 100 MG PO TABS: 200.0000 mg | ORAL_TABLET | Freq: Every day | ORAL | 0 refills | Status: DC

## 2023-03-31 MED ORDER — DOCUSATE SODIUM 100 MG PO CAPS: 100.0000 mg | ORAL_CAPSULE | Freq: Every day | ORAL | 0 refills | Status: AC | PRN

## 2023-03-31 MED ORDER — DIVALPROEX SODIUM 250 MG PO DR TAB: 250.0000 mg | DELAYED_RELEASE_TABLET | Freq: Two times a day (BID) | ORAL | 0 refills | Status: AC

## 2023-03-31 MED ORDER — MELATONIN 5 MG PO TABS
5.0000 mg | ORAL_TABLET | Freq: Every day | ORAL | 0 refills | Status: DC
  Filled 2023-03-31 (×2): qty 60, 60d supply, fill #0

## 2023-03-31 MED ORDER — BUSPIRONE HCL 30 MG PO TABS
30.0000 mg | ORAL_TABLET | Freq: Two times a day (BID) | ORAL | 0 refills | Status: DC
  Filled 2023-03-31: qty 120, 60d supply, fill #0

## 2023-03-31 MED ORDER — MELATONIN 5 MG PO TABS: 5.0000 mg | ORAL_TABLET | Freq: Every day | ORAL | 0 refills | Status: AC

## 2023-03-31 MED ORDER — DIVALPROEX SODIUM 250 MG PO DR TAB
250.0000 mg | DELAYED_RELEASE_TABLET | Freq: Two times a day (BID) | ORAL | 0 refills | Status: DC
  Filled 2023-03-31: qty 120, 60d supply, fill #0

## 2023-03-31 MED ORDER — DOVATO 50-300 MG PO TABS: 1.0000 | ORAL_TABLET | Freq: Every day | ORAL | 0 refills | Status: AC

## 2023-03-31 MED ORDER — HYDROXYZINE PAMOATE 50 MG PO CAPS: 100.0000 mg | ORAL_CAPSULE | Freq: Every evening | ORAL | 0 refills | Status: AC

## 2023-03-31 MED ORDER — BUSPIRONE HCL 30 MG PO TABS: 30.0000 mg | ORAL_TABLET | Freq: Two times a day (BID) | ORAL | 0 refills | Status: AC

## 2023-03-31 MED ORDER — LORATADINE 10 MG PO TABS: 10.0000 mg | ORAL_TABLET | Freq: Every day | ORAL | 0 refills | Status: DC

## 2023-03-31 NOTE — ED Notes (Signed)
Pt given lunch tray and beverage 

## 2023-03-31 NOTE — TOC Transition Note (Signed)
Transition of Care [facility]) - CM/SW Discharge Note   Patient Details  Name: Crystal [name] MRN: [id number] Date of Birth: 1991-07-28  Transition of Care [facility]) CM/SW Contact:  Darleene Cleaver, LCSW Phone Number: 03/31/2023, 8:06 PM   Clinical Narrative:    CSW was informed that patient can go to new group home today.  CSW updated attending physician, pharmacy, and bedside nurse.  Patient needed 60 days of medications, CSW coordinated with [facility] outpatient pharmacist [name] to coordinate making sure all of patient's medications are received at the hospital to ensure patient has adequate supply for new group home.  CSW spoke to [place] at [facility], and she said the group home owner Gardiner Fanti will be at hospital around 12:30pm.  Patient's new group home owner arrived, and patient was discharged with Shanita to go to the new group home.  CSW [employer], [place], and patient's legal guardian to inform them that patient has everything she needs and is able to discharge.  TOC signing off.   Final next level of care: Group Home Barriers to Discharge: Barriers Resolved   Patient Goals and CMS Choice CMS Medicare.gov Compare Post Acute Care list provided to:: Legal Guardian Choice offered to / list presented to : [facility] POA / Guardian  Discharge Placement                    Name of family member notified: [name] Donnie Coffin Patient's legal guardian Patient and family notified of of transfer: 03/31/23  Discharge Plan and Services Additional resources added to the After Visit Summary for                                       Social Determinants of Health (SDOH) Interventions SDOH Screenings   Alcohol Screen: Low Risk  (11/11/2020)  Depression (PHQ2-9): Low Risk  (03/17/2022)  Tobacco Use: Medium Risk (11/14/2022)     Readmission Risk Interventions     No data to display

## 2023-03-31 NOTE — TOC Progression Note (Signed)
Transition of Care [facility]) - Progression Note    Patient Details  Name: Crystal [name] MRN: [id number] Date of Birth: 02-Aug-1991  Transition of Care [facility]) CM/SW Contact  Crystal [name], [place] Phone Number: 03/31/2023, 7:51 AM  Clinical Narrative:     Patient has a new group home placement for today.  Group home will need 60 days of medications, and any injectables to be discontinued upon discharge.  Crystal [name] will be picking patient up today.  PER LEGAL GUARDIAN DO NOT TELL PATIENT SHE IS DISCHARGING TODAY AT 12:30PM DUE TO RISK OF INCREASED BEHAVIORS WHEN ANXIOUS.  AVS WILL HAVE TO BE SENT WITH PATIENT AND NEW GROUP HOME OWNER.   Expected Discharge Plan: Group Home Barriers to Discharge:  (pending group home acceptance back)  Expected Discharge Plan and Services                                               Social Determinants of Health (SDOH) Interventions SDOH Screenings   Alcohol Screen: Low Risk  (11/11/2020)  Depression (PHQ2-9): Low Risk  (03/17/2022)  Tobacco Use: Medium Risk (11/14/2022)    Readmission Risk Interventions     No data to display

## 2023-03-31 NOTE — TOC Progression Note (Addendum)
Transition of Care [facility]) - Progression Note    Patient Details  Name: Crystal [name] MRN: [id number] Date of Birth: 15-Sep-1991  Transition of Care [facility]) CM/SW Contact  Darleene Cleaver, [place] Phone Number: 03/30/2023, 5:30pm  Clinical Narrative:     It would be in the best interest of the patient to not be told that she will be picked up tomorrow at 12:30pm, due to history of increased behaviors when she is anxious per legal guardian [name] Donnie Coffin [phone].  CSW received a phone call from [place] at [facility], that the owner of the new group home Gardiner Fanti will be able to pick patient up around 1230pm on Wednesday to take her to the group home.  Patient's legal guardian is aware.  Per request from group home they will need 60 days of patient's medications, CSW to find out if this can be arranged with [facility] from [facility] outpatient pharmacy.  Group home can not accept any injectables if there are any patient can not be discharged on them per group home.    Expected Discharge Plan: Group Home Barriers to Discharge:  (pending group home acceptance back)  Expected Discharge Plan and Services    New group home                                           Social Determinants of Health (SDOH) Interventions SDOH Screenings   Alcohol Screen: Low Risk  (11/11/2020)  Depression (PHQ2-9): Low Risk  (03/17/2022)  Tobacco Use: Medium Risk (11/14/2022)    Readmission Risk Interventions     No data to display

## 2023-03-31 NOTE — NC FL2 (Addendum)
[place] MEDICAID FL2 LEVEL OF CARE FORM     IDENTIFICATION  Patient Name: Crystal [name] Birthdate: 10-16-1990 Sex: female Admission Date (Current Location): 11/14/2022  [place] and [place] Number:  [address] [id number] [facility] Facility and Address:  [facility], [name], [place], [place] [address]      Provider Number: [id number]  Attending Physician Name and Address:  Corena Herter, MD  Relative Name and Phone Number:  [name],[name] Legal Guardian   [phone]    Current Level of Care: Hospital Recommended Level of Care: Family Care Home Prior Approval Number:    Date Approved/Denied:   PASRR Number:    Discharge Plan: Other (Comment) (Family Care Home)    Current Diagnoses: Patient Active Problem List   Diagnosis Date Noted   Autistic spectrum disorder 04/01/2022   Suicidal ideation    Self-inflicted laceration of wrist, initial encounter (HCC) 11/08/2020   HIV (human immunodeficiency virus infection) (HCC)    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    Orientation RESPIRATION BLADDER Height & Weight     Self, Time, Situation, Place  Normal Continent Weight: (S) 220 lb 14.4 oz (100.2 kg) (on arrival 11/14/22 pt was documented at 150lbs, 2 months ago at 200lbs) Height:  5\' 6"  (167.6 cm)  BEHAVIORAL SYMPTOMS/MOOD NEUROLOGICAL BOWEL NUTRITION STATUS      Continent Diet (Regular)  AMBULATORY STATUS COMMUNICATION OF NEEDS Skin   Independent Verbally Normal                       Personal Care Assistance Level of Assistance  Bathing, Dressing, Feeding Bathing Assistance: Independent Feeding assistance: Independent Dressing Assistance: Independent     Functional Limitations Info  Sight, Hearing, Speech Sight Info: Adequate Hearing Info: Adequate Speech Info: Adequate    SPECIAL CARE FACTORS FREQUENCY                       Contractures  Contractures Info: Not present    Additional Factors Info  Code Status, Psychotropic, Allergies Code Status Info: Full Code Allergies Info: No Known Allergies Psychotropic Info: busPIRone (BUSPAR) tablet 30 mg, divalproex (DEPAKOTE) DR tablet 250 mg, risperiDONE (RISPERDAL) tablet 1 mg, sertraline (ZOLOFT) tablet 200 mg         Current Medications (03/31/2023):  This is the current hospital active medication list Current Facility-Administered Medications  Medication Dose Route Frequency Provider Last Rate Last Admin   acetaminophen (TYLENOL) tablet 1,000 mg  1,000 mg Oral Q6H PRN Chesley Noon, MD   1,000 mg at 03/30/23 1937   alum & mag hydroxide-simeth (MAALOX/MYLANTA) 200-200-20 MG/5ML suspension 30 mL  30 mL Oral Q6H PRN Sharman Cheek, MD   30 mL at 03/25/23 2151   busPIRone (BUSPAR) tablet 30 mg  30 mg Oral BID Charm Rings, NP   30 mg at 03/30/23 2109   divalproex (DEPAKOTE) DR tablet 250 mg  250 mg Oral Q12H Bennett, Christal H, NP   250 mg at 03/30/23 2109   docusate sodium (COLACE) capsule 100 mg  100 mg Oral Daily PRN Gabriel Cirri F, NP   100 mg at 03/24/23 1608   dolutegravir (TIVICAY) tablet 50 mg  50 mg Oral Daily Corena Herter, MD   50 mg at 03/30/23 2841   And   lamiVUDine (EPIVIR) tablet 300 mg  300 mg Oral Daily Corena Herter, MD   300 mg at 03/30/23 0925   gabapentin (  NEURONTIN) capsule 100 mg  100 mg Oral TID Charm Rings, NP   100 mg at 03/30/23 2109   hydrOXYzine (ATARAX) tablet 100 mg  100 mg Oral QHS Charm Rings, NP   100 mg at 03/30/23 2109   ibuprofen (ADVIL) tablet 600 mg  600 mg Oral Q8H PRN Sharman Cheek, MD   600 mg at 03/29/23 0940   loratadine (CLARITIN) tablet 10 mg  10 mg Oral Daily PRN Willy Eddy, MD   10 mg at 03/29/23 0939   LORazepam (ATIVAN) tablet 1 mg  1 mg Oral Q6H PRN Corena Herter, MD   1 mg at 03/30/23 1938   melatonin tablet 5 mg  5 mg Oral QHS Sharen Hones, RPH   5 mg at 03/30/23 2109   ondansetron (ZOFRAN)  tablet 4 mg  4 mg Oral Q8H PRN Sharman Cheek, MD   4 mg at 03/27/23 2003   polyethylene glycol (MIRALAX / GLYCOLAX) packet 17 g  17 g Oral Daily PRN Pilar Jarvis, MD   17 g at 02/24/23 0936   risperiDONE (RISPERDAL) tablet 1 mg  1 mg Oral BID Charm Rings, NP   1 mg at 03/30/23 2109   sertraline (ZOLOFT) tablet 200 mg  200 mg Oral Daily Charm Rings, NP   200 mg at 03/30/23 1610   Current Outpatient Medications  Medication Sig Dispense Refill   acetaminophen (TYLENOL) 325 MG tablet Take 650 mg by mouth every 6 (six) hours as needed.     acetaminophen (TYLENOL) 500 MG tablet Take 1 tablet (500 mg total) by mouth every 6 (six) hours as needed. 30 tablet 0   busPIRone (BUSPAR) 30 MG tablet Take 1 tablet (30 mg total) by mouth 2 (two) times daily. 60 tablet 1   Cholecalciferol (VITAMIN D3) 1.25 MG (50000 UT) TABS Take 1 capsule by mouth once a week.     docusate sodium (COLACE) 100 MG capsule Take 1 capsule (100 mg total) by mouth 2 (two) times daily. 60 capsule 1   gabapentin (NEURONTIN) 100 MG capsule Take 1 capsule (100 mg total) by mouth 3 (three) times daily. 90 capsule 0   ibuprofen (ADVIL) 400 MG tablet Take 2 tablets (800 mg total) by mouth every 6 (six) hours as needed for headache. 30 tablet 0   loratadine (CLARITIN) 10 MG tablet Take 1 tablet (10 mg total) by mouth daily as needed for allergies. 30 tablet 0   medroxyPROGESTERone (DEPO-PROVERA) 150 MG/ML injection Inject 150 mg into the muscle every 3 (three) months.     polyethylene glycol (MIRALAX) 17 g packet Take 17 g by mouth daily. 14 each 0   prazosin (MINIPRESS) 2 MG capsule Take 2 mg by mouth at bedtime.     senna (SENOKOT) 8.6 MG TABS tablet Take 1 tablet (8.6 mg total) by mouth daily as needed for mild constipation. 30 tablet 0   sertraline (ZOLOFT) 100 MG tablet Take 2 tablets (200 mg total) by mouth daily. 60 tablet 0   Suvorexant (BELSOMRA) 10 MG TABS Take 1 tablet by mouth at bedtime.     vitamin B-12  (CYANOCOBALAMIN) 1000 MCG tablet Take 1,000 mcg by mouth daily.     ARIPiprazole (ABILIFY) 20 MG tablet Take 1 tablet (20 mg total) by mouth daily. (Patient not taking: Reported on 11/14/2022) 30 tablet 1   atorvastatin (LIPITOR) 20 MG tablet Take 20 mg by mouth daily. (Patient not taking: Reported on 11/14/2022)     dolutegravir-lamiVUDine (DOVATO) 50-300  MG tablet Take 1 tablet by mouth daily. (Patient not taking: Reported on 11/16/2022) 30 tablet 5   hydrOXYzine (VISTARIL) 100 MG capsule Take 1 capsule (100 mg total) by mouth at bedtime. (Patient not taking: Reported on 04/01/2022) 30 capsule 1   linaclotide (LINZESS) 145 MCG CAPS capsule Take 145 mcg by mouth daily before breakfast. (Patient not taking: Reported on 11/14/2022)     melatonin 3 MG TABS tablet Take 2 tablets (6 mg total) by mouth at bedtime. (Patient not taking: Reported on 04/01/2022) 60 tablet 1   risperiDONE (RISPERDAL) 1 MG tablet Take 1 tablet (1 mg total) by mouth 2 (two) times daily. (Patient not taking: Reported on 11/14/2022) 60 tablet 0     Discharge Medications: Please see discharge summary for list of discharge medications.  Relevant Imaging Results:  Relevant Lab Results:   Additional Information SSN [id number]  Darleene Cleaver, LCSW

## 2023-03-31 NOTE — ED Notes (Signed)
Patient c/o anxious feelings. Requested medication. Ativan given as prn ordered.

## 2023-03-31 NOTE — ED Provider Notes (Signed)
Emergency Medicine Observation Re-evaluation Note  Khyla Mccumbers is a 32 y.o. female, seen on rounds today.  Pt initially presented to the ED for complaints of Back Pain, suicidal ideation, and Homicidal  Currently, the patient is is no acute distress. Denies any concerns at this time.  Physical Exam  Blood pressure 129/78, pulse 91, temperature 98.2 F (36.8 C), temperature source Oral, resp. rate 17, height 5\' 6"  (1.676 m), weight (S) 100.2 kg, SpO2 97 %.  Physical Exam: General: No apparent distress Pulm: Normal WOB Neuro: Moving all extremities Psych: Resting comfortably     ED Course / MDM     I have reviewed the labs performed to date as well as medications administered while in observation.  Recent changes in the last 24 hours include: No acute events overnight.  Plan   Current plan: Plan to discharge today to a group home.  Notified by social work.  Sent in a prescription for her home medications for the next 60 days including HAART medications.  This was done with pharmacy consulting. Patient is not under full IVC at this time.    Corena Herter, MD 03/31/23 1225

## 2023-03-31 NOTE — ED Notes (Signed)
Legal guardian notified patient leaving ER now to go to group home.

## 2023-04-06 ENCOUNTER — Other Ambulatory Visit: Payer: Self-pay

## 2023-05-04 ENCOUNTER — Other Ambulatory Visit: Payer: Self-pay

## 2023-05-31 ENCOUNTER — Emergency Department
Admission: EM | Admit: 2023-05-31 | Discharge: 2023-06-01 | Disposition: A | Discharge status: Home / Self Care | Payer: MEDICAID | Attending: Emergency Medicine | Admitting: Emergency Medicine

## 2023-05-31 ENCOUNTER — Other Ambulatory Visit: Payer: Self-pay

## 2023-05-31 DIAGNOSIS — R4589 Other symptoms and signs involving emotional state: Secondary | ICD-10-CM | POA: Diagnosis not present

## 2023-05-31 DIAGNOSIS — F840 Autistic disorder: Secondary | ICD-10-CM | POA: Diagnosis not present

## 2023-05-31 DIAGNOSIS — F4325 Adjustment disorder with mixed disturbance of emotions and conduct: Secondary | ICD-10-CM | POA: Diagnosis present

## 2023-05-31 DIAGNOSIS — F70 Mild intellectual disabilities: Secondary | ICD-10-CM | POA: Diagnosis not present

## 2023-05-31 DIAGNOSIS — Z85828 Personal history of other malignant neoplasm of skin: Secondary | ICD-10-CM | POA: Diagnosis not present

## 2023-05-31 DIAGNOSIS — R45851 Suicidal ideations: Secondary | ICD-10-CM | POA: Diagnosis not present

## 2023-05-31 DIAGNOSIS — S60212A Contusion of left wrist, initial encounter: Secondary | ICD-10-CM | POA: Diagnosis not present

## 2023-05-31 DIAGNOSIS — Z87891 Personal history of nicotine dependence: Secondary | ICD-10-CM | POA: Diagnosis not present

## 2023-05-31 DIAGNOSIS — S6992XA Unspecified injury of left wrist, hand and finger(s), initial encounter: Secondary | ICD-10-CM | POA: Diagnosis present

## 2023-05-31 DIAGNOSIS — Z21 Asymptomatic human immunodeficiency virus [HIV] infection status: Secondary | ICD-10-CM | POA: Insufficient documentation

## 2023-05-31 LAB — COMPREHENSIVE METABOLIC PANEL
ALT: 15 U/L (ref 0–44)
AST: 17 U/L (ref 15–41)
Albumin: 3.9 g/dL (ref 3.5–5.0)
Alkaline Phosphatase: 60 U/L (ref 38–126)
Anion gap: 9 (ref 5–15)
BUN: 11 mg/dL (ref 6–20)
CO2: 22 mmol/L (ref 22–32)
Calcium: 9.3 mg/dL (ref 8.9–10.3)
Chloride: 107 mmol/L (ref 98–111)
Creatinine, Ser: 1.29 mg/dL — ABNORMAL HIGH (ref 0.44–1.00)
GFR, Estimated: 57 mL/min — ABNORMAL LOW (ref >60)
Glucose, Bld: 114 mg/dL — ABNORMAL HIGH (ref 70–99)
Potassium: 3.8 mmol/L (ref 3.5–5.1)
Sodium: 138 mmol/L (ref 135–145)
Total Bilirubin: 0.4 mg/dL (ref 0.3–1.2)
Total Protein: 7.1 g/dL (ref 6.5–8.1)

## 2023-05-31 LAB — URINE DRUG SCREEN, QUALITATIVE (ARMC ONLY)
Amphetamines, Ur Screen: NOT DETECTED
Barbiturates, Ur Screen: NOT DETECTED
Benzodiazepine, Ur Scrn: NOT DETECTED
Cannabinoid 50 Ng, Ur ~~LOC~~: NOT DETECTED
Cocaine Metabolite,Ur ~~LOC~~: NOT DETECTED
MDMA (Ecstasy)Ur Screen: NOT DETECTED
Methadone Scn, Ur: NOT DETECTED
Opiate, Ur Screen: NOT DETECTED
Phencyclidine (PCP) Ur S: NOT DETECTED
Tricyclic, Ur Screen: POSITIVE — AB

## 2023-05-31 LAB — VALPROIC ACID LEVEL: Valproic Acid Lvl: 49 ug/mL — ABNORMAL LOW (ref 50.0–100.0)

## 2023-05-31 LAB — CBC
HCT: 39.3 % (ref 36.0–46.0)
Hemoglobin: 12.9 g/dL (ref 12.0–15.0)
MCH: 31 pg (ref 26.0–34.0)
MCHC: 32.8 g/dL (ref 30.0–36.0)
MCV: 94.5 fL (ref 80.0–100.0)
Platelets: 247 10*3/uL (ref 150–400)
RBC: 4.16 MIL/uL (ref 3.87–5.11)
RDW: 12.7 % (ref 11.5–15.5)
WBC: 6.3 10*3/uL (ref 4.0–10.5)
nRBC: 0 % (ref 0.0–0.2)

## 2023-05-31 LAB — ACETAMINOPHEN LEVEL: Acetaminophen (Tylenol), Serum: <10 ug/mL — ABNORMAL LOW (ref 10–30)

## 2023-05-31 LAB — SALICYLATE LEVEL: Salicylate Lvl: <7 mg/dL — ABNORMAL LOW (ref 7.0–30.0)

## 2023-05-31 LAB — ETHANOL: Alcohol, Ethyl (B): <10 mg/dL (ref <10)

## 2023-05-31 LAB — POC URINE PREG, ED: Preg Test, Ur: NEGATIVE

## 2023-05-31 MED ORDER — MELATONIN 5 MG PO TABS
5.0000 mg | ORAL_TABLET | Freq: Every day | ORAL | Status: DC
  Administered 2023-05-31: 5 mg via ORAL
  Filled 2023-05-31: qty 1

## 2023-05-31 MED ORDER — HYDROXYZINE HCL 25 MG PO TABS
50.0000 mg | ORAL_TABLET | Freq: Once | ORAL | Status: AC
  Administered 2023-05-31: 50 mg via ORAL
  Filled 2023-05-31: qty 2

## 2023-05-31 NOTE — ED Notes (Signed)
Pt received snack and water. 

## 2023-05-31 NOTE — ED Notes (Signed)
According to the [facility] Dept health and human services, pt residences is [facility], [facility] SITE: [address], [place], [place], [address] [phone].   Pt gave RN address.

## 2023-05-31 NOTE — Consult Note (Signed)
Telepsych Consultation   Reason for Consult:  Psych Evaluation  Referring Physician:  Dr. Rosalia Hammers  Location of Patient: [facility] Location of Provider: Other: Remote Office  Patient Identification: Crystal [name] MRN:  [id number] Principal Diagnosis: Adjustment disorder with mixed disturbance of emotions and conduct Diagnosis:  Principal Problem:   Adjustment disorder with mixed disturbance of emotions and conduct Active Problems:   Mild intellectual disability   Suicidal ideation   Total Time spent with patient: 30 minutes  Subjective:  "I wanna be in a locked facility"   HPI:  Tele psych Assessment  Crystal [name], 32 y.o., female patient seen via tele health by TTS and this provider; chart reviewed and consulted with Dr. Rosalia Hammers on 05/31/23.  On evaluation Crystal [name] reports that she has been living at her new group home for a week.  She reports that she doesn't like it because it has "too many rules".  She reports that she'll keep running away if she has to return to the group home. She reads this provider and TTS a letter stating these intentions.  She feels as though her guardian is not listening to her request to be in a locked facilty.  After this provider explains to her in depth how the process works, she then request to go back to her group home in [place].    During evaluation Crystal [name] is sitting in the assessment room appropriately dressed; she is alert/oriented x 4; calm/cooperative; and mood congruent with flat affect.  Patient is speaking in a clear tone at moderate volume, and normal pace; with good eye contact.  Her thought process is coherent and relevant; There is no indication that she is currently responding to internal/external stimuli or experiencing delusional thought content.  Patient endorses denies SI and HI but says that is contingent on going back to the group home.  No evidence of  psychosis, and paranoia.  Patient has answered questions  appropriately.      Recommendations:  Discharge back to the group if they will take her back  Disposition:  Patient is psychiatrically cleared  Dr. Rosalia Hammers informed of above recommendation and disposition  Past Psychiatric History: IDD  Risk to Self:   Risk to Others:   Prior Inpatient Therapy:   Prior Outpatient Therapy:    Past Medical History:  Past Medical History:  Diagnosis Date   Cancer (HCC)    skin   Depression    HIV (human immunodeficiency virus infection) (HCC)     Past Surgical History:  Procedure Laterality Date   SKIN SURGERY     Family History: No family history on file. Family Psychiatric  History: unknown Social History:  Social History   Substance and Sexual Activity  Alcohol Use Not Currently     Social History   Substance and Sexual Activity  Drug Use Not Currently    Social History   Socioeconomic History   Marital status: Single    Spouse name: Not on file   Number of children: Not on file   Years of education: Not on file   Highest education level: Not on file  Occupational History   Not on file  Tobacco Use   Smoking status: Former    Current packs/day: 0.00    Average packs/day: 0.5 packs/day for 12.0 years (6.0 ttl pk-yrs)    Types: Cigarettes    Start date: 09/2010    Quit date: 09/2022    Years since quitting: 0.6   Smokeless tobacco: Never  Vaping Use   Vaping status: Never Used  Substance and Sexual Activity   Alcohol use: Not Currently   Drug use: Not Currently   Sexual activity: Not on file  Other Topics Concern   Not on file  Social History Narrative   Not on file   Social Determinants of Health   Financial Resource Strain: Not on file  Food Insecurity: Not on file  Transportation Needs: Not on file  Physical Activity: Not on file  Stress: Not on file  Social Connections: Not on file   Additional Social History:    Allergies:  No Known Allergies  Labs:  Results for orders placed or performed during  the hospital encounter of 05/31/23 (from the past 48 hour(s))  Comprehensive metabolic panel     Status: Abnormal   Collection Time: 05/31/23  6:06 PM  Result Value Ref Range   Sodium 138 135 - 145 mmol/L   Potassium 3.8 3.5 - 5.1 mmol/L   Chloride 107 98 - 111 mmol/L   CO2 22 22 - 32 mmol/L   Glucose, Bld 114 (H) 70 - 99 mg/dL    Comment: Glucose reference range applies only to samples taken after fasting for at least 8 hours.   BUN 11 6 - 20 mg/dL   Creatinine, Ser 1.61 (H) 0.44 - 1.00 mg/dL   Calcium 9.3 8.9 - 09.6 mg/dL   Total Protein 7.1 6.5 - 8.1 g/dL   Albumin 3.9 3.5 - 5.0 g/dL   AST 17 15 - 41 U/L   ALT 15 0 - 44 U/L   Alkaline Phosphatase 60 38 - 126 U/L   Total Bilirubin 0.4 0.3 - 1.2 mg/dL   GFR, Estimated 57 (L) >60 mL/min    Comment: (NOTE) Calculated using the CKD-EPI Creatinine Equation (2021)    Anion gap 9 5 - 15    Comment: Performed at [facility], [address] [place], [place] [address]  Ethanol     Status: None   Collection Time: 05/31/23  6:06 PM  Result Value Ref Range   Alcohol, Ethyl (B) <10 <10 mg/dL    Comment: (NOTE) Lowest detectable limit for serum alcohol is 10 mg/dL.  For medical purposes only. Performed at [facility], [address] [place], [place] [address]   Salicylate level     Status: Abnormal   Collection Time: 05/31/23  6:06 PM  Result Value Ref Range   Salicylate Lvl <7.0 (L) 7.0 - 30.0 mg/dL    Comment: Performed at [facility], [address] [place], [place] [address]  Acetaminophen level     Status: Abnormal   Collection Time: 05/31/23  6:06 PM  Result Value Ref Range   Acetaminophen (Tylenol), Serum <10 (L) 10 - 30 ug/mL    Comment: (NOTE) Therapeutic concentrations vary significantly. A range of 10-30 ug/mL  may be an effective concentration for many patients. However, some  are best treated at concentrations outside of this range. Acetaminophen concentrations >150 ug/mL at 4 hours  after ingestion  and >50 ug/mL at 12 hours after ingestion are often associated with  toxic reactions.  Performed at [facility], [address] [place], [place] [address]   cbc     Status: None   Collection Time: 05/31/23  6:06 PM  Result Value Ref Range   WBC 6.3 4.0 - 10.5 K/uL   RBC 4.16 3.87 - 5.11 MIL/uL   Hemoglobin 12.9 12.0 - 15.0 g/dL   HCT 30.8 65.7 -  46.0 %   MCV 94.5 80.0 - 100.0 fL   MCH 31.0 26.0 - 34.0 pg   MCHC 32.8 30.0 - 36.0 g/dL   RDW 32.4 40.1 - 02.7 %   Platelets 247 150 - 400 K/uL   nRBC 0.0 0.0 - 0.2 %    Comment: Performed at [facility], [address] [place], [place] [address]  Urine Drug Screen, Qualitative     Status: Abnormal   Collection Time: 05/31/23  6:06 PM  Result Value Ref Range   Tricyclic, Ur Screen POSITIVE (A) NONE DETECTED   Amphetamines, Ur Screen NONE DETECTED NONE DETECTED   MDMA (Ecstasy)Ur Screen NONE DETECTED NONE DETECTED   Cocaine Metabolite,Ur Combs NONE DETECTED NONE DETECTED   Opiate, Ur Screen NONE DETECTED NONE DETECTED   Phencyclidine (PCP) Ur S NONE DETECTED NONE DETECTED   Cannabinoid 50 Ng, Ur [facility] NONE DETECTED NONE DETECTED   Barbiturates, Ur Screen NONE DETECTED NONE DETECTED   Benzodiazepine, Ur Scrn NONE DETECTED NONE DETECTED   Methadone Scn, Ur NONE DETECTED NONE DETECTED    Comment: (NOTE) Tricyclics + metabolites, urine    Cutoff 1000 ng/mL Amphetamines + metabolites, urine  Cutoff 1000 ng/mL MDMA (Ecstasy), urine              Cutoff 500 ng/mL Cocaine Metabolite, urine          Cutoff 300 ng/mL Opiate + metabolites, urine        Cutoff 300 ng/mL Phencyclidine (PCP), urine         Cutoff 25 ng/mL Cannabinoid, urine                 Cutoff 50 ng/mL Barbiturates + metabolites, urine  Cutoff 200 ng/mL Benzodiazepine, urine              Cutoff 200 ng/mL Methadone, urine                   Cutoff 300 ng/mL  The urine drug screen provides only a preliminary, unconfirmed analytical test  result and should not be used for non-medical purposes. Clinical consideration and professional judgment should be applied to any positive drug screen result due to possible interfering substances. A more specific alternate chemical method must be used in order to obtain a confirmed analytical result. Gas chromatography / mass spectrometry (GC/MS) is the preferred confirm atory method. Performed at [facility], [address] [place], [place] [address]   Valproic acid level     Status: Abnormal   Collection Time: 05/31/23  6:06 PM  Result Value Ref Range   Valproic Acid Lvl 49 (L) 50.0 - 100.0 ug/mL    Comment: Performed at [facility], [address] [place], [place] [address]  POC urine preg, ED     Status: None   Collection Time: 05/31/23  6:25 PM  Result Value Ref Range   Preg Test, Ur Negative Negative    Medications:  Current Facility-Administered Medications  Medication Dose Route Frequency Provider Last Rate Last Admin   melatonin tablet 5 mg  5 mg Oral QHS Ray, Danie Binder, MD   5 mg at 05/31/23 2220   Current Outpatient Medications  Medication Sig Dispense Refill   divalproex (DEPAKOTE) 250 MG DR tablet Take 1 tablet (250 mg total) by mouth 2 (two) times daily. 120 tablet 0   gabapentin (NEURONTIN) 100 MG capsule Take 1 capsule (100 mg total) by mouth 3 (three) times daily. 180 capsule 0   linaclotide (LINZESS)  145 MCG CAPS capsule Take 145 mcg by mouth daily before breakfast. (Patient not taking: Reported on 11/14/2022)     loratadine (CLARITIN) 10 MG tablet Take 1 tablet (10 mg total) by mouth daily. 100 tablet 0   medroxyPROGESTERone (DEPO-PROVERA) 150 MG/ML injection Inject 150 mg into the muscle every 3 (three) months.     polyethylene glycol (MIRALAX) 17 g packet Take 17 g by mouth daily. 14 each 0   prazosin (MINIPRESS) 2 MG capsule Take 2 mg by mouth at bedtime.     risperiDONE (RISPERDAL) 1 MG tablet Take 1 tablet (1 mg total) by mouth 2 (two)  times daily. 120 tablet 0   senna (SENOKOT) 8.6 MG TABS tablet Take 1 tablet (8.6 mg total) by mouth daily as needed for mild constipation. 30 tablet 0   sertraline (ZOLOFT) 100 MG tablet Take 2 tablets (200 mg total) by mouth daily. 120 tablet 0   Suvorexant (BELSOMRA) 10 MG TABS Take 1 tablet by mouth at bedtime.     vitamin B-12 (CYANOCOBALAMIN) 1000 MCG tablet Take 1,000 mcg by mouth daily.      Musculoskeletal: Strength & Muscle Tone: within normal limits Gait & Station: normal Patient leans: N/A          Psychiatric Specialty Exam:  Presentation  General Appearance:  Appropriate for Environment  Eye Contact: Fair  Speech: Clear and Coherent  Speech Volume: Normal  Handedness: Right   Mood and Affect  Mood: Depressed; Anxious; Hopeless; Irritable  Affect: Labile   Thought Process  Thought Processes: Coherent  Descriptions of Associations:Intact  Orientation:Full (Time, Place and Person)  Thought Content:WDL  History of Schizophrenia/Schizoaffective disorder:No data recorded Duration of Psychotic Symptoms:No data recorded Hallucinations:Hallucinations: None  Ideas of Reference:None  Suicidal Thoughts:Suicidal Thoughts: Yes, Passive SI Passive Intent and/or Plan: Without Means to Carry Out; Without Intent (Doesnt want to go back to the group home)  Homicidal Thoughts:Homicidal Thoughts: No   Sensorium  Memory: Immediate Fair  Judgment: Poor  Insight: Poor   Executive Functions  Concentration: Poor  Attention Span: Poor  Recall: Poor  Fund of Knowledge: Poor  Language: Poor   Psychomotor Activity  Psychomotor Activity:Psychomotor Activity: Normal   Assets  Assets: Communication Skills; Financial Resources/Insurance; Housing; Social Support   Sleep  Sleep:Sleep: Fair    Physical Exam: Physical Exam Vitals and nursing note reviewed.  HENT:     Head: Normocephalic and atraumatic.     Nose: Nose normal.      Mouth/Throat:     Mouth: Mucous membranes are dry.  Eyes:     Pupils: Pupils are equal, round, and reactive to light.  Pulmonary:     Effort: Pulmonary effort is normal.  Musculoskeletal:        General: Normal range of motion.     Cervical back: Normal range of motion.  Skin:    General: Skin is dry.  Neurological:     Mental Status: She is alert and oriented to person, place, and time.  Psychiatric:        Attention and Perception: Attention and perception normal.        Mood and Affect: Mood is anxious and depressed.        Speech: Speech normal.        Behavior: Behavior is agitated. Behavior is cooperative.        Thought Content: Thought content includes suicidal ideation.        Cognition and Memory: Cognition normal.  Judgment: Judgment is impulsive.    Review of Systems  Psychiatric/Behavioral:  Positive for depression, substance abuse and suicidal ideas. The patient is nervous/anxious.   All other systems reviewed and are negative.  Blood pressure 117/78, pulse 79, temperature 97.7 F (36.5 C), resp. rate 18, height 5\' 2"  (1.575 m), weight 99.8 kg, SpO2 100%. Body mass index is 40.24 kg/m.  Treatment Plan Summary: Patient can return to th grouphomeif they'll have her back  Disposition: No evidence of imminent risk to self or others at present.   Patient does not meet criteria for psychiatric inpatient admission. Discussed crisis plan, support from social network, calling 911, coming to the Emergency Department, and calling Suicide Hotline.  This service was provided via telemedicine using a 2-way, interactive audio and video technology.   Jearld Lesch, NP 05/31/2023 11:22 PM
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
## 2023-05-31 NOTE — ED Notes (Signed)
Vol pending consult 
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
## 2023-05-31 NOTE — ED Notes (Signed)
Pt is dressed out and belongings were placed in a patient belonging bag. The following items are in the bag: 1 pair of black shoes, 1 pair of white socks, 1 pair of blue jeans, 1 blue shirt with colorful stripes, 1 black underwear, 1 white bra.

## 2023-06-01 MED ORDER — DIVALPROEX SODIUM 250 MG PO DR TAB
250.0000 mg | DELAYED_RELEASE_TABLET | Freq: Two times a day (BID) | ORAL | Status: DC
  Administered 2023-06-01: 250 mg via ORAL
  Filled 2023-06-01: qty 1

## 2023-06-01 MED ORDER — RISPERIDONE 1 MG PO TABS
1.0000 mg | ORAL_TABLET | Freq: Two times a day (BID) | ORAL | Status: DC
  Administered 2023-06-01: 1 mg via ORAL
  Filled 2023-06-01: qty 1

## 2023-06-01 MED ORDER — SERTRALINE HCL 50 MG PO TABS
200.0000 mg | ORAL_TABLET | Freq: Every day | ORAL | Status: DC
  Administered 2023-06-01: 200 mg via ORAL
  Filled 2023-06-01: qty 4

## 2023-06-01 MED ORDER — BUSPIRONE HCL 5 MG PO TABS
15.0000 mg | ORAL_TABLET | Freq: Two times a day (BID) | ORAL | Status: DC
  Administered 2023-06-01: 15 mg via ORAL
  Filled 2023-06-01: qty 3

## 2023-06-01 MED ORDER — HYDROXYZINE HCL 25 MG PO TABS: 25.0000 mg | ORAL_TABLET | Freq: Two times a day (BID) | ORAL | Status: DC | PRN

## 2023-06-01 MED ORDER — HYDROXYZINE HCL 25 MG PO TABS: 50.0000 mg | ORAL_TABLET | Freq: Every day | ORAL | Status: DC

## 2023-06-01 NOTE — ED Notes (Signed)
Recommendations:  Discharge back to the group if they will take her back   Disposition:  Patient is psychiatrically cleared

## 2023-06-01 NOTE — TOC CM/SW Note (Signed)
CSW left voicemails for patient's legal guardian and her supervisor. CSW updated Stacy with [employer].  Charlynn Court, CSW [phone]

## 2023-06-01 NOTE — ED Provider Notes (Signed)
Emergency Medicine Observation Re-evaluation Note  Crystal [name] is a 32 y.o. female, seen on rounds today.  Pt initially presented to the ED for complaints of Suicidal Currently, the patient is calm in NAD.  Physical Exam  BP (!) 102/58 (BP Location: Left Arm)   Pulse 76   Temp 97.8 F (36.6 C) (Oral)   Resp 17   Ht 5\' 2"  (1.575 m)   Wt 99.8 kg   LMP  (LMP Unknown)   SpO2 100%   BMI 40.24 kg/m    ED Course / MDM  EKG:   I have reviewed the labs performed to date as well as medications administered while in observation.  Recent changes in the last 24 hours include none.  Plan  Current plan: cleared from psych and TOC perspective, d/c..    Shaune Pollack, MD 06/01/23 1559

## 2023-06-01 NOTE — ED Notes (Addendum)
Crystal [name]--Legal Guardian--Emergency Contact [phone] contacted by RN. Mrs. [name] updated on recommendations for discharge back to group home. Guardian provided number [phone] for Crystal [name] at group home. Crystal [name] called by RN and she states that they have a pending meeting this afternoon about patient and that she will then make decision on whether the patient can return today. Boneta Lucks, EMTP notified and TTS R. Bennett.

## 2023-06-01 NOTE — ED Notes (Addendum)
Called Group home - Ms Burnadette Pop and explained that this patient has been cleared by psych provider as well as a medical provider. Staff also explained that if she is refusing to come to pick up Ms Barks that this would be reported to her guardian as she cannot refuse to pick up patient as she currently resides at this group home.  Ms Burnadette Pop stated that she does not "want stephanie back here, she needs to be in a locked facility."  Staff explained that we cannot act as respite care while she looks for a locked facility, that would be something that her and the guardian would have to work out.  Staff then attempted to contact guardian [name] Donnie Coffin - Message was left, in looking at the notes there have been several messages left with no return calls.   Staff then attempted to contact supervisor [place] DSS supervison Elmer Bales, HIPAA compliant message left. Staff made call to [place] office to speak to someone there r/t pt being ready for p/u.  Adult services reception answered the phone and stated that they were well aware of Ethelmae and that she needs to be picked up.  She stated that they will have their meeting first and then decide if they will pick her up.  Staff tried to again explain that historically they have not picked up the patient in a timely manor and that we need to have a concrete time for this to happen.  [place] APS worker stated that they would "call you back after we decide what we're going to do with her"

## 2023-06-01 NOTE — ED Notes (Signed)
Assisted with shower. Pt dressed in clean blue top scrub, wine colored scrub bottoms. Pt in bed comfortably
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
## 2023-06-01 NOTE — ED Notes (Addendum)
Patient attempted to leave the ER through the ambulance bay entrance. Patient was stopped at the door and redirected to her bed. Security was called to sit with the patient.

## 2023-06-17 ENCOUNTER — Encounter: Payer: Self-pay | Admitting: Infectious Diseases

## 2023-06-17 ENCOUNTER — Other Ambulatory Visit
Admission: RE | Admit: 2023-06-17 | Discharge: 2023-06-17 | Disposition: A | Discharge status: Home / Self Care | Payer: MEDICAID | Source: Ambulatory Visit | Attending: Infectious Diseases | Admitting: Infectious Diseases

## 2023-06-17 ENCOUNTER — Ambulatory Visit: Payer: MEDICAID | Attending: Infectious Diseases | Admitting: Infectious Diseases

## 2023-06-17 VITALS — BP 94/64 | HR 97 | Temp 97.0°F | Ht 66.0 in | Wt 227.0 lb

## 2023-06-17 DIAGNOSIS — R635 Abnormal weight gain: Secondary | ICD-10-CM | POA: Diagnosis not present

## 2023-06-17 DIAGNOSIS — E785 Hyperlipidemia, unspecified: Secondary | ICD-10-CM | POA: Insufficient documentation

## 2023-06-17 DIAGNOSIS — B20 Human immunodeficiency virus [HIV] disease: Secondary | ICD-10-CM | POA: Diagnosis present

## 2023-06-17 DIAGNOSIS — F70 Mild intellectual disabilities: Secondary | ICD-10-CM | POA: Diagnosis present

## 2023-06-17 DIAGNOSIS — F4324 Adjustment disorder with disturbance of conduct: Secondary | ICD-10-CM | POA: Diagnosis present

## 2023-06-17 DIAGNOSIS — Z593 Problems related to living in residential institution: Secondary | ICD-10-CM | POA: Diagnosis not present

## 2023-06-17 DIAGNOSIS — Z79899 Other long term (current) drug therapy: Secondary | ICD-10-CM | POA: Insufficient documentation

## 2023-06-17 DIAGNOSIS — Z21 Asymptomatic human immunodeficiency virus [HIV] infection status: Secondary | ICD-10-CM | POA: Diagnosis present

## 2023-06-17 DIAGNOSIS — Z6836 Body mass index (BMI) 36.0-36.9, adult: Secondary | ICD-10-CM | POA: Insufficient documentation

## 2023-06-17 MED ORDER — DOVATO 50-300 MG PO TABS: 1.0000 | ORAL_TABLET | Freq: Every day | ORAL | 12 refills | Status: DC

## 2023-06-17 NOTE — Progress Notes (Signed)
NAME: Crystal [name]  DOB: [birth year]  MRN: [id number]  Date/Time: 06/17/2023 8:30 AM   Subjective:  Follow-up visit for HIV.  Crystal [name] last was in June 2023. Since then she was in the ED domiciled there for 8 months  ( Nov 2023 until June 2024)as she ran out of the group home . She went to a new group home and ran out of that place and Crystal [name] legal guardian requested the old group home to  take Crystal [name] back She is back at [facility] with them since Aug 13th. She is here with Crystal [name] group home  personnel Crystal [name] She is on Dovato but ran out of prescription on 9/13 She is doing well now  Has gained weight ( 182>>147>>227-- 80 pounds in 1 year Crystal [name] last Vl < 20 and Cd4 > 1000 in 2023 She has no fever, night sweats, cough, sob, pain abdomen, blood in stool. Has good appetite.    Franco Nones FNP-PCP  Following taken from old records Crystal [name] is a 32 y.o. with a history of mild intellectual disability and adjustment disorder with behavioral disturbances  She was diagnosed with HIV when 32 years old she was 32 years old Patient was on Complera until 2021 as prescribed by Dr. Jayme Cloud.  On reviewing the records from Dr. Georgann Housekeeper office she always had undetectable viral load and a high CD4 count.  HIV diagnosed ? Nadir Cd4 unknown VL unknown OI unknown HAARt history unknown Genotype  no resistance ? Past Medical History:  Diagnosis Date   Cancer (HCC)    skin   Depression    HIV (human immunodeficiency virus infection) (HCC)   Adjustment disorder Behavioral disorder Mild  intellectual disability Past Surgical History:  Procedure Laterality Date   SKIN SURGERY    Skin surgery nose for skin cancer Social History   Socioeconomic History   Marital status: Single    Spouse name: Not on file   Number of children: Not on file   Years of education: Not on file   Highest education level: Not on file  Occupational History   Not on file  Tobacco Use   Smoking status: Former    Current packs/day: 0.00     Average packs/day: 0.5 packs/day for 12.0 years (6.0 ttl pk-yrs)    Types: Cigarettes    Start date: 09/2010    Quit date: 09/2022    Years since quitting: 0.7   Smokeless tobacco: Never  Vaping Use   Vaping status: Never Used  Substance and Sexual Activity   Alcohol use: Not Currently   Drug use: Not Currently   Sexual activity: Not on file  Other Topics Concern   Not on file  Social History Narrative   Not on file   Social Determinants of Health   Financial Resource Strain: Not on file  Food Insecurity: Not on file  Transportation Needs: Not on file  Physical Activity: Not on file  Stress: Not on file  Social Connections: Not on file  Intimate Partner Violence: Not on file    No family history on file. No Known Allergies ? Current Outpatient Medications  Medication Sig Dispense Refill   busPIRone (BUSPAR) 30 MG tablet Take 30 mg by mouth 2 (two) times daily.     divalproex (DEPAKOTE) 250 MG DR tablet Take 1 tablet (250 mg total) by mouth 2 (two) times daily. 120 tablet 0   DOVATO 50-300 MG tablet Take 1 tablet by mouth daily.     gabapentin (NEURONTIN) 100 MG capsule Take 1 capsule (  100 mg total) by mouth 3 (three) times daily. 180 capsule 0   hydrOXYzine (VISTARIL) 50 MG capsule Take 50 mg by mouth at bedtime.     linaclotide (LINZESS) 145 MCG CAPS capsule Take 145 mcg by mouth daily before breakfast. (Patient not taking: Reported on 11/14/2022)     loratadine (CLARITIN) 10 MG tablet Take 1 tablet (10 mg total) by mouth daily. 100 tablet 0   medroxyPROGESTERone (DEPO-PROVERA) 150 MG/ML injection Inject 150 mg into the muscle every 3 (three) months.     melatonin 5 MG TABS Take 5 mg by mouth at bedtime.     polyethylene glycol (MIRALAX) 17 g packet Take 17 g by mouth daily. (Patient not taking: Reported on 05/31/2023) 14 each 0   prazosin (MINIPRESS) 2 MG capsule Take 2 mg by mouth at bedtime. (Patient not taking: Reported on 05/31/2023)     risperiDONE (RISPERDAL) 1 MG  tablet Take 1 tablet (1 mg total) by mouth 2 (two) times daily. 120 tablet 0   senna (SENOKOT) 8.6 MG TABS tablet Take 1 tablet (8.6 mg total) by mouth daily as needed for mild constipation. (Patient not taking: Reported on 05/31/2023) 30 tablet 0   sertraline (ZOLOFT) 100 MG tablet Take 2 tablets (200 mg total) by mouth daily. 120 tablet 0   STOOL SOFTENER 100 MG capsule Take 100 mg by mouth daily as needed for mild constipation.     Suvorexant (BELSOMRA) 10 MG TABS Take 1 tablet by mouth at bedtime. (Patient not taking: Reported on 05/31/2023)     vitamin B-12 (CYANOCOBALAMIN) 1000 MCG tablet Take 1,000 mcg by mouth daily. (Patient not taking: Reported on 05/31/2023)     No current facility-administered medications for this visit.    REVIEW OF SYSTEMS:  Const: negative fever, negative chills,weight gain Eyes: negative diplopia or visual changes, negative eye pain ENT: negative coryza, negative sore throat Resp: negative cough, hemoptysis, dyspnea Cards: negative for chest pain, palpitations, lower extremity edema GU: negative for frequency, dysuria and hematuria Skin: negative for rash and pruritus Heme: negative for easy bruising and gum/nose bleeding MS: negative for myalgias, arthralgias, back pain and muscle weakness Neurolo:negative for headaches, dizziness, vertigo, memory problems  Psych: As above Objective:  VITALS:  BP 94/64   Pulse 97   Temp (!) 97 F (36.1 C) (Temporal)   Ht 5\' 6"  (1.676 m)   Wt 227 lb (103 kg)   LMP  (LMP Unknown)   BMI 36.64 kg/m   PHYSICAL EXAM:  General: Alert, cooperative, no distress, appears stated age.  Head: Normocephalic, without obvious abnormality, atraumatic. Eyes: Conjunctivae clear, anicteric sclerae. Pupils are equal Lungs: Clear to auscultation bilaterally. No Wheezing or Rhonchi. No rales. Heart: Regular rate and rhythm, no murmur, rub or gallop. Abdomen: Soft, non-tender,not distended. Bowel sounds normal. No masses Extremities:  Extremities normal, atraumatic, no cyanosis. No edema. No clubbing Skin: No rashes or lesions. Not Jaundiced Lymph: Cervical, supraclavicular normal. Neurologic: Grossly non-focal  Vaccine Date last given comment  Influenza    Hepatitis B    Hepatitis A    Prevnar-PCV-13    Pneumovac-PPSV-23    TdaP 04/30/21   HPV    Shingrix ( zoster vaccine)     ______________________  Labs Lab Result  Date comment  HIV VL Less than 20 12/2021   CD4 928 ( 54%) 01/08/22   Genotype No resistance Geno sure prime on 12/17/2020   [id number]     HIV antibody     RPR Nonreactive 01/08/22  Quantiferon Gold Nonreactive 01/08/22   Hep C ab Nonreactive 12/17/2020 12/17/2020  Hepatitis B-ab,ag,c 16.5 hepatitis B post vaccine antibodies    Hepatitis A-IgM, IgG /T Nonreactive    Lipid     GC/CHL     PAP     HB,PLT,Cr, LFT 13, 226, 1.11      Preventive  Procedure Result  Date comment  colonoscopy     Mammogram     Dental exam     Opthal       Impression/Recommendation ? HIV disease .   On  dovato  ( complera was changed to Dovato to eliminate tenofovir )because of kidney disease in Feb 2023  Increase in creatinine - Now the crcl > 60 and cr is 1.29) Weight gain of 80 pounds since June 2023 Need to follow up with PCP  TSH-was normal in2023   Hyperlipidemia on atorvastatin  On Depo  but has not had it in many months- Crystal [name] care taker will follow with Crystal [name] PCP  Pt is only on risperdal Discussed the management with the patient and the caregiver.  Follow up lab today And appt in 3 months

## 2023-06-18 LAB — T-HELPER CELLS CD4/CD8 %
% CD 4 Pos. Lymph.: 55.6 % (ref 30.8–58.5)
Absolute CD 4 Helper: 1001 /uL (ref 359–1519)
Basophils Absolute: 0 10*3/uL (ref 0.0–0.2)
Basos: 1 %
CD3+CD4+ Cells/CD3+CD8+ Cells Bld: 2.26 (ref 0.92–3.72)
CD3+CD8+ Cells # Bld: 443 /uL (ref 109–897)
CD3+CD8+ Cells NFr Bld: 24.6 % (ref 12.0–35.5)
EOS (ABSOLUTE): 0.1 10*3/uL (ref 0.0–0.4)
Eos: 2 %
Hematocrit: 40.3 % (ref 34.0–46.6)
Hemoglobin: 12.9 g/dL (ref 11.1–15.9)
Immature Grans (Abs): 0 10*3/uL (ref 0.0–0.1)
Immature Granulocytes: 1 %
Lymphocytes Absolute: 1.8 10*3/uL (ref 0.7–3.1)
Lymphs: 30 %
MCH: 30.8 pg (ref 26.6–33.0)
MCHC: 32 g/dL (ref 31.5–35.7)
MCV: 96 fL (ref 79–97)
Monocytes Absolute: 0.7 10*3/uL (ref 0.1–0.9)
Monocytes: 11 %
Neutrophils Absolute: 3.4 10*3/uL (ref 1.4–7.0)
Neutrophils: 55 %
Platelets: 222 10*3/uL (ref 150–450)
RBC: 4.19 x10E6/uL (ref 3.77–5.28)
RDW: 12.9 % (ref 11.7–15.4)
WBC: 6.1 10*3/uL (ref 3.4–10.8)

## 2023-06-18 LAB — HIV-1 RNA QUANT-NO REFLEX-BLD
HIV 1 RNA Quant: <20 {copies}/mL
LOG10 HIV-1 RNA: UNDETERMINED {Log}

## 2023-06-18 LAB — RPR: RPR Ser Ql: NONREACTIVE

## 2023-06-21 LAB — QUANTIFERON-TB GOLD PLUS: QuantiFERON-TB Gold Plus: NEGATIVE

## 2023-06-21 LAB — QUANTIFERON-TB GOLD PLUS (RQFGPL)
QuantiFERON Mitogen Value: >10 [IU]/mL
QuantiFERON Nil Value: 0.01 [IU]/mL
QuantiFERON TB1 Ag Value: 0.01 [IU]/mL
QuantiFERON TB2 Ag Value: 0.01 [IU]/mL

## 2023-06-25 ENCOUNTER — Telehealth: Payer: Self-pay

## 2023-06-25 NOTE — Telephone Encounter (Signed)
Patient's caregiver Maureen Ralphs with [facility] informed that patient's labs are normal.

## 2023-06-25 NOTE — Telephone Encounter (Signed)
-----   Message from Lynn Ito sent at 06/22/2023  7:15 PM EDT ----- Regarding: labs Please let her know that Vl < 20 And cd4 1001. Thx Other labs good as well ----- Message ----- From: Interface, Lab In [place] Sent: 06/18/2023   8:34 AM EDT To: Lynn Ito, MD

## 2023-07-10 ENCOUNTER — Other Ambulatory Visit: Payer: Self-pay

## 2023-07-10 ENCOUNTER — Emergency Department
Admission: EM | Admit: 2023-07-10 | Discharge: 2023-07-10 | Disposition: A | Discharge status: Home / Self Care | Payer: MEDICAID | Attending: Emergency Medicine | Admitting: Emergency Medicine

## 2023-07-10 ENCOUNTER — Encounter: Payer: Self-pay | Admitting: Emergency Medicine

## 2023-07-10 DIAGNOSIS — R4689 Other symptoms and signs involving appearance and behavior: Secondary | ICD-10-CM | POA: Insufficient documentation

## 2023-07-10 LAB — COMPREHENSIVE METABOLIC PANEL
ALT: 12 U/L (ref 0–44)
AST: 12 U/L — ABNORMAL LOW (ref 15–41)
Albumin: 3.6 g/dL (ref 3.5–5.0)
Alkaline Phosphatase: 60 U/L (ref 38–126)
Anion gap: 8 (ref 5–15)
BUN: 11 mg/dL (ref 6–20)
CO2: 21 mmol/L — ABNORMAL LOW (ref 22–32)
Calcium: 8.5 mg/dL — ABNORMAL LOW (ref 8.9–10.3)
Chloride: 109 mmol/L (ref 98–111)
Creatinine, Ser: 1.28 mg/dL — ABNORMAL HIGH (ref 0.44–1.00)
GFR, Estimated: 57 mL/min — ABNORMAL LOW (ref >60)
Glucose, Bld: 112 mg/dL — ABNORMAL HIGH (ref 70–99)
Potassium: 3.9 mmol/L (ref 3.5–5.1)
Sodium: 138 mmol/L (ref 135–145)
Total Bilirubin: 0.5 mg/dL (ref 0.3–1.2)
Total Protein: 7 g/dL (ref 6.5–8.1)

## 2023-07-10 LAB — URINE DRUG SCREEN, QUALITATIVE (ARMC ONLY)
Amphetamines, Ur Screen: NOT DETECTED
Barbiturates, Ur Screen: NOT DETECTED
Benzodiazepine, Ur Scrn: NOT DETECTED
Cannabinoid 50 Ng, Ur ~~LOC~~: NOT DETECTED
Cocaine Metabolite,Ur ~~LOC~~: NOT DETECTED
MDMA (Ecstasy)Ur Screen: NOT DETECTED
Methadone Scn, Ur: NOT DETECTED
Opiate, Ur Screen: NOT DETECTED
Phencyclidine (PCP) Ur S: NOT DETECTED
Tricyclic, Ur Screen: POSITIVE — AB

## 2023-07-10 LAB — CBC
HCT: 38 % (ref 36.0–46.0)
Hemoglobin: 12.2 g/dL (ref 12.0–15.0)
MCH: 30.7 pg (ref 26.0–34.0)
MCHC: 32.1 g/dL (ref 30.0–36.0)
MCV: 95.5 fL (ref 80.0–100.0)
Platelets: 198 10*3/uL (ref 150–400)
RBC: 3.98 MIL/uL (ref 3.87–5.11)
RDW: 13.2 % (ref 11.5–15.5)
WBC: 5.3 10*3/uL (ref 4.0–10.5)
nRBC: 0 % (ref 0.0–0.2)

## 2023-07-10 LAB — POC URINE PREG, ED: Preg Test, Ur: NEGATIVE

## 2023-07-10 LAB — SALICYLATE LEVEL: Salicylate Lvl: <7 mg/dL — ABNORMAL LOW (ref 7.0–30.0)

## 2023-07-10 LAB — ETHANOL: Alcohol, Ethyl (B): <10 mg/dL (ref <10)

## 2023-07-10 LAB — ACETAMINOPHEN LEVEL: Acetaminophen (Tylenol), Serum: 15 ug/mL (ref 10–30)
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
## 2023-07-10 NOTE — ED Notes (Signed)
Staff called [employer] [facility] weekend [employer] @ [phone] to notify them their client was inhouse for services.

## 2023-07-10 NOTE — ED Notes (Signed)
Pt asked staff "am I going to be able to go back to my group home?"  Staff assured Ms Crystal [name] that her group home owner [name] will take her back, staff also notified Ms Crystal [name] that her [facility] owner stated that she was able to get her a 1:1 worker.  [name] smiled and is agreeable to return when possible.

## 2023-07-10 NOTE — ED Notes (Signed)
Group home director called and stated they now have a 1:1 assigned for this patient.  They are ready to pick her up once discharged.  For pick-up call [phone]

## 2023-07-10 NOTE — ED Notes (Signed)
Spoke to [employer] DSS worker, Emmie Niemann.  Obtained permission for Tx and Dch back to group home.
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
## 2023-07-10 NOTE — ED Notes (Signed)
Ms Crystal [name] [phone] @ [facility] notified that pt is ready for discharge.  She stated that she will notify and send 1:1 aide to pick up Ms Crystal [name]

## 2023-07-10 NOTE — ED Notes (Signed)
Pt is A/Ox 4, Crystal [name] declines any SI/HI stated that she does not have any current A/V hallucinations.  Discharge instructions reviewed with group home representative in person as well as with [facility] owner Crystal [name] per telephone conversation, they verbalized understanding.  All Belongings accounted for and returned to PT.  Pt left ambulatory via POV

## 2023-08-26 ENCOUNTER — Emergency Department
Admission: EM | Admit: 2023-08-26 | Discharge: 2023-08-27 | Disposition: A | Discharge status: Other Acute Inpatient Hospital | Payer: MEDICAID | Attending: Emergency Medicine | Admitting: Emergency Medicine

## 2023-08-26 ENCOUNTER — Other Ambulatory Visit: Payer: Self-pay

## 2023-08-26 DIAGNOSIS — S6992XA Unspecified injury of left wrist, hand and finger(s), initial encounter: Secondary | ICD-10-CM | POA: Diagnosis present

## 2023-08-26 DIAGNOSIS — S50812A Abrasion of left forearm, initial encounter: Secondary | ICD-10-CM | POA: Diagnosis not present

## 2023-08-26 DIAGNOSIS — X781XXA Intentional self-harm by knife, initial encounter: Secondary | ICD-10-CM | POA: Insufficient documentation

## 2023-08-26 DIAGNOSIS — S61512A Laceration without foreign body of left wrist, initial encounter: Secondary | ICD-10-CM | POA: Insufficient documentation

## 2023-08-26 DIAGNOSIS — Z87891 Personal history of nicotine dependence: Secondary | ICD-10-CM | POA: Insufficient documentation

## 2023-08-26 DIAGNOSIS — F4325 Adjustment disorder with mixed disturbance of emotions and conduct: Secondary | ICD-10-CM | POA: Diagnosis present

## 2023-08-26 DIAGNOSIS — X789XXA Intentional self-harm by unspecified sharp object, initial encounter: Secondary | ICD-10-CM | POA: Diagnosis present

## 2023-08-26 DIAGNOSIS — F70 Mild intellectual disabilities: Secondary | ICD-10-CM | POA: Diagnosis present

## 2023-08-26 DIAGNOSIS — R4589 Other symptoms and signs involving emotional state: Secondary | ICD-10-CM

## 2023-08-26 DIAGNOSIS — S61519A Laceration without foreign body of unspecified wrist, initial encounter: Secondary | ICD-10-CM | POA: Diagnosis present

## 2023-08-26 DIAGNOSIS — Z21 Asymptomatic human immunodeficiency virus [HIV] infection status: Secondary | ICD-10-CM | POA: Diagnosis present

## 2023-08-26 DIAGNOSIS — Z85828 Personal history of other malignant neoplasm of skin: Secondary | ICD-10-CM | POA: Diagnosis not present

## 2023-08-26 LAB — COMPREHENSIVE METABOLIC PANEL
ALT: 14 U/L (ref 0–44)
AST: 13 U/L — ABNORMAL LOW (ref 15–41)
Albumin: 4 g/dL (ref 3.5–5.0)
Alkaline Phosphatase: 64 U/L (ref 38–126)
Anion gap: 7 (ref 5–15)
BUN: 13 mg/dL (ref 6–20)
CO2: 24 mmol/L (ref 22–32)
Calcium: 8.9 mg/dL (ref 8.9–10.3)
Chloride: 107 mmol/L (ref 98–111)
Creatinine, Ser: 1.34 mg/dL — ABNORMAL HIGH (ref 0.44–1.00)
GFR, Estimated: 54 mL/min — ABNORMAL LOW (ref >60)
Glucose, Bld: 99 mg/dL (ref 70–99)
Potassium: 3.6 mmol/L (ref 3.5–5.1)
Sodium: 138 mmol/L (ref 135–145)
Total Bilirubin: 0.4 mg/dL (ref <1.2)
Total Protein: 7.7 g/dL (ref 6.5–8.1)

## 2023-08-26 LAB — URINE DRUG SCREEN, QUALITATIVE (ARMC ONLY)
Amphetamines, Ur Screen: NOT DETECTED
Barbiturates, Ur Screen: NOT DETECTED
Benzodiazepine, Ur Scrn: NOT DETECTED
Cannabinoid 50 Ng, Ur ~~LOC~~: NOT DETECTED
Cocaine Metabolite,Ur ~~LOC~~: NOT DETECTED
MDMA (Ecstasy)Ur Screen: NOT DETECTED
Methadone Scn, Ur: NOT DETECTED
Opiate, Ur Screen: NOT DETECTED
Phencyclidine (PCP) Ur S: NOT DETECTED
Tricyclic, Ur Screen: NOT DETECTED

## 2023-08-26 LAB — CBC
HCT: 39.9 % (ref 36.0–46.0)
Hemoglobin: 13.1 g/dL (ref 12.0–15.0)
MCH: 31.4 pg (ref 26.0–34.0)
MCHC: 32.8 g/dL (ref 30.0–36.0)
MCV: 95.7 fL (ref 80.0–100.0)
Platelets: 214 10*3/uL (ref 150–400)
RBC: 4.17 MIL/uL (ref 3.87–5.11)
RDW: 13.4 % (ref 11.5–15.5)
WBC: 7.3 10*3/uL (ref 4.0–10.5)
nRBC: 0 % (ref 0.0–0.2)

## 2023-08-26 LAB — SALICYLATE LEVEL: Salicylate Lvl: <7 mg/dL — ABNORMAL LOW (ref 7.0–30.0)

## 2023-08-26 LAB — ACETAMINOPHEN LEVEL: Acetaminophen (Tylenol), Serum: <10 ug/mL — ABNORMAL LOW (ref 10–30)

## 2023-08-26 LAB — ETHANOL: Alcohol, Ethyl (B): <10 mg/dL (ref <10)
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
## 2023-08-26 NOTE — ED Notes (Signed)
TTS with pt for evaluation
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
## 2023-08-26 NOTE — BH Assessment (Signed)
Writer called and left a HIPPA Compliant message with Group Home ([phone]), requesting a return phone call.

## 2023-08-26 NOTE — ED Notes (Signed)
Personal Belongings...   Black Socks Black Shoes Leopard Pants Blue underwear [employer] long sleeve shirt Leopard Bra Black Earrings
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
## 2023-08-26 NOTE — ED Notes (Signed)
VOL  pending  consult 

## 2023-08-26 NOTE — ED Provider Notes (Signed)
Ongoing care and disposition assigned to Dr. Larinda Buttery.  Patient currently pending psychiatry consult   Sharyn Creamer, MD 08/26/23 [phone]

## 2023-08-26 NOTE — ED Notes (Signed)
Pt sitting in hallway recliner eating snack of graham crackers and taking po fluid. She remains calm and cooperative.

## 2023-08-26 NOTE — ED Notes (Signed)
Pt evaluated by Lerry Liner, Psych NP

## 2023-08-27 NOTE — BH Assessment (Signed)
Per [facility]), patient to be referred out of system.  Referral information for Psychiatric Hospitalization faxed to;   [facility] [phone]- [phone]) No appropriate bed available  [facility] ([phone]- [phone]),   [facility] [phone]),  [address] [phone]),    [facility] [phone]- [phone]),   [facility] [phone])  [facility] [phone])

## 2023-08-27 NOTE — ED Notes (Signed)
PATIENT BED AVAILABLE AFTER 12PM ON 08/27/23   Patient has been accepted to [facility].  Accepting physician is Dr. Sherrian Divers.  Call report to [phone].  Representative was Melody.

## 2023-08-27 NOTE — ED Notes (Addendum)
RN attempted to contact,   [name] Guardian Emergency Contact [phone] and [phone].  No answer for either phone number, left VM at both.

## 2023-08-27 NOTE — ED Notes (Addendum)
[phone] ext [phone]. [facility] admissions staff, Georgiann Hahn called about HIV meds. Informed them that [facility] HIV meds have arrived from group home, and can be transported with [employer].

## 2023-08-27 NOTE — BH Assessment (Signed)
PATIENT BED AVAILABLE AFTER 12PM ON 08/27/23  Patient has been accepted to [facility].  Accepting physician is Dr. Sherrian Divers.  Call report to [phone].  Representative was Melody.   ER Staff is aware of it:  [facility] ER Secretary  Dr. Dolores Frame, ER MD  [name] Patient's Nurse     Attempted to contact patient's legal guardian Crystal [name] [phone] but no answer, a HIPAA compliant voicemail was left to return phone call.

## 2023-08-27 NOTE — ED Notes (Addendum)
[facility] states that pt has to come with her HIV meds. RN contacted, [facility] Emergency Contact [phone] To ask if they can bring meds to hospital. No answer, left VM.  RN called Ms. [name] at [phone] who gave most updated phone number for [facility] director [phone] Ms. Juleen China. Ms. Burnadette Pop is not currently at the [facility] and is trying to get in touch with staff to bring the meds here.  [phone]. [facility] admissions staff called about HIV meds. Informed them that I would notify once meds have arrived to ED.

## 2023-08-27 NOTE — ED Provider Notes (Signed)
Emergency Medicine Observation Re-evaluation Note  Crystal [name] is a 32 y.o. female, seen on rounds today.  Pt initially presented to the ED for complaints of Laceration and Suicidal  Currently, the patient is calm, no acute complaints.  Physical Exam  Blood pressure 104/71, pulse 82, temperature 98.1 F (36.7 C), temperature source Oral, resp. rate 18, height 5\' 1"  (1.549 m), weight 90.7 kg, SpO2 99%. Physical Exam General: NAD Lungs: CTAB Psych: not agitated  ED Course / MDM  EKG:    I have reviewed the labs performed to date as well as medications administered while in observation.  Recent changes in the last 24 hours include no acute events overnight.    Plan  Current plan is for psych admission - [facility]. Patient is not under full IVC at this time.   Sharman Cheek, MD 08/27/23 [phone]

## 2023-08-27 NOTE — ED Notes (Signed)
Breakfast given.  

## 2023-08-27 NOTE — BH Assessment (Signed)
Received phone call from patient's guardian ([name] Donnie Coffin), updated her about the patient getting accepted to [facility].

## 2023-08-27 NOTE — ED Notes (Addendum)
Pt taken to [facility] by [employer]. [facility] [employer] driver given 1 bag of belongings containing secured HIV medication as requested by [facility]. Transfer packet handed to [employer] driver. Confirmed pt being taken to [facility]. Pt alert and oriented X4, cooperative, RR even and unlabored, color WNL. Pt in NAD. Pt contracts for safety for drive to [facility]

## 2023-08-27 NOTE — ED Notes (Signed)
EMTALA reviewed by charge RN 

## 2023-08-27 NOTE — ED Notes (Addendum)
RN and Pattricia Boss, RN spoke with [name] [employer] at [phone] to confirm consent for transfer to [facility] via [facility]. Placed on chart.

## 2023-09-09 ENCOUNTER — Encounter: Payer: Self-pay | Admitting: Infectious Diseases

## 2023-09-09 ENCOUNTER — Ambulatory Visit: Payer: MEDICAID | Attending: Infectious Diseases | Admitting: Infectious Diseases

## 2023-09-09 VITALS — BP 108/76 | HR 88 | Temp 97.7°F | Ht 67.0 in | Wt 227.0 lb

## 2023-09-09 DIAGNOSIS — F4320 Adjustment disorder, unspecified: Secondary | ICD-10-CM | POA: Diagnosis present

## 2023-09-09 DIAGNOSIS — Z6835 Body mass index (BMI) 35.0-35.9, adult: Secondary | ICD-10-CM | POA: Diagnosis not present

## 2023-09-09 DIAGNOSIS — Z85828 Personal history of other malignant neoplasm of skin: Secondary | ICD-10-CM | POA: Insufficient documentation

## 2023-09-09 DIAGNOSIS — N189 Chronic kidney disease, unspecified: Secondary | ICD-10-CM | POA: Diagnosis not present

## 2023-09-09 DIAGNOSIS — E785 Hyperlipidemia, unspecified: Secondary | ICD-10-CM | POA: Diagnosis not present

## 2023-09-09 DIAGNOSIS — F919 Conduct disorder, unspecified: Secondary | ICD-10-CM | POA: Diagnosis present

## 2023-09-09 DIAGNOSIS — F70 Mild intellectual disabilities: Secondary | ICD-10-CM | POA: Diagnosis present

## 2023-09-09 DIAGNOSIS — Z87891 Personal history of nicotine dependence: Secondary | ICD-10-CM | POA: Diagnosis not present

## 2023-09-09 DIAGNOSIS — B20 Human immunodeficiency virus [HIV] disease: Secondary | ICD-10-CM | POA: Diagnosis not present

## 2023-09-09 DIAGNOSIS — F4324 Adjustment disorder with disturbance of conduct: Secondary | ICD-10-CM | POA: Insufficient documentation

## 2023-09-09 DIAGNOSIS — Z79899 Other long term (current) drug therapy: Secondary | ICD-10-CM | POA: Insufficient documentation

## 2023-09-09 DIAGNOSIS — R635 Abnormal weight gain: Secondary | ICD-10-CM | POA: Insufficient documentation

## 2023-09-09 NOTE — Progress Notes (Signed)
NAME: Crystal [name]  DOB: 11/17/1990  MRN: [id number]  Date/Time: 09/09/2023 11:06 AM   Subjective:  Follow-up visit for HIV.  Last seen in sept 2024  Recently she was in [facility] behaviroal program 12/6-12/17  Prior to that  she was in [facility]  ED domiciled there for 8 months  ( Nov 2023 until June 2024)as she ran out of the group home . She went to a new group home and ran out of that place and her legal guardian requested the old group home to  take her back She is back at [facility] with them since Aug 13th. She is here with her group home  personnel Maureen Ralphs She is on Dovato She is doing well now  Has gained weight ( 182>>147>>227-- 80 pounds in 1 year Her last Vl < 20 and Cd4 > 1001 in sept 2024 She has no fever, night sweats, cough, sob, pain abdomen, blood in stool. Has good appetite.    Franco Nones FNP-PCP  Following taken from old records Crystal [name] is a 31 y.o. 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 Patient was on Complera until 2021 as prescribed by Dr. Jayme Cloud.  On reviewing the records from Dr. Georgann Housekeeper office she always had undetectable viral load and a high CD4 count.  HIV diagnosed ? Nadir Cd4 unknown VL unknown OI unknown HAARt history unknown Genotype  no resistance ? Past Medical History:  Diagnosis Date   Cancer (HCC)    skin   Depression    HIV (human immunodeficiency virus infection) (HCC)   Adjustment disorder Behavioral disorder Mild  intellectual disability Past Surgical History:  Procedure Laterality Date   SKIN SURGERY    Skin surgery nose for skin cancer Social History   Socioeconomic History   Marital status: Single    Spouse name: Not on file   Number of children: Not on file   Years of education: Not on file   Highest education level: Not on file  Occupational History   Not on file  Tobacco Use   Smoking  status: Former    Current packs/day: 0.00    Average packs/day: 0.5 packs/day for 12.0 years (6.0 ttl pk-yrs)    Types: Cigarettes    Start date: 09/2010    Quit date: 09/2022    Years since quitting: 0.9   Smokeless tobacco: Never  Vaping Use   Vaping status: Never Used  Substance and Sexual Activity   Alcohol use: Not Currently   Drug use: Not Currently   Sexual activity: Not on file  Other Topics Concern   Not on file  Social History Narrative   Not on file   Social Drivers of Health   Financial Resource Strain: Not on file  Food Insecurity: Not on file  Transportation Needs: Not on file  Physical Activity: Not on file  Stress: Not on file  Social Connections: Not on file  Intimate Partner Violence: Not on file    No family history on file. No Known Allergies ? Current Outpatient Medications  Medication Sig Dispense Refill   busPIRone (BUSPAR) 30 MG tablet Take 30 mg by mouth 2 (two) times daily.     DOVATO 50-300 MG tablet Take 1 tablet by mouth daily. 30 tablet 12   hydrOXYzine (VISTARIL) 25 MG capsule Take 25 mg by mouth daily as needed.     melatonin 5 MG TABS Take 5  mg by mouth at bedtime.     senna (SENOKOT) 8.6 MG TABS tablet Take 1 tablet (8.6 mg total) by mouth daily as needed for mild constipation. 30 tablet 0   STOOL SOFTENER 100 MG capsule Take 100 mg by mouth daily as needed for mild constipation.     topiramate (TOPAMAX) 25 MG tablet Take 25 mg by mouth 2 (two) times daily.     divalproex (DEPAKOTE) 250 MG DR tablet Take 1 tablet (250 mg total) by mouth 2 (two) times daily. 120 tablet 0   gabapentin (NEURONTIN) 100 MG capsule Take 1 capsule (100 mg total) by mouth 3 (three) times daily. 180 capsule 0   loratadine (CLARITIN) 10 MG tablet Take 1 tablet (10 mg total) by mouth daily. 100 tablet 0   risperiDONE (RISPERDAL) 1 MG tablet Take 1 tablet (1 mg total) by mouth 2 (two) times daily. 120 tablet 0   No current facility-administered medications for this  visit.    REVIEW OF SYSTEMS:  Const: negative fever, negative chills,weight gain Eyes: negative diplopia or visual changes, negative eye pain ENT: negative coryza, negative sore throat Resp: negative cough, hemoptysis, dyspnea Cards: negative for chest pain, palpitations, lower extremity edema GU: negative for frequency, dysuria and hematuria Skin: negative for rash and pruritus Heme: negative for easy bruising and gum/nose bleeding MS: negative for myalgias, arthralgias, back pain and muscle weakness Neurolo:negative for headaches, dizziness, vertigo, memory problems  Psych: As above Objective:  VITALS:  BP 108/76   Pulse 88   Temp 97.7 F (36.5 C) (Temporal)   Ht 5\' 7"  (1.702 m)   Wt 227 lb (103 kg)   BMI 35.55 kg/m   PHYSICAL EXAM:  General: Alert, cooperative, no distress, appears stated age.  Head: Normocephalic, without obvious abnormality, atraumatic. Eyes: Conjunctivae clear, anicteric sclerae. Pupils are equal Lungs: Clear to auscultation bilaterally. No Wheezing or Rhonchi. No rales. Heart: Regular rate and rhythm, no murmur, rub or gallop. Abdomen: Soft, non-tender,not distended. Bowel sounds normal. No masses Extremities: Extremities normal, atraumatic, no cyanosis. No edema. No clubbing Skin: No rashes or lesions. Not Jaundiced Lymph: Cervical, supraclavicular normal. Neurologic: Grossly non-focal  Vaccine Date last given comment  Influenza    Hepatitis B    Hepatitis A    Prevnar-PCV-13    Pneumovac-PPSV-23    TdaP 04/30/21   HPV    Shingrix ( zoster vaccine)     ______________________  Labs Lab Result  Date comment  HIV VL Less than 20 05/2023   CD4 05/2023    Genotype No resistance Geno sure prime on 12/17/2020   [id number]     HIV antibody     RPR Nonreactive 05/2023   Quantiferon Gold Nonreactive 05/2023   Hep C ab Nonreactive 12/17/2020 12/17/2020  Hepatitis B-ab,ag,c 16.5 hepatitis B post vaccine antibodies    Hepatitis A-IgM, IgG /T Nonreactive     Lipid     GC/CHL     PAP     HB,PLT,Cr, LFT 13, 226, 1.11      Preventive  Procedure Result  Date comment  colonoscopy     Mammogram     Dental exam     Opthal       Impression/Recommendation ? HIV disease .   On  dovato since  Feb 2023 Undetectable Vl and cd4 1000  CKD - Now the crcl > 60 and cr is 1.29)  Weight gain of 80 pounds since June 2023 Need to follow up with PCP  TSH-was normal  [id number]   Hyperlipidemia on atorvastatin  Not on depo any more  but has not had it in many months- her care taker will follow with her PCP  Pt is only on risperdal, topomax buspar, depakote- for behavioral disorder, depression Has intellectual disability Discussed the management with the patient and the caregiver.  Follow up lab today And appt in 3 months

## 2023-09-09 NOTE — Patient Instructions (Signed)
You are here for follow up 1) prescription today No soda Water Reduce or limit processed foods like cookies, pastries, chips Eat more foods like fruits, vegetables, chicken, fish etc

## 2023-09-14 ENCOUNTER — Other Ambulatory Visit: Payer: Self-pay | Admitting: Internal Medicine

## 2023-09-14 MED ORDER — DOLUTEGRAVIR-LAMIVUDINE 50-300 MG PO TABS: 1.0000 | ORAL_TABLET | Freq: Every day | ORAL | 11 refills | Status: DC

## 2023-09-14 NOTE — Progress Notes (Signed)
Asked for dovato refill

## 2023-10-01 ENCOUNTER — Other Ambulatory Visit: Payer: Self-pay

## 2023-10-01 ENCOUNTER — Emergency Department
Admission: EM | Admit: 2023-10-01 | Discharge: 2023-10-05 | Disposition: A | Discharge status: Home / Self Care | Payer: MEDICAID | Attending: Emergency Medicine | Admitting: Emergency Medicine

## 2023-10-01 DIAGNOSIS — R4689 Other symptoms and signs involving appearance and behavior: Secondary | ICD-10-CM | POA: Diagnosis present

## 2023-10-01 DIAGNOSIS — F39 Unspecified mood [affective] disorder: Secondary | ICD-10-CM | POA: Diagnosis not present

## 2023-10-01 DIAGNOSIS — R45851 Suicidal ideations: Secondary | ICD-10-CM | POA: Insufficient documentation

## 2023-10-01 DIAGNOSIS — F70 Mild intellectual disabilities: Secondary | ICD-10-CM | POA: Diagnosis not present

## 2023-10-01 DIAGNOSIS — F69 Unspecified disorder of adult personality and behavior: Secondary | ICD-10-CM | POA: Insufficient documentation

## 2023-10-01 LAB — COMPREHENSIVE METABOLIC PANEL
ALT: 12 U/L (ref 0–44)
AST: 14 U/L — ABNORMAL LOW (ref 15–41)
Albumin: 4.5 g/dL (ref 3.5–5.0)
Alkaline Phosphatase: 67 U/L (ref 38–126)
Anion gap: 13 (ref 5–15)
BUN: 12 mg/dL (ref 6–20)
CO2: 18 mmol/L — ABNORMAL LOW (ref 22–32)
Calcium: 9.6 mg/dL (ref 8.9–10.3)
Chloride: 110 mmol/L (ref 98–111)
Creatinine, Ser: 1.26 mg/dL — ABNORMAL HIGH (ref 0.44–1.00)
GFR, Estimated: 58 mL/min — ABNORMAL LOW (ref >60)
Glucose, Bld: 110 mg/dL — ABNORMAL HIGH (ref 70–99)
Potassium: 4.1 mmol/L (ref 3.5–5.1)
Sodium: 141 mmol/L (ref 135–145)
Total Bilirubin: 0.6 mg/dL (ref 0.0–1.2)
Total Protein: 7.8 g/dL (ref 6.5–8.1)

## 2023-10-01 LAB — URINE DRUG SCREEN, QUALITATIVE (ARMC ONLY)
Amphetamines, Ur Screen: NOT DETECTED
Barbiturates, Ur Screen: NOT DETECTED
Benzodiazepine, Ur Scrn: NOT DETECTED
Cannabinoid 50 Ng, Ur ~~LOC~~: NOT DETECTED
Cocaine Metabolite,Ur ~~LOC~~: NOT DETECTED
MDMA (Ecstasy)Ur Screen: NOT DETECTED
Methadone Scn, Ur: NOT DETECTED
Opiate, Ur Screen: NOT DETECTED
Phencyclidine (PCP) Ur S: NOT DETECTED
Tricyclic, Ur Screen: POSITIVE — AB

## 2023-10-01 LAB — CBC
HCT: 40.1 % (ref 36.0–46.0)
Hemoglobin: 13.3 g/dL (ref 12.0–15.0)
MCH: 32.4 pg (ref 26.0–34.0)
MCHC: 33.2 g/dL (ref 30.0–36.0)
MCV: 97.6 fL (ref 80.0–100.0)
Platelets: 235 10*3/uL (ref 150–400)
RBC: 4.11 MIL/uL (ref 3.87–5.11)
RDW: 13.3 % (ref 11.5–15.5)
WBC: 8.4 10*3/uL (ref 4.0–10.5)
nRBC: 0 % (ref 0.0–0.2)

## 2023-10-01 LAB — ACETAMINOPHEN LEVEL: Acetaminophen (Tylenol), Serum: <10 ug/mL — ABNORMAL LOW (ref 10–30)

## 2023-10-01 LAB — POC URINE PREG, ED: Preg Test, Ur: NEGATIVE

## 2023-10-01 LAB — ETHANOL: Alcohol, Ethyl (B): <10 mg/dL (ref <10)

## 2023-10-01 LAB — SALICYLATE LEVEL: Salicylate Lvl: <7 mg/dL — ABNORMAL LOW (ref 7.0–30.0)
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
## 2023-10-01 NOTE — ED Notes (Signed)
 Attempted to contact legal guardian Demetrius [phone], no answer. HIPAA compliant VM left.
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
## 2023-10-01 NOTE — ED Notes (Addendum)
 Belongings: Black shoes Black shirt Gray pants Green jacket [employer] Black underwear  Black bra

## 2023-10-02 ENCOUNTER — Encounter: Payer: Self-pay | Admitting: Psychiatry

## 2023-10-02 MED ORDER — DIVALPROEX SODIUM 250 MG PO DR TAB
250.0000 mg | DELAYED_RELEASE_TABLET | Freq: Two times a day (BID) | ORAL | Status: DC
  Administered 2023-10-02 – 2023-10-05 (×7): 250 mg via ORAL
  Filled 2023-10-02 (×7): qty 1

## 2023-10-02 MED ORDER — GABAPENTIN 100 MG PO CAPS
100.0000 mg | ORAL_CAPSULE | Freq: Three times a day (TID) | ORAL | Status: DC
  Administered 2023-10-02 – 2023-10-05 (×9): 100 mg via ORAL
  Filled 2023-10-02 (×9): qty 1

## 2023-10-02 MED ORDER — RISPERIDONE 1 MG PO TABS
1.0000 mg | ORAL_TABLET | Freq: Two times a day (BID) | ORAL | Status: DC
  Administered 2023-10-02 – 2023-10-05 (×7): 1 mg via ORAL
  Filled 2023-10-02 (×7): qty 1

## 2023-10-02 MED ORDER — BUSPIRONE HCL 10 MG PO TABS
30.0000 mg | ORAL_TABLET | Freq: Two times a day (BID) | ORAL | Status: DC
  Administered 2023-10-02 – 2023-10-05 (×7): 30 mg via ORAL
  Filled 2023-10-02 (×7): qty 3

## 2023-10-02 MED ORDER — TOPIRAMATE 25 MG PO TABS
25.0000 mg | ORAL_TABLET | Freq: Two times a day (BID) | ORAL | Status: DC
  Administered 2023-10-02 – 2023-10-05 (×7): 25 mg via ORAL
  Filled 2023-10-02 (×7): qty 1

## 2023-10-02 MED ORDER — DOLUTEGRAVIR-LAMIVUDINE 50-300 MG PO TABS
1.0000 | ORAL_TABLET | Freq: Every day | ORAL | Status: DC
  Administered 2023-10-03 – 2023-10-05 (×3): 1 via ORAL
  Filled 2023-10-02 (×3): qty 1
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
## 2023-10-02 NOTE — ED Notes (Signed)
Speaking with telepsych at this time.

## 2023-10-02 NOTE — ED Notes (Signed)
Patient ate 100% of lunch and beverage.  

## 2023-10-02 NOTE — ED Notes (Signed)
 Hospital meal provided, pt tolerated w/o complaints.  Waste discarded appropriately.

## 2023-10-02 NOTE — ED Notes (Addendum)
 Andrez Grime after hours social worker [phone] Guardian/ [name] Donnie Coffin  [phone])

## 2023-10-02 NOTE — ED Notes (Signed)
 Patient has been talking on the phone, no signs of distress.
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
## 2023-10-02 NOTE — ED Notes (Signed)
 Patient is up to the door asking for allergy medication, antianxiety meds, and ensure. Patient did talk with TTS in her room and she remained calm and cooperative.

## 2023-10-02 NOTE — ED Notes (Signed)
Shower supplies given to pt. Pt showering at this time 

## 2023-10-02 NOTE — ED Notes (Signed)
 Patient sitting in the dayroom, no behavioral issues noted, she is calm and interacts with other patients well.

## 2023-10-02 NOTE — ED Notes (Signed)
 This NT provided pt with pm snack.

## 2023-10-02 NOTE — ED Provider Notes (Signed)
 Emergency Medicine Observation Re-evaluation Note  Crystal [name] is a 33 y.o. female, seen on rounds today.  Pt initially presented to the ED for complaints of Psychiatric Evaluation Currently, the patient is resting.  Physical Exam  BP 115/79 (BP Location: Left Arm)   Pulse 88   Temp 97.6 F (36.4 C) (Oral)   Resp 17   Ht 5' 5 (1.651 m)   Wt 86.2 kg   SpO2 98%   BMI 31.62 kg/m  Physical Exam General:  Cardiac:  Lungs:  Psych:   ED Course / MDM  EKG:   I have reviewed the labs performed to date as well as medications administered while in observation.  Recent changes in the last 24 hours include .  Plan  Current plan is for placement.    Claudene Rover, MD 10/02/23 [phone]

## 2023-10-03 MED ORDER — IBUPROFEN 800 MG PO TABS
400.0000 mg | ORAL_TABLET | Freq: Once | ORAL | Status: AC
  Administered 2023-10-03: 400 mg via ORAL
  Filled 2023-10-03: qty 1

## 2023-10-03 NOTE — BH Assessment (Signed)
 Adult MH  Referral information for Psychiatric Hospitalization faxed to:   [facility] [phone]- [phone]),   [address] [phone]),   [facility] [phone] -or- [phone]),   [facility] [phone]),   [facility] [phone] or [phone])   [place] [phone] or [phone]),   [place] [phone]).

## 2023-10-03 NOTE — ED Notes (Signed)
Patient is vol pending admit

## 2023-10-03 NOTE — ED Notes (Signed)
 Pt given pm snack and ginger ale.

## 2023-10-03 NOTE — ED Notes (Signed)
 VOL/Inpt psych admission recommended

## 2023-10-03 NOTE — ED Provider Notes (Signed)
 Emergency Medicine Observation Re-evaluation Note  Crystal [name] is a 33 y.o. female, seen on rounds today.  Pt initially presented to the ED for complaints of Psychiatric Evaluation    Physical Exam  BP 112/74 (BP Location: Right Arm)   Pulse 77   Temp 98.4 F (36.9 C) (Oral)   Resp 17   Ht 5' 5 (1.651 m)   Wt 86.2 kg   SpO2 98%   BMI 31.62 kg/m  Physical Exam General: nad  ED Course / MDM  EKG:   I have reviewed the labs performed to date as well as medications administered while in observation.     Plan  Current plan is for nad.    Lang Dover, MD 10/03/23 [phone]

## 2023-10-04 MED ORDER — IBUPROFEN 800 MG PO TABS
400.0000 mg | ORAL_TABLET | Freq: Once | ORAL | Status: AC
  Administered 2023-10-04: 400 mg via ORAL
  Filled 2023-10-04: qty 1

## 2023-10-04 NOTE — ED Provider Notes (Signed)
 Emergency Medicine Observation Re-evaluation Note  Sueko Dimichele is a 33 y.o. female, seen on rounds today.  Pt initially presented to the ED for complaints of Psychiatric Evaluation Currently, the patient is resting.  Physical Exam  BP 105/69 (BP Location: Right Arm)   Pulse 88   Temp 97.6 F (36.4 C) (Oral)   Resp 18   Ht 5' 5 (1.651 m)   Wt 86.2 kg   SpO2 96%   BMI 31.62 kg/m  Physical Exam General: No acute distress Cardiac: Regular rate Lungs: No respiratory distress Psych: Resting, calm  ED Course / MDM  EKG:   I have reviewed the labs performed to date as well as medications administered while in observation.  Recent changes in the last 24 hours include no acute changes.  Plan  Current plan is for psychiatric placement.    Waymond Lorelle Cummins, MD 10/04/23 [phone]
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
## 2023-10-04 NOTE — ED Notes (Signed)
 VOL ? Pending inpt psych admit

## 2023-10-04 NOTE — ED Notes (Signed)
 Hospital meal provided, pt tolerated w/o complaints.  Waste discarded appropriately.

## 2023-10-04 NOTE — ED Notes (Signed)
 VOL/ Pending inpt psych admint

## 2023-10-04 NOTE — ED Notes (Signed)
 Pt Refused To take shower

## 2023-10-04 NOTE — ED Notes (Signed)
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
## 2023-10-05 DIAGNOSIS — F69 Unspecified disorder of adult personality and behavior: Secondary | ICD-10-CM | POA: Insufficient documentation

## 2023-10-05 MED ORDER — HYDROXYZINE HCL 25 MG PO TABS: 25.0000 mg | ORAL_TABLET | Freq: Three times a day (TID) | ORAL | Status: DC | PRN

## 2023-10-05 MED ORDER — HYDROXYZINE HCL 25 MG PO TABS: 25.0000 mg | ORAL_TABLET | Freq: Three times a day (TID) | ORAL | 0 refills | Status: AC | PRN

## 2023-10-05 NOTE — Discharge Instructions (Signed)
 Take your medications as prescribed.  Return to the ER for new or worsening symptoms.

## 2023-10-05 NOTE — ED Provider Notes (Addendum)
 Emergency Medicine Observation Re-evaluation Note  Crystal [name] is a 33 y.o. female, seen on rounds today.  Pt initially presented to the ED for complaints of Psychiatric Evaluation  Currently, the patient is resting in bed. No reported issues from nursing team.   Physical Exam  BP 103/74 (BP Location: Right Arm)   Pulse 89   Temp 97.7 F (36.5 C) (Oral)   Resp 18   Ht 5' 5 (1.651 m)   Wt 86.2 kg   SpO2 99%   BMI 31.62 kg/m  Physical Exam General: Resting in bed  ED Course / MDM   No labs last 24 hours.  Plan  Current plan is for dispo per psychiatry.  Notified by psychiatry NP Saucier that patient was stable to be discharged back to her group home with plans to be picked up this afternoon.  Did recommend discharge with 25 mg of Atarax  3 times daily as needed which has been ordered.    Levander Slate, MD 10/05/23 1128    Levander Slate, MD 10/05/23 1128

## 2023-10-05 NOTE — ED Notes (Signed)
 vol/consult done/recommended for inpatient psychiatric hospitalization when medically cleared.

## 2023-10-05 NOTE — ED Notes (Signed)
Hospital meal provided. Waste discarded appropriately.   

## 2023-10-05 NOTE — ED Notes (Signed)
 Pt is A/O x3, denies current SI/HI states she has no A/V hallucinations.  Pt is agreeable and stated I'm glad my 1:1 is picking me up, I'm ready to go home, next time I might go inpatient  Staff returned clothing to Ms Bartow.  Pt left ambulatory via POV with staff from group home.  [facility] staff reviewed AVS as well as written/printed RX that was given to the patient care giver to have filled.
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
## 2023-10-05 NOTE — ED Notes (Signed)
 Provided pt with hospital meal. Waste discarded appropriately.

## 2023-10-05 NOTE — ED Notes (Signed)
 Pt calmly sitting in day room. Waiting on transportation back to group home

## 2023-10-23 ENCOUNTER — Other Ambulatory Visit: Payer: Self-pay

## 2023-10-23 ENCOUNTER — Emergency Department
Admission: EM | Admit: 2023-10-23 | Discharge: 2023-10-23 | Disposition: A | Discharge status: Home / Self Care | Payer: MEDICAID | Attending: Emergency Medicine | Admitting: Emergency Medicine

## 2023-10-23 DIAGNOSIS — R4689 Other symptoms and signs involving appearance and behavior: Secondary | ICD-10-CM | POA: Insufficient documentation

## 2023-10-23 DIAGNOSIS — F329 Major depressive disorder, single episode, unspecified: Secondary | ICD-10-CM | POA: Insufficient documentation

## 2023-10-23 DIAGNOSIS — R45851 Suicidal ideations: Secondary | ICD-10-CM | POA: Diagnosis not present

## 2023-10-23 DIAGNOSIS — F69 Unspecified disorder of adult personality and behavior: Secondary | ICD-10-CM | POA: Diagnosis not present

## 2023-10-23 LAB — CBC
HCT: 41.8 % (ref 36.0–46.0)
Hemoglobin: 13.4 g/dL (ref 12.0–15.0)
MCH: 32.1 pg (ref 26.0–34.0)
MCHC: 32.1 g/dL (ref 30.0–36.0)
MCV: 100.2 fL — ABNORMAL HIGH (ref 80.0–100.0)
Platelets: 228 10*3/uL (ref 150–400)
RBC: 4.17 MIL/uL (ref 3.87–5.11)
RDW: 12.8 % (ref 11.5–15.5)
WBC: 7.2 10*3/uL (ref 4.0–10.5)
nRBC: 0 % (ref 0.0–0.2)

## 2023-10-23 LAB — COMPREHENSIVE METABOLIC PANEL
ALT: 12 U/L (ref 0–44)
AST: 11 U/L — ABNORMAL LOW (ref 15–41)
Albumin: 4.3 g/dL (ref 3.5–5.0)
Alkaline Phosphatase: 57 U/L (ref 38–126)
Anion gap: 9 (ref 5–15)
BUN: 12 mg/dL (ref 6–20)
CO2: 24 mmol/L (ref 22–32)
Calcium: 8.8 mg/dL — ABNORMAL LOW (ref 8.9–10.3)
Chloride: 107 mmol/L (ref 98–111)
Creatinine, Ser: 1.27 mg/dL — ABNORMAL HIGH (ref 0.44–1.00)
GFR, Estimated: 57 mL/min — ABNORMAL LOW (ref >60)
Glucose, Bld: 105 mg/dL — ABNORMAL HIGH (ref 70–99)
Potassium: 3.5 mmol/L (ref 3.5–5.1)
Sodium: 140 mmol/L (ref 135–145)
Total Bilirubin: 0.5 mg/dL (ref 0.0–1.2)
Total Protein: 7.9 g/dL (ref 6.5–8.1)

## 2023-10-23 LAB — SALICYLATE LEVEL: Salicylate Lvl: <7 mg/dL — ABNORMAL LOW (ref 7.0–30.0)

## 2023-10-23 LAB — ACETAMINOPHEN LEVEL: Acetaminophen (Tylenol), Serum: <10 ug/mL — ABNORMAL LOW (ref 10–30)

## 2023-10-23 LAB — ETHANOL: Alcohol, Ethyl (B): <10 mg/dL (ref <10)

## 2023-10-23 NOTE — ED Notes (Signed)
Patient transferred to room 1 from triage, she is calm and cooperative at this time, staff will continue to monitor for safety.
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
## 2023-10-23 NOTE — ED Notes (Signed)
VOL, TTS consult complete

## 2023-10-23 NOTE — ED Provider Notes (Signed)
Patient seen and evaluated by psych.  No indication for psychiatric admission or further treatment here in the ER appropriate for outpatient follow-up.   Willy Eddy, MD 10/23/23 [phone]
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
## 2023-10-23 NOTE — ED Notes (Addendum)
Patient at the door asking for the phone , nurse let her know that it would be 1pm before she could use the phone.
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
## 2023-10-23 NOTE — ED Notes (Signed)
 Pt provided with dinner tray.

## 2023-10-23 NOTE — ED Notes (Signed)
pt recieved snack and drink 

## 2023-10-23 NOTE — ED Notes (Addendum)
Pt dressed out by this RN and EDT. Pt belongings:  Star pajama set  [employer]

## 2023-10-23 NOTE — ED Notes (Signed)
Patient is talking with tele-psych at this time, she is calm and cooperative.

## 2023-10-23 NOTE — ED Notes (Signed)
Patient ate lunch and had beverage.
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
## 2023-10-28 ENCOUNTER — Telehealth: Payer: Self-pay | Admitting: Physician Assistant

## 2023-10-28 ENCOUNTER — Encounter: Payer: Self-pay | Admitting: Physician Assistant

## 2023-10-28 ENCOUNTER — Ambulatory Visit (INDEPENDENT_AMBULATORY_CARE_PROVIDER_SITE_OTHER): Payer: MEDICAID | Admitting: Physician Assistant

## 2023-10-28 VITALS — BP 122/78 | HR 81 | Temp 98.5°F | Resp 16 | Ht 67.0 in | Wt 218.6 lb

## 2023-10-28 DIAGNOSIS — R5383 Other fatigue: Secondary | ICD-10-CM

## 2023-10-28 DIAGNOSIS — G4710 Hypersomnia, unspecified: Secondary | ICD-10-CM | POA: Diagnosis not present

## 2023-10-28 DIAGNOSIS — E782 Mixed hyperlipidemia: Secondary | ICD-10-CM | POA: Diagnosis not present

## 2023-10-28 DIAGNOSIS — Z124 Encounter for screening for malignant neoplasm of cervix: Secondary | ICD-10-CM

## 2023-10-28 DIAGNOSIS — Z1329 Encounter for screening for other suspected endocrine disorder: Secondary | ICD-10-CM

## 2023-10-28 DIAGNOSIS — Z309 Encounter for contraceptive management, unspecified: Secondary | ICD-10-CM

## 2023-10-28 DIAGNOSIS — Z7689 Persons encountering health services in other specified circumstances: Secondary | ICD-10-CM

## 2023-10-28 NOTE — Progress Notes (Signed)
 [facility] [address] [place], [place] [address]  Internal MEDICINE  Office Visit Note  Patient Name: Crystal [name]  [id number]  [id number]  Date of Service: 11/07/2023   Complaints/HPI Pt is here for establishment of PCP. Chief Complaint  Patient presents with   New Patient (Initial Visit)   Medication Refill    Needs Depo refilled   Snoring    Possible sleep apnea, SOB upon exertion    HPI Pt is here to establish care, her caregiver is with her -she lives in a group home, she has a hx of mild intellectual disability and has previously run away from her group home -Last PCP left practice so opted to look for new practice -Former smoker -Due for physical and labs -never had a pap,  and would also like to continue depo injections therefore will send GYN referral -122/78 -Followed by Infectious disease provider for HIV -also followed by psychiatry who monitor her depakote  -Long hx of witnessed apneas, snoring, and fatigue. Mainly supine. Interested in a sleep study to evaluate for OSA  EPWORTH SLEEPINESS SCALE:  Scale:  (0)= no chance of dozing; (1)= slight chance of dozing; (2)= moderate chance of dozing; (3)= high chance of dozing  Chance  Situtation    Sitting and reading: 3    Watching TV: 2    Sitting Inactive in public: 0    As a passenger in car: 0      Lying down to rest: 3    Sitting and talking: 0    Sitting quielty after lunch: 3    In a car, stopped in traffic: 0   TOTAL SCORE:   11 out of 24   Current Medication: Outpatient Encounter Medications as of 10/28/2023  Medication Sig   busPIRone  (BUSPAR ) 30 MG tablet Take 30 mg by mouth 2 (two) times daily.   cholecalciferol (VITAMIN D3) 25 MCG (1000 UNIT) tablet Take 2,000 Units by mouth daily.   dolutegravir -lamiVUDine  (DOVATO ) 50-300 MG tablet Take 1 tablet by mouth daily.   hydrOXYzine  (ATARAX ) 25 MG tablet Take 1 tablet (25 mg total) by mouth 3 (three) times daily as needed.    hydrOXYzine  (VISTARIL ) 100 MG capsule Take 100 mg by mouth at bedtime.   hydrOXYzine  (VISTARIL ) 25 MG capsule Take 25 mg by mouth daily as needed.   lamoTRIgine (LAMICTAL) 25 MG tablet Take 25 mg by mouth daily.   LORazepam  (ATIVAN ) 1 MG tablet Take 1 mg by mouth as needed for anxiety.   melatonin 5 MG TABS Take 5 mg by mouth at bedtime.   senna (SENOKOT) 8.6 MG TABS tablet Take 1 tablet (8.6 mg total) by mouth daily as needed for mild constipation.   STOOL SOFTENER 100 MG capsule Take 100 mg by mouth daily as needed for mild constipation.   topiramate  (TOPAMAX ) 25 MG tablet Take 25 mg by mouth 2 (two) times daily.   divalproex  (DEPAKOTE ) 250 MG DR tablet Take 1 tablet (250 mg total) by mouth 2 (two) times daily.   gabapentin  (NEURONTIN ) 100 MG capsule Take 1 capsule (100 mg total) by mouth 3 (three) times daily.   loratadine  (CLARITIN ) 10 MG tablet Take 1 tablet (10 mg total) by mouth daily.   risperiDONE  (RISPERDAL ) 1 MG tablet Take 1 tablet (1 mg total) by mouth 2 (two) times daily.   No facility-administered encounter medications on file as of 10/28/2023.    Surgical History: Past Surgical History:  Procedure Laterality Date   SKIN SURGERY  Medical History: Past Medical History:  Diagnosis Date   Cancer (HCC)    skin   Depression    HIV (human immunodeficiency virus infection) (HCC)     Family History: History reviewed. No pertinent family history.  Social History   Socioeconomic History   Marital status: Single    Spouse name: Not on file   Number of children: Not on file   Years of education: Not on file   Highest education level: Not on file  Occupational History   Not on file  Tobacco Use   Smoking status: Former    Current packs/day: 0.00    Average packs/day: 0.5 packs/day for 12.0 years (6.0 ttl pk-yrs)    Types: Cigarettes    Start date: 09/2010    Quit date: 09/2022    Years since quitting: 1.1   Smokeless tobacco: Never  Vaping Use   Vaping  status: Never Used  Substance and Sexual Activity   Alcohol use: Not Currently   Drug use: Not Currently   Sexual activity: Not Currently  Other Topics Concern   Not on file  Social History Narrative   Not on file   Social Drivers of Health   Financial Resource Strain: Not on file  Food Insecurity: Not on file  Transportation Needs: Not on file  Physical Activity: Not on file  Stress: Not on file  Social Connections: Not on file  Intimate Partner Violence: Not on file     Review of Systems  Constitutional:  Positive for fatigue. Negative for chills and unexpected weight change.  HENT:  Positive for postnasal drip. Negative for congestion, rhinorrhea, sneezing and sore throat.   Eyes:  Negative for redness.  Respiratory:  Positive for apnea. Negative for cough and chest tightness.   Cardiovascular:  Negative for chest pain and palpitations.  Gastrointestinal:  Negative for abdominal pain, constipation, diarrhea, nausea and vomiting.  Genitourinary:  Negative for dysuria and frequency.  Musculoskeletal:  Negative for arthralgias, back pain, joint swelling and neck pain.  Skin:  Negative for rash.  Neurological: Negative.  Negative for tremors and numbness.  Hematological:  Negative for adenopathy. Does not bruise/bleed easily.  Psychiatric/Behavioral:  Positive for sleep disturbance. Negative for behavioral problems (Depression) and suicidal ideas.     Vital Signs: BP 122/78 Comment: 140/68  Pulse 81   Temp 98.5 F (36.9 C)   Resp 16   Ht 5' 7 (1.702 m)   Wt 218 lb 9.6 oz (99.2 kg)   SpO2 97%   BMI 34.24 kg/m    Physical Exam Vitals and nursing note reviewed.  Constitutional:      General: She is not in acute distress.    Appearance: She is well-developed. She is not diaphoretic.  HENT:     Head: Normocephalic and atraumatic.  Neck:     Thyroid : No thyromegaly.     Vascular: No JVD.     Trachea: No tracheal deviation.  Cardiovascular:     Rate and Rhythm:  Normal rate and regular rhythm.     Heart sounds: Normal heart sounds. No murmur heard.    No friction rub. No gallop.  Pulmonary:     Effort: Pulmonary effort is normal.     Breath sounds: Normal breath sounds.  Musculoskeletal:        General: Normal range of motion.  Skin:    General: Skin is warm and dry.  Neurological:     Mental Status: She is alert.  Psychiatric:  Behavior: Behavior normal.        Thought Content: Thought content normal.        Judgment: Judgment normal.       Assessment/Plan: 1. Hypersomnia (Primary) Due to snoring, witness apneas, and fatigue will order Sleep study - [facility] SLEEP STUDY; Future  2. Encounter for contraceptive management, unspecified type Will refer to GYN for interest in depo injections - Ambulatory referral to Obstetrics / Gynecology  3. Encounter for Papanicolaou smear of cervix - Ambulatory referral to Obstetrics / Gynecology  4. Mixed hyperlipidemia - Lipid Panel With LDL/HDL Ratio  5. Thyroid  disorder screen - TSH + free T4  6. Other fatigue - CBC w/Diff/Platelet - Comprehensive metabolic panel - TSH + free T4 - Lipid Panel With LDL/HDL Ratio  7. Encounter to establish care with new doctor Will order labs ans schedule CPE   General Counseling: Loany verbalizes understanding of the findings of todays visit and agrees with plan of treatment. I have discussed any further diagnostic evaluation that may be needed or ordered today. We also reviewed her medications today. she has been encouraged to call the office with any questions or concerns that should arise related to todays visit.    Counseling:    Orders Placed This Encounter  Procedures   CBC w/Diff/Platelet   Comprehensive metabolic panel   TSH + free T4   Lipid Panel With LDL/HDL Ratio   Ambulatory referral to Obstetrics / Gynecology   PSG SLEEP STUDY    No orders of the defined types were placed in this encounter.    This patient was seen by  Tinnie Pro, PA-C in collaboration with Dr. Sigrid Bathe as a part of collaborative care agreement.   Time spent:35 Minutes

## 2023-10-28 NOTE — Telephone Encounter (Signed)
 Awaiting 10/28/23 office notes for SS order-[facility]

## 2023-11-06 LAB — CBC WITH DIFFERENTIAL/PLATELET
Basophils Absolute: 0 10*3/uL (ref 0.0–0.2)
Basos: 1 %
EOS (ABSOLUTE): 0 10*3/uL (ref 0.0–0.4)
Eos: 1 %
Hematocrit: 38.9 % (ref 34.0–46.6)
Hemoglobin: 12.8 g/dL (ref 11.1–15.9)
Immature Grans (Abs): 0 10*3/uL (ref 0.0–0.1)
Immature Granulocytes: 1 %
Lymphocytes Absolute: 1.8 10*3/uL (ref 0.7–3.1)
Lymphs: 30 %
MCH: 32.7 pg (ref 26.6–33.0)
MCHC: 32.9 g/dL (ref 31.5–35.7)
MCV: 100 fL — ABNORMAL HIGH (ref 79–97)
Monocytes Absolute: 0.5 10*3/uL (ref 0.1–0.9)
Monocytes: 9 %
Neutrophils Absolute: 3.6 10*3/uL (ref 1.4–7.0)
Neutrophils: 58 %
Platelets: 199 10*3/uL (ref 150–450)
RBC: 3.91 x10E6/uL (ref 3.77–5.28)
RDW: 12.9 % (ref 11.7–15.4)
WBC: 6 10*3/uL (ref 3.4–10.8)

## 2023-11-06 LAB — COMPREHENSIVE METABOLIC PANEL
ALT: 8 [IU]/L (ref 0–32)
AST: 7 [IU]/L (ref 0–40)
Albumin: 4.2 g/dL (ref 3.9–4.9)
Alkaline Phosphatase: 65 [IU]/L (ref 44–121)
BUN/Creatinine Ratio: 8 — ABNORMAL LOW (ref 9–23)
BUN: 11 mg/dL (ref 6–20)
Bilirubin Total: 0.2 mg/dL (ref 0.0–1.2)
CO2: 20 mmol/L (ref 20–29)
Calcium: 8.7 mg/dL (ref 8.7–10.2)
Chloride: 106 mmol/L (ref 96–106)
Creatinine, Ser: 1.37 mg/dL — ABNORMAL HIGH (ref 0.57–1.00)
Globulin, Total: 2.3 g/dL (ref 1.5–4.5)
Glucose: 89 mg/dL (ref 70–99)
Potassium: 4.1 mmol/L (ref 3.5–5.2)
Sodium: 139 mmol/L (ref 134–144)
Total Protein: 6.5 g/dL (ref 6.0–8.5)
eGFR: 52 mL/min/{1.73_m2} — ABNORMAL LOW (ref >59)

## 2023-11-06 LAB — TSH+FREE T4
Free T4: 1.06 ng/dL (ref 0.82–1.77)
TSH: 2.14 u[IU]/mL (ref 0.450–4.500)

## 2023-11-06 LAB — LIPID PANEL WITH LDL/HDL RATIO
Cholesterol, Total: 179 mg/dL (ref 100–199)
HDL: 38 mg/dL — ABNORMAL LOW (ref >39)
LDL Chol Calc (NIH): 111 mg/dL — ABNORMAL HIGH (ref 0–99)
LDL/HDL Ratio: 2.9 {ratio} (ref 0.0–3.2)
Triglycerides: 168 mg/dL — ABNORMAL HIGH (ref 0–149)
VLDL Cholesterol Cal: 30 mg/dL (ref 5–40)

## 2023-11-09 ENCOUNTER — Telehealth: Payer: Self-pay | Admitting: Physician Assistant

## 2023-11-09 ENCOUNTER — Emergency Department
Admission: EM | Admit: 2023-11-09 | Discharge: 2023-11-09 | Disposition: A | Discharge status: Other Acute Inpatient Hospital | Payer: MEDICAID | Attending: Emergency Medicine | Admitting: Emergency Medicine

## 2023-11-09 ENCOUNTER — Telehealth: Payer: Self-pay

## 2023-11-09 ENCOUNTER — Other Ambulatory Visit: Payer: Self-pay

## 2023-11-09 DIAGNOSIS — R4689 Other symptoms and signs involving appearance and behavior: Secondary | ICD-10-CM

## 2023-11-09 DIAGNOSIS — F918 Other conduct disorders: Secondary | ICD-10-CM | POA: Insufficient documentation

## 2023-11-09 DIAGNOSIS — R45851 Suicidal ideations: Secondary | ICD-10-CM | POA: Diagnosis not present

## 2023-11-09 NOTE — Telephone Encounter (Signed)
 Added B12 lab panel per Lauren.
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
## 2023-11-09 NOTE — ED Notes (Addendum)
 Pt legal guardian returned call, states pt does not need to stay for evaluation, states always runs away and complains of SI. Informed legal guardian that pt would still need to see provider d/t chief complaint. Dr Fanny Bien aware.  Group home number [facility] [phone]

## 2023-11-09 NOTE — ED Notes (Signed)
 Attempted to notify  legal guardian listed of arrival to ED, no answer. HIPAA compliant VM left

## 2023-11-09 NOTE — ED Notes (Signed)
 Pt discharged with group home staff [place]. Legal guardian contacted and aware.

## 2023-11-09 NOTE — Telephone Encounter (Addendum)
 SS order emailed to [facility] w/ [employer]
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
## 2023-11-09 NOTE — ED Notes (Signed)
 No orders for blood work at this time per Dr Fanny Bien
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
## 2023-11-11 LAB — VITAMIN B12: Vitamin B-12: 491 pg/mL (ref 232–1245)

## 2023-11-11 LAB — SPECIMEN STATUS REPORT

## 2023-11-15 ENCOUNTER — Telehealth: Payer: Self-pay | Admitting: Physician Assistant

## 2023-11-15 NOTE — Telephone Encounter (Signed)
 SS appointment 12/10/23 @ [facility]
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
## 2023-11-29 ENCOUNTER — Encounter: Payer: MEDICAID | Admitting: Obstetrics and Gynecology

## 2023-11-29 ENCOUNTER — Telehealth: Payer: Self-pay | Admitting: Physician Assistant

## 2023-11-29 ENCOUNTER — Encounter: Payer: Self-pay | Admitting: Obstetrics and Gynecology

## 2023-11-29 DIAGNOSIS — Z1151 Encounter for screening for human papillomavirus (HPV): Secondary | ICD-10-CM

## 2023-11-29 DIAGNOSIS — Z01419 Encounter for gynecological examination (general) (routine) without abnormal findings: Secondary | ICD-10-CM

## 2023-11-29 DIAGNOSIS — Z124 Encounter for screening for malignant neoplasm of cervix: Secondary | ICD-10-CM

## 2023-11-29 NOTE — Telephone Encounter (Signed)
 Patient was no show for 11/29/23 GYN appointment-[facility]

## 2023-12-10 ENCOUNTER — Encounter (INDEPENDENT_AMBULATORY_CARE_PROVIDER_SITE_OTHER): Payer: MEDICAID | Admitting: Internal Medicine

## 2023-12-10 DIAGNOSIS — G4733 Obstructive sleep apnea (adult) (pediatric): Secondary | ICD-10-CM

## 2023-12-15 NOTE — Procedures (Signed)
 [facility]  Polysomnogram Report Part I                                                               Phone: [phone] Fax: [phone]  Patient Name: Crystal [name], [name] Acquisition Number: [id number]  Date of Birth: 1991/06/18 Acquisition Date: 12/10/2023  Referring Physician: Lynn Ito, PA-C     History: The patient is a 33 year old  who was referred for evaluation of . Medical History: skin cancer, depression, HIV, hypersomnia, fatigue.  Medications: buspirone, cholecalciferol, dolutegravir-lamivudine, hydroxyzine, lamotrigine, lorazepam, melatonin, senna, stool softener, topiramate, divalproex, loratadine, risperidone.  Procedure: This routine overnight polysomnogram was performed on the [facility] 5 using the standard diagnostic protocol. This included 6 channels of EEG, 2 channels of EOG, chin EMG, bilateral anterior tibialis EMG, nasal/oral thermistor, PTAF (nasal pressure transducer), chest and abdominal wall movements, EKG, and pulse oximetry.  Description: The total recording time was 460.5 minutes. The total sleep time was 420.0 minutes. There were a total of 11.0 minutes of wakefulness after sleep onset for a slightly reducedsleep efficiency of 91.2%. The latency to sleep onset was within normal limitsat 29.5 minutes. The R sleep onset latency was slightly prolonged at 129.5 minutes. Sleep parameters, as a percentage of the total sleep time, demonstrated 9.2% of sleep was in N1 sleep, 55.2% N2, 21.5% N3 and 14.0% R sleep. There were a total of 38 arousals for an arousal index of 5.4 arousals per hour of sleep that was normal.  Respiratory monitoring demonstrated   snoring . Only 5 respiratory events were observed. The baseline oxygen saturation during wakefulness was 95%, during NREM sleep averaged 94%, and during REM sleep averaged  94%. The total duration of oxygen < 90% was 0.0 minutes.  Cardiac monitoring-  significant cardiac rhythm irregularities.   Periodic limb  movement monitoring- did not demonstrate periodic limb movements.   Impression: This routine overnight polysomnogram did not demonstrate significant obstructive sleep apnea with only 5 respiratory events observed.   slightly reduced sleep efficiency with a reduced REM percentage. These findings would appear to be due to the increased upper airway resistance.  Recommendations:     CPAP is not indicated in this case Would recommend weight loss in a patient with a BMI of 34.1.    Yevonne Pax, MD, [facility] Diplomate ABMS-Pulmonary, Critical Care and Sleep Medicine  Electronically reviewed and digitally signed [facility] Polysomnogram Report Part II  Phone: [phone] Fax: [phone]  Patient last name [name] Neck Size 15.0 in. Acquisition [id number]  Patient first name Crystal [name] Weight 218.0 lbs. Started 12/10/2023 at 9:41:15 PM  Birth date [birth year] Height 67.0 in. Stopped 12/11/2023 at 5:35:21 AM  Age 33 BMI 34.1 lb/in2 Duration 460.5  Study Type Adult      Candelaria Stagers, RPSGT/John DellaBadia III   Reviewed by: Valentino Hue. Henke, PhD, ABSM, FAASM Sleep Data: Lights Out: 9:50:15 PM Sleep Onset: 10:19:45 PM  Lights On: 5:30:45 AM Sleep Efficiency: 91.2 %  Total Recording Time: 460.5 min Sleep Latency (from Lights Off) 29.5 min  Total Sleep Time (TST): 420.0 min R Latency (from Sleep Onset): 129.5 min  Sleep Period Time: 431.0 min Total number of awakenings: 6  Wake during sleep: 11.0 min Wake After Sleep Onset (WASO): 11.0  min   Sleep Data:         Arousal Summary: Stage  Latency from lights out (min) Latency from sleep onset (min) Duration (min) % Total Sleep Time  Normal values  N 1 29.5 0.0 38.5 9.2 (5%)  N 2 30.0 0.5 232.0 55.2 (50%)  N 3 41.0 11.5 90.5 21.5 (20%)  R 159.0 129.5 59.0 14.0 (25%)   Number Index  Spontaneous 39 5.6  Apneas & Hypopneas 2 0.3  RERAs 2 0.3       (Apneas & Hypopneas & RERAs)  (4) (0.6)  Limb Movement 0 0.0  Snore 0 0.0  TOTAL 43 6.1      Respiratory Data:  CA OA MA Apnea Hypopnea* A+ H RERA Total  Number 2 1 0 3 2 5 2 7   Mean Dur (sec) 13.0 11.0 0.0 12.3 30.5 19.6 13.8 17.9  Max Dur (sec) 15.0 11.0 0.0 15.0 46.0 46.0 15.0 46.0  Total Dur (min) 0.4 0.2 0.0 0.6 1.0 1.6 0.5 2.1  % of TST 0.1 0.0 0.0 0.1 0.2 0.4 0.1 0.5  Index (#/h TST) 0.3 0.1 0.0 0.4 0.3 0.7 0.3 1.0  *Hypopneas scored based on 4% or greater desaturation.  Sleep Stage:        REM NREM TST  AHI 1.0 0.7 0.7  RDI 1.0 1.0 1.0           Body Position Data:  Sleep (min) TST (%) REM (min) NREM (min) CA (#) OA (#) MA (#) HYP (#) AHI (#/h) RERA (#) RDI (#/h) Desat (#)  Supine 159.9 38.07 29.9 130.0 2 1 0 1 1.5 2 2.3 18  Non-Supine 260.10 61.93 29.10 231.00 0.00 0.00 0.00 1.00 0.23 0 0.23 5.00  Left: 104.1 24.79 11.6 92.5 0 0 0 1 0.6 0 0.6 3  Prone: 30.0 7.14 0.0 30.0 0 0 0 0 0.0 0 0.00 0  Right: 126.0 30.00 17.5 108.5 0 0 0 0 0.0 0 0.00 2     Snoring: Total number of snoring episodes  0  Total time with snoring    min (   % of sleep)   Oximetry Distribution:             WK REM NREM TOTAL  Average (%)   95 94 94 94  < 90% 0.0 0.0 0.0 0.0  < 80% 0.0 0.0 0.0 0.0  < 70% 0.0 0.0 0.0 0.0  # of Desaturations* 0 7 16 23   Desat Index (#/hour) 0.0 7.1 2.7 3.3  Desat Max (%) 0 5 6 6   Desat Max Dur (sec) 0.0 114.0 67.0 114.0  Approx Min O2 during sleep 89  Approx min O2 during a respiratory event 89  Was Oxygen added (Y/N) and final rate :    LPM  *Desaturations based on 3% or greater drop from baseline.   Cheyne Stokes Breathing: None Present   Heart Rate Summary:  Average Heart Rate During Sleep 72.7 bpm      Highest Heart Rate During Sleep (95th %) 79.0 bpm      Highest Heart Rate During Sleep 94 bpm      Highest Heart Rate During Recording (TIB) 161 bpm (artifact)   Heart Rate Observations: Event Type # Events   Bradycardia 0 Lowest HR Scored: N/A  Sinus Tachycardia During Sleep 0 Highest HR Scored: N/A  Narrow Complex  Tachycardia 0 Highest HR Scored: N/A  Wide Complex Tachycardia 0 Highest HR Scored: N/A  Asystole 0 Longest Pause: N/A  Atrial Fibrillation 0 Duration Longest Event: N/A  Other Arrythmias   Type:    Periodic Limb Movement Data: (Primary legs unless otherwise noted) Total # Limb Movement 0 Limb Movement Index 0.0  Total # PLMS    PLMS Index     Total # PLMS Arousals    PLMS Arousal Index     Percentage Sleep Time with PLMS   min (   % sleep)  Mean Duration limb movements (secs)

## 2023-12-27 ENCOUNTER — Emergency Department
Admission: EM | Admit: 2023-12-27 | Discharge: 2023-12-27 | Disposition: A | Discharge status: Home / Self Care | Payer: MEDICAID | Attending: Emergency Medicine | Admitting: Emergency Medicine

## 2023-12-27 ENCOUNTER — Encounter: Payer: Self-pay | Admitting: *Deleted

## 2023-12-27 ENCOUNTER — Other Ambulatory Visit: Payer: Self-pay

## 2023-12-27 DIAGNOSIS — R4689 Other symptoms and signs involving appearance and behavior: Secondary | ICD-10-CM | POA: Diagnosis present

## 2023-12-27 LAB — CBC
HCT: 39.9 % (ref 36.0–46.0)
Hemoglobin: 13.2 g/dL (ref 12.0–15.0)
MCH: 32.9 pg (ref 26.0–34.0)
MCHC: 33.1 g/dL (ref 30.0–36.0)
MCV: 99.5 fL (ref 80.0–100.0)
Platelets: 228 10*3/uL (ref 150–400)
RBC: 4.01 MIL/uL (ref 3.87–5.11)
RDW: 12.8 % (ref 11.5–15.5)
WBC: 7.4 10*3/uL (ref 4.0–10.5)
nRBC: 0 % (ref 0.0–0.2)

## 2023-12-27 LAB — URINE DRUG SCREEN, QUALITATIVE (ARMC ONLY)
Amphetamines, Ur Screen: NOT DETECTED
Barbiturates, Ur Screen: NOT DETECTED
Benzodiazepine, Ur Scrn: NOT DETECTED
Cannabinoid 50 Ng, Ur ~~LOC~~: NOT DETECTED
Cocaine Metabolite,Ur ~~LOC~~: NOT DETECTED
MDMA (Ecstasy)Ur Screen: NOT DETECTED
Methadone Scn, Ur: NOT DETECTED
Opiate, Ur Screen: NOT DETECTED
Phencyclidine (PCP) Ur S: NOT DETECTED
Tricyclic, Ur Screen: POSITIVE — AB

## 2023-12-27 LAB — COMPREHENSIVE METABOLIC PANEL WITH GFR
ALT: 15 U/L (ref 0–44)
AST: 11 U/L — ABNORMAL LOW (ref 15–41)
Albumin: 4.3 g/dL (ref 3.5–5.0)
Alkaline Phosphatase: 46 U/L (ref 38–126)
Anion gap: 8 (ref 5–15)
BUN: 14 mg/dL (ref 6–20)
CO2: 22 mmol/L (ref 22–32)
Calcium: 9.2 mg/dL (ref 8.9–10.3)
Chloride: 107 mmol/L (ref 98–111)
Creatinine, Ser: 1.21 mg/dL — ABNORMAL HIGH (ref 0.44–1.00)
GFR, Estimated: >60 mL/min (ref >60)
Glucose, Bld: 110 mg/dL — ABNORMAL HIGH (ref 70–99)
Potassium: 3.9 mmol/L (ref 3.5–5.1)
Sodium: 137 mmol/L (ref 135–145)
Total Bilirubin: 0.2 mg/dL (ref 0.0–1.2)
Total Protein: 7.3 g/dL (ref 6.5–8.1)

## 2023-12-27 LAB — ETHANOL: Alcohol, Ethyl (B): <10 mg/dL (ref <10)

## 2023-12-27 LAB — ACETAMINOPHEN LEVEL: Acetaminophen (Tylenol), Serum: <10 ug/mL — ABNORMAL LOW (ref 10–30)

## 2023-12-27 LAB — SALICYLATE LEVEL: Salicylate Lvl: <7 mg/dL — ABNORMAL LOW (ref 7.0–30.0)

## 2023-12-27 LAB — POC URINE PREG, ED: Preg Test, Ur: NEGATIVE

## 2023-12-27 NOTE — ED Notes (Addendum)
 Attempted to call pt's legal guardian, [name], three times but she did not answer. Called the after hours [place] communications number to speak with the after hours Child psychotherapist and the person over the phone said she would have the social worker call me back.

## 2023-12-27 NOTE — ED Notes (Signed)
 Green shirt Camo pants Black sneakers Green socks Cross necklace Stone earrings Facilities manager

## 2023-12-27 NOTE — ED Notes (Signed)
 Caregiver at bedside
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
## 2023-12-27 NOTE — ED Notes (Signed)
 [place] DSS social worker, Ty Economist, called this RN back and said she would notify [name] of pt's discharge to group home

## 2023-12-27 NOTE — Discharge Instructions (Signed)
 Cleared for dc back to group home.

## 2023-12-27 NOTE — ED Notes (Signed)
 After hours number for patient and ask for after hours Child psychotherapist. [phone]

## 2023-12-27 NOTE — ED Notes (Signed)
 Pt discharged with caregiver, Mignon Pine to group home, [facility]

## 2023-12-27 NOTE — ED Notes (Signed)
Pt refused snack at this time.  

## 2024-01-03 NOTE — Progress Notes (Unsigned)
 PCP:  Carlean Jews, PA-C   No chief complaint on file.    HPI:      Ms. Crystal [name] is a 33 y.o. No obstetric history on file. whose LMP was Patient's last menstrual period was 12/23/2023 (approximate)., presents today for her NP annual examination.  Her menses are {norm/abn:715}, lasting {number: 22536} days.  Dysmenorrhea {dysmen:716}. She {does:18564} have intermenstrual bleeding.  Sex activity: {sex active: 315163}.  Last Pap: {BMWU:132440102}  Results were: {norm/abn:16707::"no abnormalities"} /neg HPV DNA *** Hx of STDs: {STD hx:14358}  Last mammogram: {date:304500300}  Results were: {norm/abn:13465} There is no FH of breast cancer. There is no FH of ovarian cancer. The patient {does:18564} do self-breast exams.  Tobacco use: {tob:20664} Alcohol use: {Alcohol:11675} No drug use.  Exercise: {exercise:31265}  She {does:18564} get adequate calcium and Vitamin D in her diet.  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  Social History   Socioeconomic History   Marital status: Single    Spouse name: Not on file   Number of children: Not on file   Years of education: Not on file   Highest education level: Not on file  Occupational History   Not on file  Tobacco Use   Smoking status: Former    Current packs/day: 0.00    Average packs/day: 0.5 packs/day for 12.0 years (6.0 ttl pk-yrs)    Types: Cigarettes    Start date: 09/2010    Quit date: 09/2022     Years since quitting: 1.2   Smokeless tobacco: Never  Vaping Use   Vaping status: Never Used  Substance and Sexual Activity   Alcohol use: Not Currently   Drug use: Not Currently   Sexual activity: Not Currently  Other Topics Concern   Not on file  Social History Narrative   Not on file   Social Drivers of Health   Financial Resource Strain: Not on file  Food Insecurity: Not on file  Transportation Needs: Not on file  Physical Activity: Not on file  Stress: Not on file  Social Connections: Not on file  Intimate Partner Violence: Not on file     Current Outpatient Medications:    busPIRone (BUSPAR) 30 MG tablet, Take 30 mg by mouth 2 (two) times daily., Disp: , Rfl:    cholecalciferol (VITAMIN D3) 25 MCG (1000 UNIT) tablet, Take 2,000 Units by mouth daily., Disp: , Rfl:    divalproex (DEPAKOTE) 250 MG DR tablet, Take 1 tablet (250 mg total) by mouth 2 (two) times daily., Disp: 120 tablet, Rfl: 0   dolutegravir-lamiVUDine (DOVATO) 50-300 MG tablet, Take 1 tablet by mouth daily., Disp: 30 tablet, Rfl: 11   gabapentin (NEURONTIN) 100 MG capsule, Take 1 capsule (100 mg total) by mouth 3 (three) times daily., Disp: 180 capsule, Rfl: 0   hydrOXYzine (ATARAX) 25 MG tablet, Take 1 tablet (25 mg total) by mouth 3 (three) times daily as needed., Disp: 30 tablet, Rfl: 0   hydrOXYzine (VISTARIL) 100 MG capsule, Take  100 mg by mouth at bedtime., Disp: , Rfl:    hydrOXYzine (VISTARIL) 25 MG capsule, Take 25 mg by mouth daily as needed., Disp: , Rfl:    lamoTRIgine (LAMICTAL) 25 MG tablet, Take 25 mg by mouth daily., Disp: , Rfl:    loratadine (CLARITIN) 10 MG tablet, Take 1 tablet (10 mg total) by mouth daily., Disp: 100 tablet, Rfl: 0   LORazepam (ATIVAN) 1 MG tablet, Take 1 mg by mouth as needed for anxiety., Disp: , Rfl:    melatonin 5 MG TABS, Take 5 mg by mouth at bedtime., Disp: , Rfl:    risperiDONE (RISPERDAL) 1 MG tablet, Take 1 tablet (1 mg total) by mouth 2 (two) times daily., Disp:  120 tablet, Rfl: 0   senna (SENOKOT) 8.6 MG TABS tablet, Take 1 tablet (8.6 mg total) by mouth daily as needed for mild constipation., Disp: 30 tablet, Rfl: 0   STOOL SOFTENER 100 MG capsule, Take 100 mg by mouth daily as needed for mild constipation., Disp: , Rfl:    topiramate (TOPAMAX) 25 MG tablet, Take 25 mg by mouth 2 (two) times daily., Disp: , Rfl:      ROS:  Review of Systems BREAST: No symptoms   Objective: LMP 12/23/2023 (Approximate)    OBGyn Exam  Results: No results found for this or any previous visit (from the past 24 hours).  Assessment/Plan: No diagnosis found.  No orders of the defined types were placed in this encounter.            GYN counsel {counseling: 16159}     F/U  No follow-ups on file.  Naleigha Raimondi B. Ashlinn Hemrick, PA-C 01/03/2024 4:43 PM

## 2024-01-04 ENCOUNTER — Other Ambulatory Visit (HOSPITAL_COMMUNITY)
Admission: RE | Admit: 2024-01-04 | Discharge: 2024-01-04 | Disposition: A | Discharge status: Home / Self Care | Payer: MEDICAID | Source: Ambulatory Visit | Attending: Obstetrics and Gynecology | Admitting: Obstetrics and Gynecology

## 2024-01-04 ENCOUNTER — Ambulatory Visit: Payer: MEDICAID | Admitting: Obstetrics and Gynecology

## 2024-01-04 ENCOUNTER — Encounter: Payer: Self-pay | Admitting: Obstetrics and Gynecology

## 2024-01-04 VITALS — BP 118/82 | Ht 67.0 in | Wt 206.0 lb

## 2024-01-04 DIAGNOSIS — Z30013 Encounter for initial prescription of injectable contraceptive: Secondary | ICD-10-CM

## 2024-01-04 DIAGNOSIS — Z124 Encounter for screening for malignant neoplasm of cervix: Secondary | ICD-10-CM | POA: Diagnosis present

## 2024-01-04 DIAGNOSIS — Z01419 Encounter for gynecological examination (general) (routine) without abnormal findings: Secondary | ICD-10-CM | POA: Diagnosis not present

## 2024-01-04 DIAGNOSIS — Z1151 Encounter for screening for human papillomavirus (HPV): Secondary | ICD-10-CM | POA: Insufficient documentation

## 2024-01-04 MED ORDER — MEDROXYPROGESTERONE ACETATE 150 MG/ML IM SUSP
150.0000 mg | INTRAMUSCULAR | Status: DC
  Administered 2024-01-04: 150 mg via INTRAMUSCULAR

## 2024-01-04 MED ORDER — MEDROXYPROGESTERONE ACETATE 150 MG/ML IM SUSP
150.0000 mg | Freq: Once | INTRAMUSCULAR | Status: DC
Start: 2024-01-04 — End: 2024-01-04

## 2024-01-04 NOTE — Patient Instructions (Signed)
 I value your feedback and you entrusting [place] with your care. If you get a [place] patient survey, I would appreciate you taking the time to let us know about your experience today. Thank you! ? ? ?

## 2024-01-04 NOTE — Addendum Note (Signed)
 Addended by: Rian Chafe D on: 01/04/2024 11:04 AM   Modules accepted: Orders

## 2024-01-06 LAB — CYTOLOGY - PAP
Comment: NEGATIVE
Diagnosis: NEGATIVE
High risk HPV: POSITIVE — AB

## 2024-01-14 ENCOUNTER — Encounter: Payer: Self-pay | Admitting: Obstetrics and Gynecology

## 2024-01-24 ENCOUNTER — Ambulatory Visit (INDEPENDENT_AMBULATORY_CARE_PROVIDER_SITE_OTHER): Payer: MEDICAID | Admitting: Physician Assistant

## 2024-01-24 ENCOUNTER — Telehealth: Payer: Self-pay | Admitting: Physician Assistant

## 2024-01-24 ENCOUNTER — Encounter: Payer: Self-pay | Admitting: Physician Assistant

## 2024-01-24 VITALS — BP 110/70 | HR 91 | Temp 97.8°F | Resp 16 | Ht 67.0 in | Wt 204.0 lb

## 2024-01-24 DIAGNOSIS — E66811 Obesity, class 1: Secondary | ICD-10-CM

## 2024-01-24 DIAGNOSIS — N6459 Other signs and symptoms in breast: Secondary | ICD-10-CM

## 2024-01-24 DIAGNOSIS — E782 Mixed hyperlipidemia: Secondary | ICD-10-CM | POA: Diagnosis not present

## 2024-01-24 DIAGNOSIS — N1831 Chronic kidney disease, stage 3a: Secondary | ICD-10-CM

## 2024-01-24 NOTE — Progress Notes (Signed)
 [facility] [address] [place], [place] [address]  Internal MEDICINE  Office Visit Note  Patient Name: Crystal [name]  [id number]  [id number]  Date of Service: 01/24/2024  Chief Complaint  Patient presents with   Follow-up    Labs    Other    Discomfort right forehead    HPI Pt is here for routine follow up -LDL elevated but has been working on diet and exercise already and has been losing weight--down14lbs since last visit. Renal function still reduced--sees nephrology -seeing GYN and had pap updated, also on depo -states abnormal places in both breasts when in [place], but no mammogram done and is recommended to have one. No records available as to exact findings. Will order mammogram -sleep study did not show OSA -some discomfort above right eye this past week. Skin cancer removed on this side previously, but near right nostril. No new lesions. Not hurting every day. May be due to changes in weather vs pt also had eye exam recently and has new glasses prescription that she has not started on yet. May have some eye strain contributing. Will monitor and call if any new symptoms. Denies any vision concerns today or other headache. May try tylenol  as needed.  Current Medication: Outpatient Encounter Medications as of 01/24/2024  Medication Sig   busPIRone  (BUSPAR ) 30 MG tablet Take 30 mg by mouth 2 (two) times daily.   Cholecalciferol (VITAMIN D3) 125 MCG (5000 UT) TABS Take by mouth.   dolutegravir -lamiVUDine  (DOVATO ) 50-300 MG tablet Take 1 tablet by mouth daily.   hydrOXYzine  (ATARAX ) 25 MG tablet Take 1 tablet (25 mg total) by mouth 3 (three) times daily as needed.   hydrOXYzine  (VISTARIL ) 50 MG capsule Take 50 mg by mouth 2 (two) times daily as needed.   lamoTRIgine (LAMICTAL) 150 MG tablet Take 150 mg by mouth 2 (two) times daily.   LORazepam  (ATIVAN ) 1 MG tablet Take 1 mg by mouth as needed for anxiety.   melatonin 5 MG TABS Take 5 mg by mouth at bedtime.    senna (SENOKOT) 8.6 MG TABS tablet Take 1 tablet (8.6 mg total) by mouth daily as needed for mild constipation.   STOOL SOFTENER 100 MG capsule Take 100 mg by mouth daily as needed for mild constipation.   topiramate  (TOPAMAX ) 25 MG tablet Take 25 mg by mouth 2 (two) times daily.   UZEDY  100 MG/0.28ML syringe    [DISCONTINUED] hydrOXYzine  (VISTARIL ) 25 MG capsule Take 25 mg by mouth daily as needed.   divalproex  (DEPAKOTE ) 250 MG DR tablet Take 1 tablet (250 mg total) by mouth 2 (two) times daily.   gabapentin  (NEURONTIN ) 100 MG capsule Take 1 capsule (100 mg total) by mouth 3 (three) times daily.   loratadine  (CLARITIN ) 10 MG tablet Take 1 tablet (10 mg total) by mouth daily.   [DISCONTINUED] cholecalciferol (VITAMIN D3) 25 MCG (1000 UNIT) tablet Take 2,000 Units by mouth daily.   [DISCONTINUED] hydrOXYzine  (VISTARIL ) 100 MG capsule Take 100 mg by mouth at bedtime.   [DISCONTINUED] lamoTRIgine (LAMICTAL) 25 MG tablet Take 25 mg by mouth daily.   [DISCONTINUED] risperiDONE  (RISPERDAL ) 1 MG tablet Take 1 tablet (1 mg total) by mouth 2 (two) times daily.   Facility-Administered Encounter Medications as of 01/24/2024  Medication   medroxyPROGESTERone  (DEPO-PROVERA ) injection 150 mg    Surgical History: Past Surgical History:  Procedure Laterality Date   SKIN SURGERY      Medical History: Past Medical History:  Diagnosis Date  Cancer (HCC)    skin   Depression    HIV (human immunodeficiency virus infection) (HCC)     Family History: No family history on file.  Social History   Socioeconomic History   Marital status: Single    Spouse name: Not on file   Number of children: Not on file   Years of education: Not on file   Highest education level: Not on file  Occupational History   Not on file  Tobacco Use   Smoking status: Former    Current packs/day: 0.00    Average packs/day: 0.5 packs/day for 12.0 years (6.0 ttl pk-yrs)    Types: Cigarettes    Start date: 09/2010     Quit date: 09/2022    Years since quitting: 1.3   Smokeless tobacco: Never  Vaping Use   Vaping status: Never Used  Substance and Sexual Activity   Alcohol use: Not Currently   Drug use: Not Currently   Sexual activity: Not Currently  Other Topics Concern   Not on file  Social History Narrative   Not on file   Social Drivers of Health   Financial Resource Strain: Not on file  Food Insecurity: Not on file  Transportation Needs: Not on file  Physical Activity: Not on file  Stress: Not on file  Social Connections: Not on file  Intimate Partner Violence: Not on file      Review of Systems  Constitutional:  Positive for fatigue. Negative for chills and unexpected weight change.  HENT:  Positive for postnasal drip. Negative for congestion, rhinorrhea, sneezing and sore throat.   Eyes:  Negative for redness.  Respiratory:  Negative for cough and chest tightness.   Cardiovascular:  Negative for chest pain and palpitations.  Gastrointestinal:  Negative for abdominal pain, constipation, diarrhea, nausea and vomiting.  Genitourinary:  Negative for dysuria and frequency.  Musculoskeletal:  Negative for arthralgias, back pain, joint swelling and neck pain.  Skin:  Negative for rash.  Neurological: Negative.  Negative for tremors and numbness.       Intermittent pain above right eye  Hematological:  Negative for adenopathy. Does not bruise/bleed easily.  Psychiatric/Behavioral:  Negative for behavioral problems (Depression) and suicidal ideas.     Vital Signs: BP 110/70   Pulse 91   Temp 97.8 F (36.6 C)   Resp 16   Ht 5\' 7"  (1.702 m)   Wt 204 lb (92.5 kg)   LMP 12/23/2023 (Approximate)   SpO2 95%   BMI 31.95 kg/m    Physical Exam Vitals and nursing note reviewed.  Constitutional:      General: She is not in acute distress.    Appearance: She is well-developed. She is not diaphoretic.  HENT:     Head: Normocephalic and atraumatic.     Comments: Min tender above right  eye, no lesions or skin changes    Right Ear: Tympanic membrane normal.  Eyes:     Extraocular Movements: Extraocular movements intact.  Neck:     Thyroid : No thyromegaly.     Vascular: No JVD.     Trachea: No tracheal deviation.  Cardiovascular:     Rate and Rhythm: Normal rate and regular rhythm.     Heart sounds: Normal heart sounds. No murmur heard.    No friction rub. No gallop.  Pulmonary:     Effort: Pulmonary effort is normal.     Breath sounds: Normal breath sounds.  Musculoskeletal:        General: Normal  range of motion.  Skin:    General: Skin is warm and dry.  Neurological:     Mental Status: She is alert.  Psychiatric:        Behavior: Behavior normal.        Thought Content: Thought content normal.        Judgment: Judgment normal.        Assessment/Plan: 1. Mixed hyperlipidemia (Primary) Continue to work on diet and exercise  2. Stage 3a chronic kidney disease (HCC) Followed by nephrology  3. Abnormal breast finding Reported abnormal breast findings bilaterally, but no records available. Will order mammogram - MM 3D DIAGNOSTIC MAMMOGRAM BILATERAL BREAST; Future  4. Obesity (BMI 30.0-34.9) Down 14lbs since last visit and encouraged to continue to work on this   General Counseling: Crystal [name] verbalizes understanding of the findings of todays visit and agrees with plan of treatment. I have discussed any further diagnostic evaluation that may be needed or ordered today. We also reviewed her medications today. she has been encouraged to call the office with any questions or concerns that should arise related to todays visit.    Orders Placed This Encounter  Procedures   MM 3D DIAGNOSTIC MAMMOGRAM BILATERAL BREAST    No orders of the defined types were placed in this encounter.   This patient was seen by Taylor Favia, PA-C in collaboration with Dr. Verneta Gone as a part of collaborative care agreement.   Total time spent:30 Minutes Time spent  includes review of chart, medications, test results, and follow up plan with the patient.      Dr Fozia M Khan Internal medicine

## 2024-01-24 NOTE — Telephone Encounter (Signed)
 Lvm for Crystal [name] w/ patient's facility to return my call regarding mammogram-Toni

## 2024-01-26 ENCOUNTER — Other Ambulatory Visit: Payer: Self-pay | Admitting: Physician Assistant

## 2024-01-26 DIAGNOSIS — N63 Unspecified lump in unspecified breast: Secondary | ICD-10-CM

## 2024-01-26 DIAGNOSIS — N6459 Other signs and symptoms in breast: Secondary | ICD-10-CM

## 2024-02-08 ENCOUNTER — Ambulatory Visit
Admission: RE | Admit: 2024-02-08 | Discharge: 2024-02-08 | Disposition: A | Discharge status: Home / Self Care | Payer: MEDICAID | Source: Ambulatory Visit | Attending: Physician Assistant | Admitting: Physician Assistant

## 2024-02-08 DIAGNOSIS — N63 Unspecified lump in unspecified breast: Secondary | ICD-10-CM

## 2024-02-08 DIAGNOSIS — N6459 Other signs and symptoms in breast: Secondary | ICD-10-CM | POA: Insufficient documentation

## 2024-02-22 ENCOUNTER — Ambulatory Visit: Payer: MEDICAID | Attending: Infectious Diseases | Admitting: Infectious Diseases

## 2024-02-22 ENCOUNTER — Other Ambulatory Visit
Admission: RE | Admit: 2024-02-22 | Discharge: 2024-02-22 | Disposition: A | Discharge status: Home / Self Care | Payer: MEDICAID | Source: Ambulatory Visit | Attending: Infectious Diseases | Admitting: Infectious Diseases

## 2024-02-22 ENCOUNTER — Encounter: Payer: Self-pay | Admitting: Infectious Diseases

## 2024-02-22 VITALS — BP 93/68 | HR 70 | Temp 98.4°F | Wt 199.0 lb

## 2024-02-22 DIAGNOSIS — Z79899 Other long term (current) drug therapy: Secondary | ICD-10-CM | POA: Diagnosis not present

## 2024-02-22 DIAGNOSIS — N189 Chronic kidney disease, unspecified: Secondary | ICD-10-CM | POA: Insufficient documentation

## 2024-02-22 DIAGNOSIS — Z87891 Personal history of nicotine dependence: Secondary | ICD-10-CM | POA: Insufficient documentation

## 2024-02-22 DIAGNOSIS — Z79624 Long term (current) use of inhibitors of nucleotide synthesis: Secondary | ICD-10-CM | POA: Insufficient documentation

## 2024-02-22 DIAGNOSIS — E785 Hyperlipidemia, unspecified: Secondary | ICD-10-CM | POA: Diagnosis not present

## 2024-02-22 DIAGNOSIS — Z21 Asymptomatic human immunodeficiency virus [HIV] infection status: Secondary | ICD-10-CM | POA: Diagnosis not present

## 2024-02-22 DIAGNOSIS — Z23 Encounter for immunization: Secondary | ICD-10-CM | POA: Diagnosis not present

## 2024-02-22 DIAGNOSIS — F79 Unspecified intellectual disabilities: Secondary | ICD-10-CM | POA: Insufficient documentation

## 2024-02-22 DIAGNOSIS — B20 Human immunodeficiency virus [HIV] disease: Secondary | ICD-10-CM | POA: Diagnosis not present

## 2024-02-22 LAB — HCG, QUANTITATIVE, PREGNANCY: hCG, Beta Chain, Quant, S: <1 m[IU]/mL (ref <5)

## 2024-02-22 NOTE — Patient Instructions (Signed)
 You are here today for regular hiv visit. You had a recent pap smear which was normal but had HPV - we will start HPV vaccine series for you. Which is 3 doses- Today -2 months and 6 months  . We discussed the limited benefit you can get even when given after age 33 .  You will also get 1 dose of pneumonia  vaccine

## 2024-02-22 NOTE — Progress Notes (Signed)
 NAME: Crystal [name]  DOB: 1990-10-15  MRN: [id number]  Date/Time: 02/22/2024 9:33 AM   Subjective:  Follow-up visit for HIV.  Last seen in Dec 2024  Since then saw Ob gyn and got pap- High risk HPV but no cytological changes Saw her PCP Also was in the ED in April -for behavioral issue She is on Dovato  She is doing well  Has gained weight ( 182>>147>>227> 199-- lost nearly 30 pounds since last seen Her last Vl < 20 and Cd4 > 1001 in sept 2024 She has no fever, night sweats, cough, sob, pain abdomen, blood in stool. Has good appetite.    Following taken from old records Crystal [name] is a 33 y.o. 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 Patient was on Complera  until 2021 as prescribed by Dr. Viva Grise.  On reviewing the records from Dr. Isidro Margo office she always had undetectable viral load and a high CD4 count.  HIV diagnosed ? Nadir Cd4 unknown VL unknown OI unknown HAARt history unknown Genotype  no resistance ? Past Medical History:  Diagnosis Date   Cancer (HCC)    skin   Depression    HIV (human immunodeficiency virus infection) (HCC)   Adjustment disorder Behavioral disorder Mild  intellectual disability Past Surgical History:  Procedure Laterality Date   SKIN SURGERY    Skin surgery nose for skin cancer Social History   Socioeconomic History   Marital status: Single    Spouse name: Not on file   Number of children: Not on file   Years of education: Not on file   Highest education level: Not on file  Occupational History   Not on file  Tobacco Use   Smoking status: Former    Current packs/day: 0.00    Average packs/day: 0.5 packs/day for 12.0 years (6.0 ttl pk-yrs)    Types: Cigarettes    Start date: 09/2010    Quit date: 09/2022    Years since quitting: 1.4   Smokeless tobacco: Never  Vaping Use   Vaping status: Never Used  Substance and  Sexual Activity   Alcohol use: Not Currently   Drug use: Not Currently   Sexual activity: Not Currently  Other Topics Concern   Not on file  Social History Narrative   Not on file   Social Drivers of Health   Financial Resource Strain: Not on file  Food Insecurity: Not on file  Transportation Needs: Not on file  Physical Activity: Not on file  Stress: Not on file  Social Connections: Not on file  Intimate Partner Violence: Not on file    Family History  Problem Relation Age of Onset   Breast cancer Neg Hx    No Known Allergies ? Current Outpatient Medications  Medication Sig Dispense Refill   busPIRone  (BUSPAR ) 30 MG tablet Take 30 mg by mouth 2 (two) times daily.     Cholecalciferol (VITAMIN D3) 125 MCG (5000 UT) TABS Take by mouth.     dolutegravir -lamiVUDine  (DOVATO ) 50-300 MG tablet Take 1 tablet by mouth daily. 30 tablet 11   hydrOXYzine  (ATARAX ) 25 MG tablet Take 1 tablet (25 mg total) by mouth 3 (three) times daily as needed. 30 tablet 0   hydrOXYzine  (VISTARIL ) 50 MG capsule Take 50 mg by mouth 2 (two) times daily as needed.     lamoTRIgine (LAMICTAL) 150 MG tablet Take 150 mg by mouth 2 (two) times  daily.     LORazepam  (ATIVAN ) 1 MG tablet Take 1 mg by mouth as needed for anxiety.     melatonin 5 MG TABS Take 5 mg by mouth at bedtime.     senna (SENOKOT) 8.6 MG TABS tablet Take 1 tablet (8.6 mg total) by mouth daily as needed for mild constipation. 30 tablet 0   STOOL SOFTENER 100 MG capsule Take 100 mg by mouth daily as needed for mild constipation.     topiramate  (TOPAMAX ) 25 MG tablet Take 25 mg by mouth 2 (two) times daily.     UZEDY  100 MG/0.28ML syringe      divalproex  (DEPAKOTE ) 250 MG DR tablet Take 1 tablet (250 mg total) by mouth 2 (two) times daily. 120 tablet 0   gabapentin  (NEURONTIN ) 100 MG capsule Take 1 capsule (100 mg total) by mouth 3 (three) times daily. 180 capsule 0   loratadine  (CLARITIN ) 10 MG tablet Take 1 tablet (10 mg total) by mouth daily.  100 tablet 0   Current Facility-Administered Medications  Medication Dose Route Frequency Provider Last Rate Last Admin   medroxyPROGESTERone  (DEPO-PROVERA ) injection 150 mg  150 mg Intramuscular Q90 days    150 mg at 01/04/24 1103    REVIEW OF SYSTEMS:  Const: negative fever, negative chills, 30 pound weight loss Eyes: negative diplopia or visual changes, negative eye pain ENT: negative coryza, negative sore throat Resp: negative cough, hemoptysis, dyspnea Cards: negative for chest pain, palpitations, lower extremity edema GU: negative for frequency, dysuria and hematuria Skin: negative for rash and pruritus Heme: negative for easy bruising and gum/nose bleeding MS: negative for myalgias, arthralgias, back pain and muscle weakness Neurolo:negative for headaches, dizziness, vertigo, memory problems  Psych: As above Objective:  VITALS:  BP 93/68   Pulse 70   Temp 98.4 F (36.9 C) (Temporal)   Wt 199 lb (90.3 kg)   SpO2 98%   BMI 31.17 kg/m   PHYSICAL EXAM:  General: Alert, cooperative, no distress, appears stated age.  Head: Normocephalic, without obvious abnormality, atraumatic. Eyes: Conjunctivae clear, anicteric sclerae. Pupils are equal Lungs: Clear to auscultation bilaterally. No Wheezing or Rhonchi. No rales. Heart: Regular rate and rhythm, no murmur, rub or gallop. Abdomen: not examined Extremities: Extremities normal, atraumatic, no cyanosis. No edema. No clubbing Skin: No rashes or lesions. Not Jaundiced Lymph: Cervical, supraclavicular normal. Neurologic: Grossly non-focal  Vaccine Date last given comment  Influenza    Hepatitis B    Hepatitis A    Prevnar-PCV-13    Pneumovac-PPSV-23    TdaP 04/30/21   HPV Started today 02/22/24   Shingrix ( zoster vaccine)     ______________________  Labs Lab Result  Date comment  HIV VL Less than 20 05/2023   CD4 05/2023    Genotype No resistance Geno sure prime on 12/17/2020   [id number]     HIV antibody     RPR  Nonreactive 05/2023   Quantiferon Gold Nonreactive 05/2023   Hep C ab Nonreactive 12/17/2020 12/17/2020  Hepatitis B-ab,ag,c 16.5 hepatitis B post vaccine antibodies    Hepatitis A-IgM, IgG /T Nonreactive    Lipid     GC/CHL     PAP     HB,PLT,Cr, LFT 13, 226, 1.11      Preventive  Procedure Result  Date comment  colonoscopy     Mammogram     Dental exam     Opthal       Impression/Recommendation ? HIV disease .   On  dovato   since  Feb 2023 Undetectable Vl and cd4 1000 Labs today  CKD - Now the crcl > 60 and cr is 1.29)  Weight gain of 80 pounds since June 2023 But has lost 30 pounds   Hyperlipidemia on atorvastatin   Restarted depo provera  by Ob HPV seen but no cytological changes She has not taken HPV vaccine Discussed HPV vaccine- shared decision making-started vaccine series- 0=2=6 month  Pt is only on risperdal , topomax buspar , depakote - for behavioral disorder, depression Has intellectual disability Discussed the management with the patient and the caregiver.  Follow up lab today Follow up 6 months

## 2024-02-23 LAB — HIV-1 RNA QUANT-NO REFLEX-BLD
HIV 1 RNA Quant: <20 {copies}/mL
LOG10 HIV-1 RNA: UNDETERMINED {Log_copies}/mL

## 2024-02-23 LAB — T-HELPER CELLS CD4/CD8 %
% CD 4 Pos. Lymph.: 50.5 % (ref 30.8–58.5)
Absolute CD 4 Helper: 1212 /uL (ref 359–1519)
Basophils Absolute: 0 10*3/uL (ref 0.0–0.2)
Basos: 1 %
CD3+CD4+ Cells/CD3+CD8+ Cells Bld: 1.94 (ref 0.92–3.72)
CD3+CD8+ Cells # Bld: 624 /uL (ref 109–897)
CD3+CD8+ Cells NFr Bld: 26 % (ref 12.0–35.5)
EOS (ABSOLUTE): 0 10*3/uL (ref 0.0–0.4)
Eos: 1 %
Hematocrit: 41.8 % (ref 34.0–46.6)
Hemoglobin: 13.8 g/dL (ref 11.1–15.9)
Immature Grans (Abs): 0.1 10*3/uL (ref 0.0–0.1)
Immature Granulocytes: 1 %
Lymphocytes Absolute: 2.4 10*3/uL (ref 0.7–3.1)
Lymphs: 28 %
MCH: 32.6 pg (ref 26.6–33.0)
MCHC: 33 g/dL (ref 31.5–35.7)
MCV: 99 fL — ABNORMAL HIGH (ref 79–97)
Monocytes Absolute: 0.7 10*3/uL (ref 0.1–0.9)
Monocytes: 8 %
Neutrophils Absolute: 5.5 10*3/uL (ref 1.4–7.0)
Neutrophils: 61 %
Platelets: 265 10*3/uL (ref 150–450)
RBC: 4.23 x10E6/uL (ref 3.77–5.28)
RDW: 11.9 % (ref 11.7–15.4)
WBC: 8.7 10*3/uL (ref 3.4–10.8)

## 2024-02-23 LAB — RPR: RPR Ser Ql: NONREACTIVE

## 2024-02-24 ENCOUNTER — Ambulatory Visit: Payer: Self-pay

## 2024-03-09 ENCOUNTER — Ambulatory Visit: Payer: MEDICAID | Admitting: Infectious Diseases

## 2024-03-14 ENCOUNTER — Other Ambulatory Visit: Payer: Self-pay

## 2024-03-14 MED ORDER — LORATADINE 10 MG PO TABS: 10.0000 mg | ORAL_TABLET | Freq: Every day | ORAL | 3 refills | Status: DC

## 2024-03-27 NOTE — Patient Instructions (Signed)

## 2024-03-28 ENCOUNTER — Ambulatory Visit: Payer: MEDICAID

## 2024-03-28 ENCOUNTER — Ambulatory Visit (INDEPENDENT_AMBULATORY_CARE_PROVIDER_SITE_OTHER): Payer: MEDICAID

## 2024-03-28 VITALS — BP 95/70 | Resp 16 | Ht 67.0 in | Wt 198.5 lb

## 2024-03-28 DIAGNOSIS — Z3042 Encounter for surveillance of injectable contraceptive: Secondary | ICD-10-CM | POA: Diagnosis not present

## 2024-03-28 MED ORDER — MEDROXYPROGESTERONE ACETATE 150 MG/ML IM SUSP
150.0000 mg | Freq: Once | INTRAMUSCULAR | Status: AC
  Administered 2024-03-28: 150 mg via INTRAMUSCULAR

## 2024-03-28 NOTE — Progress Notes (Signed)
    NURSE VISIT NOTE  Subjective:    Patient ID: Crystal [name], female    DOB: 06/20/1991, 33 y.o.   MRN: [id number]  HPI  Patient is a 33 y.o. G0P0000 female who presents for depo provera  injection.   Objective:    BP 95/70   Resp 16   Ht 5' 7 (1.702 m)   Wt 198 lb 8 oz (90 kg)   BMI 31.09 kg/m  Last Annual: 01/04/2024. Last pap: 01/04/2024. Last Depo-Provera : 01/04/2024. Side Effects if any: none. Serum HCG indicated? No . Depo-Provera  150 mg IM given by: Camelia Fetters, CMA. Site: Right Deltoid  Lab Review  No Labs  Assessment:   1. Encounter for management and injection of depo-Provera       Plan:   Next appointment due between Sept.23 and October 8.    Camelia Fetters, CMA  [facility]

## 2024-04-17 ENCOUNTER — Other Ambulatory Visit: Payer: Self-pay

## 2024-04-17 ENCOUNTER — Emergency Department
Admission: EM | Admit: 2024-04-17 | Discharge: 2024-04-18 | Disposition: A | Discharge status: Nursing Home (Includes ICF) | Payer: MEDICAID | Attending: Emergency Medicine | Admitting: Emergency Medicine

## 2024-04-17 DIAGNOSIS — R4589 Other symptoms and signs involving emotional state: Secondary | ICD-10-CM | POA: Diagnosis not present

## 2024-04-17 DIAGNOSIS — R45850 Homicidal ideations: Secondary | ICD-10-CM | POA: Insufficient documentation

## 2024-04-17 DIAGNOSIS — F39 Unspecified mood [affective] disorder: Secondary | ICD-10-CM | POA: Insufficient documentation

## 2024-04-17 DIAGNOSIS — R45851 Suicidal ideations: Secondary | ICD-10-CM | POA: Diagnosis not present

## 2024-04-17 LAB — CBC
HCT: 36.9 % (ref 36.0–46.0)
Hemoglobin: 12.4 g/dL (ref 12.0–15.0)
MCH: 32.1 pg (ref 26.0–34.0)
MCHC: 33.6 g/dL (ref 30.0–36.0)
MCV: 95.6 fL (ref 80.0–100.0)
Platelets: 254 K/uL (ref 150–400)
RBC: 3.86 MIL/uL — ABNORMAL LOW (ref 3.87–5.11)
RDW: 12.5 % (ref 11.5–15.5)
WBC: 9.8 K/uL (ref 4.0–10.5)
nRBC: 0 % (ref 0.0–0.2)

## 2024-04-17 LAB — COMPREHENSIVE METABOLIC PANEL WITH GFR
ALT: 12 U/L (ref 0–44)
AST: 15 U/L (ref 15–41)
Albumin: 4.2 g/dL (ref 3.5–5.0)
Alkaline Phosphatase: 54 U/L (ref 38–126)
Anion gap: 12 (ref 5–15)
BUN: 11 mg/dL (ref 6–20)
CO2: 19 mmol/L — ABNORMAL LOW (ref 22–32)
Calcium: 9.5 mg/dL (ref 8.9–10.3)
Chloride: 108 mmol/L (ref 98–111)
Creatinine, Ser: 1.2 mg/dL — ABNORMAL HIGH (ref 0.44–1.00)
GFR, Estimated: >60 mL/min (ref >60)
Glucose, Bld: 146 mg/dL — ABNORMAL HIGH (ref 70–99)
Potassium: 3.7 mmol/L (ref 3.5–5.1)
Sodium: 139 mmol/L (ref 135–145)
Total Bilirubin: 0.3 mg/dL (ref 0.0–1.2)
Total Protein: 7.2 g/dL (ref 6.5–8.1)

## 2024-04-17 LAB — URINE DRUG SCREEN, QUALITATIVE (ARMC ONLY)
Amphetamines, Ur Screen: NOT DETECTED
Barbiturates, Ur Screen: NOT DETECTED
Benzodiazepine, Ur Scrn: NOT DETECTED
Cannabinoid 50 Ng, Ur ~~LOC~~: NOT DETECTED
Cocaine Metabolite,Ur ~~LOC~~: NOT DETECTED
MDMA (Ecstasy)Ur Screen: NOT DETECTED
Methadone Scn, Ur: NOT DETECTED
Opiate, Ur Screen: NOT DETECTED
Phencyclidine (PCP) Ur S: NOT DETECTED
Tricyclic, Ur Screen: POSITIVE — AB

## 2024-04-17 LAB — ETHANOL: Alcohol, Ethyl (B): <15 mg/dL (ref <15)

## 2024-04-17 NOTE — ED Notes (Addendum)
 Pt dressed out by this RN and EDT [place].  Pt belongings: 1 necklace 2 earrings 2 bracelets 2 sandals Dress Underwear bra
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
## 2024-04-17 NOTE — ED Notes (Signed)
 Report called to leslie rn [facility] nurse
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
## 2024-04-17 NOTE — BH Assessment (Signed)
 Writer placed psych consult with [facility] for patient to be seen.
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
## 2024-04-17 NOTE — ED Notes (Signed)
 Pt given snack and drink. Pt calm and cooperative.

## 2024-04-17 NOTE — ED Notes (Signed)
 This RN called legal guardian Jovetrice Melodye to make her aware pt was here.

## 2024-04-17 NOTE — ED Notes (Signed)
 Psych staff member speaking with patient in room

## 2024-04-17 NOTE — ED Notes (Signed)
Dinner tray was provided to pt.

## 2024-04-17 NOTE — ED Notes (Signed)
 Pt. Alert and oriented, warm and dry, in no distress. Pt. Denies SI, HI, and AVH. Pt asked for a bible. Writer able to get pt the New Testament. Pt. Encouraged to let nursing staff know of any concerns or needs.  ENVIRONMENTAL ASSESSMENT Potentially harmful objects out of patient reach: Yes.   Personal belongings secured: Yes.   Patient dressed in hospital provided attire only: Yes.   Plastic bags out of patient reach: Yes.   Patient care equipment (cords, cables, call bells, lines, and drains) shortened, removed, or accounted for: Yes.   Equipment and supplies removed from bottom of stretcher: Yes.   Potentially toxic materials out of patient reach: Yes.   Sharps container removed or out of patient reach: Yes.
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
## 2024-04-18 DIAGNOSIS — R45850 Homicidal ideations: Secondary | ICD-10-CM

## 2024-04-18 DIAGNOSIS — F39 Unspecified mood [affective] disorder: Secondary | ICD-10-CM

## 2024-04-18 DIAGNOSIS — R45851 Suicidal ideations: Secondary | ICD-10-CM
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
## 2024-04-18 NOTE — ED Notes (Signed)
 Pt up at window stating she does not want to go to group home. RN educated pt on awaiting update. Pt verbalized understanding.

## 2024-04-18 NOTE — ED Notes (Signed)
 Pt given snack and drink at this time. Pt is well appearing. Pt denies no other needs at this time.

## 2024-04-18 NOTE — ED Notes (Signed)
 Pt discharged at this time. RN reviewed discharge instructions with pt and group home transporter. Pt and group home verbalized understanding. Vital signs taken. RR even and unlabored. Pt denies any questions or needs at this time. Pt ambulatory at discharge with belongings to group home.

## 2024-04-18 NOTE — ED Notes (Signed)
 Group home called with no answer at this time, voice message left.

## 2024-04-18 NOTE — ED Notes (Signed)
 Pt up at window saying I do not want to go to group home. RN educated pt on awaiting update and remaining patient. Pt verbalized understanding.

## 2024-04-18 NOTE — ED Notes (Signed)
 Pt knocking at window I am nervous, I don't want to go to group home. Provider and care team aware.

## 2024-04-18 NOTE — ED Notes (Signed)
VOL  pending  consult 

## 2024-04-18 NOTE — Progress Notes (Signed)
 [facility] spoke to [employer] Building surveyor- [phone]) at [facility].  Vivian confirmed that patient can return to group home.  Patient will be picked up at 1:00PM today.   [place], [facility] [phone]

## 2024-04-18 NOTE — ED Provider Notes (Signed)
 Emergency Medicine Observation Re-evaluation Note  Crystal [name] is a 32 y.o. female, seen on rounds today.  Pt initially presented to the ED for complaints of Suicidal Currently, the patient is is from a group home, not on IVC has thoughts of harming herself  Physical Exam  BP (!) 131/91   Pulse 73   Temp 98 F (36.7 C) (Oral)   Resp 20   Ht 5' 7 (1.702 m)   Wt 90 kg   SpO2 100%   BMI 31.08 kg/m  Physical Exam General: Sleeping, in no distress, did not wake   ED Course / MDM  EKG:   I have reviewed the labs performed to date as well as medications administered while in observation.  Recent changes in the last 24 hours include no acute events overnight  Plan  Current plan is for psychiatry evaluation and disposition    Nicholaus Rolland BRAVO, MD 04/18/24 [phone]

## 2024-04-18 NOTE — ED Notes (Signed)
 Pt finished with shower at this time. All shower items returned to staff.

## 2024-05-02 ENCOUNTER — Ambulatory Visit: Payer: MEDICAID | Attending: Infectious Diseases

## 2024-05-02 DIAGNOSIS — Z23 Encounter for immunization: Secondary | ICD-10-CM | POA: Diagnosis not present

## 2024-05-12 NOTE — Progress Notes (Signed)
 HPV vaccine given.

## 2024-06-09 ENCOUNTER — Other Ambulatory Visit: Payer: Self-pay | Admitting: Physician Assistant

## 2024-06-13 ENCOUNTER — Ambulatory Visit: Payer: MEDICAID

## 2024-06-13 VITALS — BP 106/71 | HR 76 | Ht 67.0 in | Wt 194.0 lb

## 2024-06-13 DIAGNOSIS — Z3042 Encounter for surveillance of injectable contraceptive: Secondary | ICD-10-CM | POA: Diagnosis not present

## 2024-06-13 MED ORDER — MEDROXYPROGESTERONE ACETATE 150 MG/ML IM SUSY
150.0000 mg | PREFILLED_SYRINGE | Freq: Once | INTRAMUSCULAR | Status: AC
  Administered 2024-06-13: 150 mg via INTRAMUSCULAR

## 2024-06-13 NOTE — Progress Notes (Signed)
    NURSE VISIT NOTE  Subjective:    Patient ID: Crystal [name], female    DOB: 03/23/1991, 33 y.o.   MRN: [id number]  HPI  Patient is a 33 y.o. G0P0000 female who presents for depo provera  injection.   Objective:    There were no vitals taken for this visit.  Last Annual: 01/04/24. Last pap: 01/04/24. Last Depo-Provera : 01/04/24. Side Effects if any: none. Serum HCG indicated? No . Depo-Provera  150 mg IM given by: Annalee Sanders, CMA. Site: Left Deltoid  Lab Review  No results found for any visits on 06/13/24.  Assessment:   1. Encounter for management and injection of depo-Provera       Plan:   Next appointment due between NOV 25 DEC 9     Annalee VEAR Sanders, CMA

## 2024-07-27 ENCOUNTER — Encounter: Payer: Self-pay | Admitting: Physician Assistant

## 2024-07-27 ENCOUNTER — Ambulatory Visit: Payer: MEDICAID | Admitting: Physician Assistant

## 2024-07-27 VITALS — BP 130/70 | HR 82 | Temp 98.3°F | Resp 16 | Ht 67.0 in | Wt 196.2 lb

## 2024-07-27 DIAGNOSIS — Z0001 Encounter for general adult medical examination with abnormal findings: Secondary | ICD-10-CM

## 2024-07-27 DIAGNOSIS — E66811 Obesity, class 1: Secondary | ICD-10-CM | POA: Diagnosis not present

## 2024-07-27 DIAGNOSIS — Z23 Encounter for immunization: Secondary | ICD-10-CM

## 2024-07-27 MED ORDER — ACETAMINOPHEN 500 MG PO TABS: 500.0000 mg | ORAL_TABLET | Freq: Four times a day (QID) | ORAL | 0 refills | Status: DC | PRN

## 2024-07-27 NOTE — Progress Notes (Signed)
 [facility] [address] [place], [place] [address]  Internal MEDICINE  Office Visit Note  Patient Name: Crystal [name]  [id number]  [id number]  Date of Service: 07/27/2024  Chief Complaint  Patient presents with   Annual Exam   Medication Refill    Tylenol  500mg      HPI Pt is here for routine health maintenance examination, her caregiver is with her -Pap done by OBGYN, also followed for depo-provera  shots -followed by infectious disease for HIV, and HPV vaccinations -sleeping well -exercise includes leg ups, not much walking -eating well, normal appetite -requests script to be able to take tylenol  as needed for occasional headaches  Current Medication: Outpatient Encounter Medications as of 07/27/2024  Medication Sig   acetaminophen  (TYLENOL ) 500 MG tablet Take 1 tablet (500 mg total) by mouth every 6 (six) hours as needed.   ALLERGY RELIEF 10 MG tablet TAKE 1 TABLET BY MOUTH ONCE DAILY   busPIRone  (BUSPAR ) 30 MG tablet Take 30 mg by mouth 2 (two) times daily.   Cholecalciferol (VITAMIN D3) 125 MCG (5000 UT) TABS Take by mouth.   divalproex  (DEPAKOTE ) 250 MG DR tablet Take 1 tablet (250 mg total) by mouth 2 (two) times daily.   dolutegravir -lamiVUDine  (DOVATO ) 50-300 MG tablet Take 1 tablet by mouth daily.   gabapentin  (NEURONTIN ) 100 MG capsule Take 1 capsule (100 mg total) by mouth 3 (three) times daily.   hydrOXYzine  (ATARAX ) 25 MG tablet Take 1 tablet (25 mg total) by mouth 3 (three) times daily as needed.   hydrOXYzine  (VISTARIL ) 50 MG capsule Take 50 mg by mouth 2 (two) times daily as needed.   lamoTRIgine (LAMICTAL) 150 MG tablet Take 150 mg by mouth 2 (two) times daily.   LORazepam  (ATIVAN ) 1 MG tablet Take 1 mg by mouth as needed for anxiety.   melatonin 5 MG TABS Take 5 mg by mouth at bedtime.   senna (SENOKOT) 8.6 MG TABS tablet Take 1 tablet (8.6 mg total) by mouth daily as needed for mild constipation.   STOOL SOFTENER 100 MG capsule Take 100 mg  by mouth daily as needed for mild constipation.   topiramate  (TOPAMAX ) 25 MG tablet Take 25 mg by mouth 2 (two) times daily.   UZEDY  100 MG/0.28ML syringe    No facility-administered encounter medications on file as of 07/27/2024.    Surgical History: Past Surgical History:  Procedure Laterality Date   SKIN SURGERY      Medical History: Past Medical History:  Diagnosis Date   Cancer (HCC)    skin   Depression    HIV (human immunodeficiency virus infection) (HCC)     Family History: Family History  Problem Relation Age of Onset   Breast cancer Neg Hx       Review of Systems  Constitutional:  Positive for fatigue. Negative for chills and unexpected weight change.  HENT:  Positive for postnasal drip. Negative for congestion, rhinorrhea, sneezing and sore throat.   Eyes:  Negative for redness.  Respiratory:  Negative for cough and chest tightness.   Cardiovascular:  Negative for chest pain and palpitations.  Gastrointestinal:  Negative for abdominal pain, constipation, diarrhea, nausea and vomiting.  Genitourinary:  Negative for dysuria and frequency.  Musculoskeletal:  Negative for arthralgias, back pain, joint swelling and neck pain.  Skin:  Negative for rash.  Neurological: Negative.  Negative for tremors and numbness.  Hematological:  Negative for adenopathy. Does not bruise/bleed easily.  Psychiatric/Behavioral:  Negative for behavioral problems (Depression), sleep disturbance  and suicidal ideas.      Vital Signs: BP 130/70   Pulse 82   Temp 98.3 F (36.8 C)   Resp 16   Ht 5' 7 (1.702 m)   Wt 196 lb 3.2 oz (89 kg)   SpO2 98%   BMI 30.73 kg/m    Physical Exam Vitals and nursing note reviewed.  Constitutional:      General: She is not in acute distress.    Appearance: She is well-developed. She is not diaphoretic.  HENT:     Head: Normocephalic and atraumatic.     Mouth/Throat:     Pharynx: No posterior oropharyngeal erythema.  Eyes:     Extraocular  Movements: Extraocular movements intact.  Neck:     Thyroid : No thyromegaly.     Vascular: No JVD.     Trachea: No tracheal deviation.  Cardiovascular:     Rate and Rhythm: Normal rate and regular rhythm.     Heart sounds: Normal heart sounds. No murmur heard.    No friction rub. No gallop.  Pulmonary:     Effort: Pulmonary effort is normal.     Breath sounds: Normal breath sounds.  Abdominal:     General: Bowel sounds are normal.     Tenderness: There is no abdominal tenderness.  Musculoskeletal:        General: Normal range of motion.     Cervical back: Normal range of motion.  Skin:    General: Skin is warm and dry.  Neurological:     Mental Status: She is alert.     Motor: No weakness.  Psychiatric:        Behavior: Behavior normal.        Thought Content: Thought content normal.        Judgment: Judgment normal.      LABS: No results found for this or any previous visit (from the past 2160 hours).      Assessment/Plan: 1. Encounter for general adult medical examination with abnormal findings (Primary) CPE performed, UTD on PHM-getting HPV vaccines at ID  2. Needs flu shot - Influenza, MDCK, trivalent, PF(Flucelvax egg-free)  3. Obesity (BMI 30.0-34.9) Work on diet and exercise   General Counseling: Crystal [name] verbalizes understanding of the findings of todays visit and agrees with plan of treatment. I have discussed any further diagnostic evaluation that may be needed or ordered today. We also reviewed her medications today. she has been encouraged to call the office with any questions or concerns that should arise related to todays visit.    Counseling:    Orders Placed This Encounter  Procedures   Influenza, MDCK, trivalent, PF(Flucelvax egg-free)    Meds ordered this encounter  Medications   acetaminophen  (TYLENOL ) 500 MG tablet    Sig: Take 1 tablet (500 mg total) by mouth every 6 (six) hours as needed.    Dispense:  30 tablet    Refill:  0     This patient was seen by Tinnie Pro, PA-C in collaboration with Dr. Sigrid Bathe as a part of collaborative care agreement.  Total time spent:30 Minutes  Time spent includes review of chart, medications, test results, and follow up plan with the patient.     Sigrid CHRISTELLA Bathe, MD  Internal Medicine

## 2024-08-22 ENCOUNTER — Ambulatory Visit: Payer: MEDICAID | Attending: Infectious Diseases | Admitting: Infectious Diseases

## 2024-08-22 ENCOUNTER — Encounter: Payer: Self-pay | Admitting: Infectious Diseases

## 2024-08-22 ENCOUNTER — Other Ambulatory Visit
Admission: RE | Admit: 2024-08-22 | Discharge: 2024-08-22 | Disposition: A | Payer: MEDICAID | Source: Ambulatory Visit | Attending: Infectious Diseases | Admitting: Infectious Diseases

## 2024-08-22 VITALS — BP 92/68 | HR 80 | Temp 96.8°F | Ht 64.76 in | Wt 190.0 lb

## 2024-08-22 DIAGNOSIS — Z793 Long term (current) use of hormonal contraceptives: Secondary | ICD-10-CM | POA: Insufficient documentation

## 2024-08-22 DIAGNOSIS — F4320 Adjustment disorder, unspecified: Secondary | ICD-10-CM | POA: Insufficient documentation

## 2024-08-22 DIAGNOSIS — Z79899 Other long term (current) drug therapy: Secondary | ICD-10-CM | POA: Insufficient documentation

## 2024-08-22 DIAGNOSIS — B20 Human immunodeficiency virus [HIV] disease: Secondary | ICD-10-CM | POA: Insufficient documentation

## 2024-08-22 DIAGNOSIS — F70 Mild intellectual disabilities: Secondary | ICD-10-CM | POA: Insufficient documentation

## 2024-08-22 DIAGNOSIS — F79 Unspecified intellectual disabilities: Secondary | ICD-10-CM

## 2024-08-22 DIAGNOSIS — Z23 Encounter for immunization: Secondary | ICD-10-CM | POA: Diagnosis not present

## 2024-08-22 DIAGNOSIS — R87810 Cervical high risk human papillomavirus (HPV) DNA test positive: Secondary | ICD-10-CM | POA: Insufficient documentation

## 2024-08-22 DIAGNOSIS — E785 Hyperlipidemia, unspecified: Secondary | ICD-10-CM | POA: Diagnosis not present

## 2024-08-22 DIAGNOSIS — F32A Depression, unspecified: Secondary | ICD-10-CM | POA: Insufficient documentation

## 2024-08-22 DIAGNOSIS — Z79624 Long term (current) use of inhibitors of nucleotide synthesis: Secondary | ICD-10-CM | POA: Insufficient documentation

## 2024-08-22 DIAGNOSIS — F919 Conduct disorder, unspecified: Secondary | ICD-10-CM | POA: Insufficient documentation

## 2024-08-22 DIAGNOSIS — N189 Chronic kidney disease, unspecified: Secondary | ICD-10-CM | POA: Insufficient documentation

## 2024-08-22 LAB — CBC WITH DIFFERENTIAL/PLATELET
Abs Immature Granulocytes: 0.07 K/uL (ref 0.00–0.07)
Basophils Absolute: 0.1 K/uL (ref 0.0–0.1)
Basophils Relative: 1 %
Eosinophils Absolute: 0.1 K/uL (ref 0.0–0.5)
Eosinophils Relative: 1 %
HCT: 36.7 % (ref 36.0–46.0)
Hemoglobin: 12.3 g/dL (ref 12.0–15.0)
Immature Granulocytes: 1 %
Lymphocytes Relative: 28 %
Lymphs Abs: 2.4 K/uL (ref 0.7–4.0)
MCH: 31.5 pg (ref 26.0–34.0)
MCHC: 33.5 g/dL (ref 30.0–36.0)
MCV: 93.9 fL (ref 80.0–100.0)
Monocytes Absolute: 0.7 K/uL (ref 0.1–1.0)
Monocytes Relative: 8 %
Neutro Abs: 5.4 K/uL (ref 1.7–7.7)
Neutrophils Relative %: 61 %
Platelets: 251 K/uL (ref 150–400)
RBC: 3.91 MIL/uL (ref 3.87–5.11)
RDW: 12.5 % (ref 11.5–15.5)
WBC: 8.8 K/uL (ref 4.0–10.5)
nRBC: 0 % (ref 0.0–0.2)

## 2024-08-22 LAB — COMPREHENSIVE METABOLIC PANEL WITH GFR
ALT: 13 U/L (ref 0–44)
AST: 10 U/L — ABNORMAL LOW (ref 15–41)
Albumin: 4.5 g/dL (ref 3.5–5.0)
Alkaline Phosphatase: 66 U/L (ref 38–126)
Anion gap: 11 (ref 5–15)
BUN: 13 mg/dL (ref 6–20)
CO2: 21 mmol/L — ABNORMAL LOW (ref 22–32)
Calcium: 9.2 mg/dL (ref 8.9–10.3)
Chloride: 107 mmol/L (ref 98–111)
Creatinine, Ser: 1.3 mg/dL — ABNORMAL HIGH (ref 0.44–1.00)
GFR, Estimated: 55 mL/min — ABNORMAL LOW (ref >60)
Glucose, Bld: 94 mg/dL (ref 70–99)
Potassium: 4.3 mmol/L (ref 3.5–5.1)
Sodium: 139 mmol/L (ref 135–145)
Total Bilirubin: 0.3 mg/dL (ref 0.0–1.2)
Total Protein: 7.1 g/dL (ref 6.5–8.1)

## 2024-08-22 MED ORDER — DOLUTEGRAVIR-LAMIVUDINE 50-300 MG PO TABS: 1.0000 | ORAL_TABLET | Freq: Every day | ORAL | 11 refills | Status: AC

## 2024-08-22 NOTE — Addendum Note (Signed)
 Addended by: VEVA MOTTS T on: 08/22/2024 03:32 PM   Modules accepted: Orders

## 2024-08-22 NOTE — Progress Notes (Signed)
 NAME: Tanaka Gillen  DOB: [birth year]  MRN: [id number]  Date/Time: 08/22/2024 9:26 AM   Subjective:  Follow-up visit for HIV.  Last seen in June 2025  Here from Group home with assistant Verneita On dovato - 100% adherent doing well Last Vl < 20 and cd4 is 1200 Saw Renal physycamin Nov and she has cleared her    Had gained weight 80 pounds ( 182>>147>>227> 199->190- but now steadily losing it ( 40 pounds so far) Her last Vl < 20 and Cd4 > 1200 in June 2025 She has no fever, night sweats, cough, sob, pain abdomen, blood in stool. Has good appetite.    Following taken from old records Cassadi Purdie is a 33 y.o. with a history of mild intellectual disability and adjustment disorder with behavioral disturbances  She was diagnosed with HIV when she was 34 years old. Patient was on Complera  until 2021 as prescribed by Dr. Tamea.  On reviewing the records from Dr. Evalyn office she always had undetectable viral load and a high CD4 count.  HIV diagnosed ? Nadir Cd4 unknown VL unknown OI unknown HAARt history unknown Genotype  no resistance ? Past Medical History:  Diagnosis Date   Cancer (HCC)    skin   Depression    HIV (human immunodeficiency virus infection) (HCC)   Adjustment disorder Behavioral disorder Mild  intellectual disability Past Surgical History:  Procedure Laterality Date   SKIN SURGERY    Skin surgery nose for skin cancer Social History   Socioeconomic History   Marital status: Single    Spouse name: Not on file   Number of children: Not on file   Years of education: Not on file   Highest education level: Not on file  Occupational History   Not on file  Tobacco Use   Smoking status: Former    Current packs/day: 0.00    Average packs/day: 0.5 packs/day for 12.0 years (6.0 ttl pk-yrs)    Types: Cigarettes    Start date: 09/2010    Quit date: 09/2022    Years since quitting: 1.9   Smokeless tobacco: Never  Vaping Use   Vaping status: Never Used   Substance and Sexual Activity   Alcohol use: Not Currently   Drug use: Not Currently   Sexual activity: Not Currently  Other Topics Concern   Not on file  Social History Narrative   Not on file   Social Drivers of Health   Financial Resource Strain: Not on file  Food Insecurity: Not on file  Transportation Needs: Not on file  Physical Activity: Not on file  Stress: Not on file  Social Connections: Not on file  Intimate Partner Violence: Not on file    Family History  Problem Relation Age of Onset   Breast cancer Neg Hx    No Known Allergies ? Current Outpatient Medications  Medication Sig Dispense Refill   acetaminophen  (TYLENOL ) 500 MG tablet Take 1 tablet (500 mg total) by mouth every 6 (six) hours as needed. 30 tablet 0   ALLERGY RELIEF 10 MG tablet TAKE 1 TABLET BY MOUTH ONCE DAILY 2 tablet 10   busPIRone  (BUSPAR ) 30 MG tablet Take 30 mg by mouth 2 (two) times daily.     Cholecalciferol (VITAMIN D3) 125 MCG (5000 UT) TABS Take by mouth.     divalproex  (DEPAKOTE ) 250 MG DR tablet Take 1 tablet (250 mg total) by mouth 2 (two) times daily. 120 tablet 0   dolutegravir -lamiVUDine  (DOVATO ) 50-300 MG tablet Take 1 tablet  by mouth daily. 30 tablet 11   gabapentin  (NEURONTIN ) 100 MG capsule Take 1 capsule (100 mg total) by mouth 3 (three) times daily. 180 capsule 0   hydrOXYzine  (ATARAX ) 25 MG tablet Take 1 tablet (25 mg total) by mouth 3 (three) times daily as needed. 30 tablet 0   hydrOXYzine  (VISTARIL ) 50 MG capsule Take 50 mg by mouth 2 (two) times daily as needed.     lamoTRIgine (LAMICTAL) 150 MG tablet Take 150 mg by mouth 2 (two) times daily.     LORazepam  (ATIVAN ) 1 MG tablet Take 1 mg by mouth as needed for anxiety.     melatonin 5 MG TABS Take 5 mg by mouth at bedtime.     senna (SENOKOT) 8.6 MG TABS tablet Take 1 tablet (8.6 mg total) by mouth daily as needed for mild constipation. 30 tablet 0   STOOL SOFTENER 100 MG capsule Take 100 mg by mouth daily as needed for  mild constipation.     topiramate  (TOPAMAX ) 25 MG tablet Take 25 mg by mouth 2 (two) times daily.     UZEDY  100 MG/0.28ML syringe      No current facility-administered medications for this visit.    REVIEW OF SYSTEMS:  Const: negative fever, negative chills, 40 pound weight loss Eyes: negative diplopia or visual changes, negative eye pain ENT: negative coryza, negative sore throat Resp: negative cough, hemoptysis, dyspnea Cards: negative for chest pain, palpitations, lower extremity edema GU: negative for frequency, dysuria and hematuria Skin: negative for rash and pruritus Heme: negative for easy bruising and gum/nose bleeding MS: negative for myalgias, arthralgias, back pain and muscle weakness Neurolo:negative for headaches, dizziness, vertigo, memory problems  Psych: As above Objective:  VITALS:  BP 92/68   Pulse 80   Temp (!) 96.8 F (36 C) (Temporal)   Ht 5' 4.76 (1.645 m)   Wt 190 lb (86.2 kg)   SpO2 97%   BMI 31.85 kg/m   PHYSICAL EXAM:  General: looks well Head: Normocephalic, without obvious abnormality, atraumatic. Eyes: Conjunctivae clear, anicteric sclerae. Pupils are equal Lungs: Clear to auscultation bilaterally. No Wheezing or Rhonchi. No rales. Heart: Regular rate and rhythm, no murmur, rub or gallop. Abdomen: not examined Extremities: Extremities normal, atraumatic, no cyanosis. No edema. No clubbing Skin: No rashes or lesions. Not Jaundiced Lymph: Cervical, supraclavicular normal. Neurologic: Grossly non-focal  Vaccine Date last given comment  Influenza    Hepatitis B    Hepatitis A    Prevnar-PCV-13    Pneumovac-PPSV-23    TdaP 04/30/21   HPV  02/22/24, 8/3, 12/2   Shingrix ( zoster vaccine)     ______________________  Labs Lab Result  Date comment  HIV VL Less than 20 05/2023   CD4 05/2023    Genotype No resistance Geno sure prime on 12/17/2020   [id number]     HIV antibody     RPR Nonreactive 05/2023   Quantiferon Gold Nonreactive 05/2023    Hep C ab Nonreactive 12/17/2020 12/17/2020  Hepatitis B-ab,ag,c 16.5 hepatitis B post vaccine antibodies    Hepatitis A-IgM, IgG /T Nonreactive    Lipid     GC/CHL     PAP     HB,PLT,Cr, LFT 13, 226, 1.11      Preventive  Procedure Result  Date comment  colonoscopy     Mammogram     Dental exam     Opthal       Impression/Recommendation ? HIV disease .   On  dovato  since  Feb 2023 Undetectable Vl and cd4 1200 Labs today  CKD - Now the crcl > 60 and cr is 1.29)   Hyperlipidemia on atorvastatin   Restarted depo provera  by Ob HPV seen but no cytological changes Discussed HPV vaccine- shared decision making-started vaccine series-June 2025, today is her 3rd dose   Pt is only on risperdal , topomax buspar , depakote - for behavioral disorder, depression Has intellectual disability Discussed the management with the patient and the caregiver.  Labs Follow up 6 months

## 2024-08-23 LAB — T-HELPER CELLS CD4/CD8 %
% CD 4 Pos. Lymph.: 52.4 % (ref 30.8–58.5)
Absolute CD 4 Helper: 1205 /uL (ref 359–1519)
Basophils Absolute: 0 x10E3/uL (ref 0.0–0.2)
Basos: 1 %
CD3+CD4+ Cells/CD3+CD8+ Cells Bld: 1.96 (ref 0.92–3.72)
CD3+CD8+ Cells # Bld: 614 /uL (ref 109–897)
CD3+CD8+ Cells NFr Bld: 26.7 % (ref 12.0–35.5)
EOS (ABSOLUTE): 0 x10E3/uL (ref 0.0–0.4)
Eos: 1 %
Hematocrit: 38 % (ref 34.0–46.6)
Hemoglobin: 12.5 g/dL (ref 11.1–15.9)
Immature Grans (Abs): 0.1 x10E3/uL (ref 0.0–0.1)
Immature Granulocytes: 1 %
Lymphocytes Absolute: 2.3 x10E3/uL (ref 0.7–3.1)
Lymphs: 27 %
MCH: 31.2 pg (ref 26.6–33.0)
MCHC: 32.9 g/dL (ref 31.5–35.7)
MCV: 95 fL (ref 79–97)
Monocytes Absolute: 0.7 x10E3/uL (ref 0.1–0.9)
Monocytes: 9 %
Neutrophils Absolute: 5.3 x10E3/uL (ref 1.4–7.0)
Neutrophils: 61 %
Platelets: 270 x10E3/uL (ref 150–450)
RBC: 4.01 x10E6/uL (ref 3.77–5.28)
RDW: 12.3 % (ref 11.7–15.4)
WBC: 8.4 x10E3/uL (ref 3.4–10.8)

## 2024-08-23 LAB — HIV-1 RNA QUANT-NO REFLEX-BLD
HIV 1 RNA Quant: <20 {copies}/mL
LOG10 HIV-1 RNA: UNDETERMINED {Log_copies}/mL

## 2024-08-23 LAB — SYPHILIS: RPR W/REFLEX TO RPR TITER AND TREPONEMAL ANTIBODIES, TRADITIONAL SCREENING AND DIAGNOSIS ALGORITHM: RPR Ser Ql: NONREACTIVE

## 2024-08-24 ENCOUNTER — Ambulatory Visit: Payer: MEDICAID | Admitting: Infectious Diseases

## 2024-09-05 ENCOUNTER — Ambulatory Visit: Payer: MEDICAID

## 2024-09-05 VITALS — BP 100/70 | HR 80 | Wt 193.2 lb

## 2024-09-05 DIAGNOSIS — Z3042 Encounter for surveillance of injectable contraceptive: Secondary | ICD-10-CM

## 2024-09-05 MED ORDER — MEDROXYPROGESTERONE ACETATE 150 MG/ML IM SUSY
150.0000 mg | PREFILLED_SYRINGE | Freq: Once | INTRAMUSCULAR | Status: AC
  Administered 2024-09-05: 13:00:00 150 mg via INTRAMUSCULAR

## 2024-09-05 NOTE — Progress Notes (Signed)
° ° °  NURSE VISIT NOTE  Subjective:    Patient ID: Crystal [name], female    DOB: September 30, 1990, 33 y.o.   MRN: [id number]  HPI  Patient is a 33 y.o. G0P0000 female who presents for depo provera  injection.   Objective:    BP 100/70   Pulse 80   Wt 193 lb 3.2 oz (87.6 kg)   BMI 32.38 kg/m   Last Annual: 01/04/24. Last pap: 01/04/24. Last Depo-Provera : 06/13/24. Side Effects if any: none. Serum HCG indicated? No . Depo-Provera  150 mg IM given by: Burnard Ro, CMA. Site: Right Deltoid    Assessment:   1. Encounter for management and injection of depo-Provera       Plan:   Next appointment due between March 3rd and March 17 .    Burnard LITTIE Ro, CMA

## 2024-09-07 ENCOUNTER — Ambulatory Visit: Payer: Self-pay

## 2024-09-25 ENCOUNTER — Other Ambulatory Visit: Payer: Self-pay | Admitting: Physician Assistant

## 2024-09-29 ENCOUNTER — Encounter: Payer: Self-pay | Admitting: Nurse Practitioner

## 2024-09-29 ENCOUNTER — Ambulatory Visit (INDEPENDENT_AMBULATORY_CARE_PROVIDER_SITE_OTHER): Payer: MEDICAID | Admitting: Nurse Practitioner

## 2024-09-29 VITALS — BP 94/70 | HR 78 | Temp 97.7°F | Resp 16 | Ht 64.76 in | Wt 191.2 lb

## 2024-09-29 DIAGNOSIS — R051 Acute cough: Secondary | ICD-10-CM

## 2024-09-29 DIAGNOSIS — J3089 Other allergic rhinitis: Secondary | ICD-10-CM

## 2024-09-29 MED ORDER — AZELASTINE HCL 0.1 % NA SOLN: 1.0000 | Freq: Two times a day (BID) | NASAL | 12 refills | Status: AC

## 2024-09-29 MED ORDER — LEVOCETIRIZINE DIHYDROCHLORIDE 5 MG PO TABS: 5.0000 mg | ORAL_TABLET | Freq: Every evening | ORAL | 1 refills | Status: AC

## 2024-09-29 MED ORDER — BENZONATATE 200 MG PO CAPS: 200.0000 mg | ORAL_CAPSULE | Freq: Two times a day (BID) | ORAL | 1 refills | Status: AC | PRN

## 2024-09-29 NOTE — Progress Notes (Signed)
 [facility] [address] [place], [place] [address]  Internal MEDICINE  Office Visit Note  Patient Name: Crystal [name]  [id number]  [id number]  Date of Service: 09/29/2024  Chief Complaint  Patient presents with   Acute Visit    Cough, drainage, nose stuffy     HPI Crystal [name] presents for an acute sick visit for respiratory symptoms.  --onset of symptoms was about 5 days ago --COVID test? --she reports nasal congestion, sneezing, coughing    Current Medication:  Outpatient Encounter Medications as of 09/29/2024  Medication Sig   azelastine  (ASTELIN ) 0.1 % nasal spray Place 1 spray into both nostrils 2 (two) times daily.   benzonatate  (TESSALON ) 200 MG capsule Take 1 capsule (200 mg total) by mouth 2 (two) times daily as needed for cough.   levocetirizine (XYZAL ) 5 MG tablet Take 1 tablet (5 mg total) by mouth every evening.   acetaminophen  (TYLENOL ) 500 MG tablet Take 1 tablet (500 mg total) by mouth every 6 (six) hours as needed.   busPIRone  (BUSPAR ) 30 MG tablet Take 30 mg by mouth 2 (two) times daily.   Cholecalciferol (VITAMIN D3) 125 MCG (5000 UT) TABS Take by mouth.   divalproex  (DEPAKOTE ) 250 MG DR tablet Take 1 tablet (250 mg total) by mouth 2 (two) times daily.   dolutegravir -lamiVUDine  (DOVATO ) 50-300 MG tablet Take 1 tablet by mouth daily.   gabapentin  (NEURONTIN ) 100 MG capsule Take 1 capsule (100 mg total) by mouth 3 (three) times daily.   hydrOXYzine  (ATARAX ) 25 MG tablet Take 1 tablet (25 mg total) by mouth 3 (three) times daily as needed.   hydrOXYzine  (VISTARIL ) 50 MG capsule Take 50 mg by mouth 2 (two) times daily as needed.   lamoTRIgine (LAMICTAL) 150 MG tablet Take 150 mg by mouth 2 (two) times daily.   LORazepam  (ATIVAN ) 1 MG tablet Take 1 mg by mouth as needed for anxiety.   melatonin 5 MG TABS Take 5 mg by mouth at bedtime.   senna (SENOKOT) 8.6 MG TABS tablet Take 1 tablet (8.6 mg total) by mouth daily as needed for mild constipation.    STOOL SOFTENER 100 MG capsule Take 100 mg by mouth daily as needed for mild constipation.   topiramate  (TOPAMAX ) 25 MG tablet Take 25 mg by mouth 2 (two) times daily.   UZEDY  100 MG/0.28ML syringe    [DISCONTINUED] ALLERGY RELIEF 10 MG tablet TAKE 1 TABLET BY MOUTH ONCE DAILY   No facility-administered encounter medications on file as of 09/29/2024.      Medical History: Past Medical History:  Diagnosis Date   Cancer (HCC)    skin   Depression    HIV (human immunodeficiency virus infection) (HCC)      Vital Signs: BP 94/70   Pulse 78   Temp 97.7 F (36.5 C)   Resp 16   Ht 5' 4.76 (1.645 m)   Wt 191 lb 3.2 oz (86.7 kg)   SpO2 99%   BMI 32.05 kg/m    Review of Systems  Constitutional:  Negative for chills, fatigue and fever.  HENT:  Positive for congestion, postnasal drip, rhinorrhea and sneezing. Negative for ear pain, sinus pressure, sinus pain and sore throat.   Respiratory:  Positive for cough. Negative for chest tightness, shortness of breath and wheezing.   Cardiovascular: Negative.  Negative for chest pain and palpitations.  Musculoskeletal: Negative.  Negative for myalgias.  Neurological:  Negative for headaches.    Physical Exam Vitals reviewed.  Constitutional:  General: She is not in acute distress.    Appearance: Normal appearance. She is obese. She is ill-appearing.  HENT:     Head: Normocephalic and atraumatic.     Right Ear: Tympanic membrane, ear canal and external ear normal.     Left Ear: Tympanic membrane, ear canal and external ear normal.     Nose: Nasal deformity, mucosal edema, congestion and rhinorrhea present.     Right Nostril: Occlusion present.     Right Turbinates: Enlarged and pale.     Left Turbinates: Enlarged and pale.     Right Sinus: No maxillary sinus tenderness or frontal sinus tenderness.     Left Sinus: No maxillary sinus tenderness or frontal sinus tenderness.     Mouth/Throat:     Mouth: Mucous membranes are moist.      Pharynx: Posterior oropharyngeal erythema present.  Eyes:     Extraocular Movements: Extraocular movements intact.     Conjunctiva/sclera: Conjunctivae normal.     Pupils: Pupils are equal, round, and reactive to light.  Cardiovascular:     Rate and Rhythm: Normal rate and regular rhythm.     Heart sounds: Normal heart sounds. No murmur heard. Pulmonary:     Effort: Pulmonary effort is normal. No respiratory distress.     Breath sounds: Normal breath sounds. No wheezing.  Skin:    Capillary Refill: Capillary refill takes less than 2 seconds.  Neurological:     Mental Status: She is alert and oriented to person, place, and time.  Psychiatric:        Mood and Affect: Mood normal.        Behavior: Behavior normal.       Assessment/Plan: 1. Non-seasonal allergic rhinitis, unspecified trigger (Primary) Discontinue loratadine  and start levocetirizine tonight. Caregiver to teach patient how to use nasal spray. She may need to take oral medication for a few days before starting the nasal spray due to copious thick nasal drainage.  - levocetirizine (XYZAL ) 5 MG tablet; Take 1 tablet (5 mg total) by mouth every evening.  Dispense: 90 tablet; Refill: 1 - azelastine  (ASTELIN ) 0.1 % nasal spray; Place 1 spray into both nostrils 2 (two) times daily.  Dispense: 30 mL; Refill: 12  2. Acute cough Cough medication prescribed as needed.  - benzonatate  (TESSALON ) 200 MG capsule; Take 1 capsule (200 mg total) by mouth 2 (two) times daily as needed for cough.  Dispense: 30 capsule; Refill: 1   General Counseling: Crystal [name] verbalizes understanding of the findings of todays visit and agrees with plan of treatment. I have discussed any further diagnostic evaluation that may be needed or ordered today. We also reviewed her medications today. she has been encouraged to call the office with any questions or concerns that should arise related to todays visit.    Counseling:    No orders of the defined  types were placed in this encounter.   Meds ordered this encounter  Medications   levocetirizine (XYZAL ) 5 MG tablet    Sig: Take 1 tablet (5 mg total) by mouth every evening.    Dispense:  90 tablet    Refill:  1    Discontinue loratadine  and fill new script today.   azelastine  (ASTELIN ) 0.1 % nasal spray    Sig: Place 1 spray into both nostrils 2 (two) times daily.    Dispense:  30 mL    Refill:  12    Fill new script today   benzonatate  (TESSALON ) 200 MG capsule  Sig: Take 1 capsule (200 mg total) by mouth 2 (two) times daily as needed for cough.    Dispense:  30 capsule    Refill:  1    Return if symptoms worsen or fail to improve.  [place] Controlled Substance Database was reviewed by me for overdose risk score (ORS)  Time spent:30 Minutes Time spent with patient included reviewing progress notes, labs, imaging studies, and discussing plan for follow up.   This patient was seen by Mardy Maxin, FNP-C in collaboration with Dr. Sigrid Bathe as a part of collaborative care agreement.  Nettie Cromwell R. Maxin, MSN, FNP-C Internal Medicine

## 2024-10-14 ENCOUNTER — Other Ambulatory Visit: Payer: Self-pay

## 2024-10-14 ENCOUNTER — Emergency Department
Admission: EM | Admit: 2024-10-14 | Discharge: 2024-10-14 | Disposition: A | Discharge status: Home / Self Care | Payer: MEDICAID | Attending: Emergency Medicine | Admitting: Emergency Medicine

## 2024-10-14 DIAGNOSIS — Z85828 Personal history of other malignant neoplasm of skin: Secondary | ICD-10-CM | POA: Diagnosis not present

## 2024-10-14 DIAGNOSIS — F840 Autistic disorder: Secondary | ICD-10-CM | POA: Diagnosis not present

## 2024-10-14 DIAGNOSIS — R45851 Suicidal ideations: Secondary | ICD-10-CM | POA: Insufficient documentation

## 2024-10-14 DIAGNOSIS — F79 Unspecified intellectual disabilities: Secondary | ICD-10-CM

## 2024-10-14 DIAGNOSIS — R45850 Homicidal ideations: Secondary | ICD-10-CM | POA: Insufficient documentation

## 2024-10-14 DIAGNOSIS — Z21 Asymptomatic human immunodeficiency virus [HIV] infection status: Secondary | ICD-10-CM | POA: Insufficient documentation

## 2024-10-14 LAB — COMPREHENSIVE METABOLIC PANEL WITH GFR
ALT: 12 U/L (ref 0–44)
AST: 12 U/L — ABNORMAL LOW (ref 15–41)
Albumin: 4.6 g/dL (ref 3.5–5.0)
Alkaline Phosphatase: 67 U/L (ref 38–126)
Anion gap: 13 (ref 5–15)
BUN: 12 mg/dL (ref 6–20)
CO2: 18 mmol/L — ABNORMAL LOW (ref 22–32)
Calcium: 9.7 mg/dL (ref 8.9–10.3)
Chloride: 110 mmol/L (ref 98–111)
Creatinine, Ser: 1.17 mg/dL — ABNORMAL HIGH (ref 0.44–1.00)
GFR, Estimated: >60 mL/min
Glucose, Bld: 112 mg/dL — ABNORMAL HIGH (ref 70–99)
Potassium: 3.4 mmol/L — ABNORMAL LOW (ref 3.5–5.1)
Sodium: 141 mmol/L (ref 135–145)
Total Bilirubin: 0.3 mg/dL (ref 0.0–1.2)
Total Protein: 7.3 g/dL (ref 6.5–8.1)

## 2024-10-14 LAB — CBC
HCT: 38.7 % (ref 36.0–46.0)
Hemoglobin: 12.6 g/dL (ref 12.0–15.0)
MCH: 31 pg (ref 26.0–34.0)
MCHC: 32.6 g/dL (ref 30.0–36.0)
MCV: 95.1 fL (ref 80.0–100.0)
Platelets: 253 10*3/uL (ref 150–400)
RBC: 4.07 MIL/uL (ref 3.87–5.11)
RDW: 12.9 % (ref 11.5–15.5)
WBC: 9.2 10*3/uL (ref 4.0–10.5)
nRBC: 0 % (ref 0.0–0.2)

## 2024-10-14 LAB — URINE DRUG SCREEN
Amphetamines: NEGATIVE
Barbiturates: NEGATIVE
Benzodiazepines: NEGATIVE
Cocaine: NEGATIVE
Fentanyl: NEGATIVE
Methadone Scn, Ur: NEGATIVE
Opiates: NEGATIVE
Tetrahydrocannabinol: NEGATIVE

## 2024-10-14 LAB — PREGNANCY, URINE: Preg Test, Ur: NEGATIVE

## 2024-10-14 LAB — ETHANOL: Alcohol, Ethyl (B): <15 mg/dL

## 2024-10-14 MED ORDER — GABAPENTIN 100 MG PO CAPS
100.0000 mg | ORAL_CAPSULE | Freq: Every day | ORAL | Status: DC
  Administered 2024-10-14: 100 mg via ORAL
  Filled 2024-10-14: qty 1

## 2024-10-14 MED ORDER — LAMOTRIGINE 25 MG PO TABS
150.0000 mg | ORAL_TABLET | Freq: Two times a day (BID) | ORAL | Status: DC
  Administered 2024-10-14: 150 mg via ORAL
  Filled 2024-10-14: qty 2

## 2024-10-14 MED ORDER — LORATADINE 10 MG PO TABS: 10.0000 mg | ORAL_TABLET | Freq: Every evening | ORAL | Status: DC

## 2024-10-14 MED ORDER — TOPIRAMATE 25 MG PO TABS
25.0000 mg | ORAL_TABLET | Freq: Two times a day (BID) | ORAL | Status: DC
  Administered 2024-10-14: 25 mg via ORAL
  Filled 2024-10-14: qty 1

## 2024-10-14 MED ORDER — DOLUTEGRAVIR-LAMIVUDINE 50-300 MG PO TABS
1.0000 | ORAL_TABLET | Freq: Every day | ORAL | Status: DC
  Administered 2024-10-14: 1 via ORAL
  Filled 2024-10-14 (×2): qty 1

## 2024-10-14 MED ORDER — LORAZEPAM 1 MG PO TABS
1.0000 mg | ORAL_TABLET | ORAL | Status: DC | PRN
  Administered 2024-10-14: 1 mg via ORAL
  Filled 2024-10-14: qty 1

## 2024-10-14 NOTE — Consult Note (Signed)
 [facility] Psychiatric Consult Follow-up  Patient Name: .Crystal [name]  MRN: [id number]  DOB: 04/04/1991  Consult Order details:  Orders (From admission, onward)     Start     Ordered   10/14/24 0400  CONSULT TO CALL ACT TEAM       Ordering Provider: Neomi Josette SAILOR, DO  Provider:  (Not yet assigned)  Question:  Reason for Consult?  Answer:  Psych consult   10/14/24 0359   10/14/24 0400  IP CONSULT TO PSYCHIATRY       Ordering Provider: Neomi Josette SAILOR, DO  Provider:  (Not yet assigned)  Question:  Reason for consult:  Answer:  Medication management   10/14/24 0359             Mode of Visit: In person    Psychiatry Consult Evaluation  Service Date: October 14, 2024 LOS:  LOS: 0 days  Chief Complaint suicidal thoughts  Primary Psychiatric Diagnoses  Autism IDD   Assessment  CLINICAL DECISION MAKING:  Crystal [name] is a 34 y.o. female admitted: Presented to the EDfor 10/14/2024  3:45 AM for sucidal ideation. She carries the psychiatric diagnoses of ASD and IDD and has no past medical history. She is reassessed to determine whether she is able to return back to the group home. Patient endorses passive suicidal thoughts without plan or intent. Review of chart notes, this is her baseline. She is agreeable to return to the group home and reports that she feels safe there. Group home states they can provide a safe environment for her at this time and agreeable to provide transportation for her back to the group home. Patient denies homicidal thoughts. She has been cooperative since being in the ER.     Diagnoses:  Active Hospital problems: Active Problems:   * No active hospital problems. *    Plan   ## Disposition: Discharge to the care of the group home  ## Psychiatric Medication Recommendations:  Continue current medications  ## Medical Decision Making Capacity: Patient has a guardian and has thus been adjudicated incompetent; please involve patients guardian  in medical decision making  ## Further Work-up:  EKG- QTC: Labs:  ## Behavioral / Environmental: -Utilize compassion and acknowledge the patient's experiences while setting clear and realistic expectations for care.    ## Safety and Observation Level:  - Based on my clinical evaluation, I estimate the patient to be at LOW risk of self harm in the current setting. - At this time, we recommend  routine. This decision is based on my review of the chart including patient's history and current presentation, interview of the patient, mental status examination, and consideration of suicide risk including evaluating suicidal ideation, plan, intent, suicidal or self-harm behaviors, risk factors, and protective factors. This judgment is based on our ability to directly address suicide risk, implement suicide prevention strategies, and develop a safety plan while the patient is in the clinical setting. Please contact our team if there is a concern that risk level has changed.  CSSR Risk Category:C-SSRS RISK CATEGORY:  Flowsheet Row ED from 10/14/2024 in [facility] Emergency Department at [facility] ED from 04/17/2024 in [facility] Emergency Department at [facility] ED from 12/27/2023 in [facility] Emergency Department at [facility]  C-SSRS RISK CATEGORY High Risk High Risk High Risk     Suicide Risk Assessment: Patient has following modifiable risk factors for suicide: recent psychiatric hospitalization, which we are addressing by ensuring safe environment and therapeutic communication.  Patient has following non-modifiable or demographic risk factors for suicide: history of suicide attempt and history of self harm behavior  Patient has the following protective factors against suicide: Access to outpatient mental health care, Supportive family, and Supportive friends  Thank you for this consult request. Recommendations have been communicated to the primary team.  We will recommend  discharge to the group home at this time.       History of Present Illness  Relevant Aspects of Hospital ED   Patient Report:     Psychiatric and Social History  Psychiatric History:  Information collected from patient/chart  Prev Dx/Sx:  Current Psych Provider:  Home Meds (current):  Previous Med Trials:  Therapy:  Prior Psych Hospitalization:  Prior Suicide attempt/Self Harm:  Prior Violence:   Family Psych History:  Family Hx suicide:   Social History:  Educational Hx:  Occupational Hx:  Legal Hx:  Living Situation:  Spiritual Hx:  Access to weapons/lethal means: denies   Substance History Alcohol:  Last Drink : Number of drinks per day : History of alcohol withdrawal seizures: History of DT's: Tobacco:  Illicit drugs:  Prescription drug abuse:  Rehab hx:   Exam Findings  Physical Exam: Reviewed and agree with the physical exam findings conducted by the medical provider Vital Signs:  Temp:  [97.6 F (36.4 C)-98.7 F (37.1 C)] 98.7 F (37.1 C) (01/24 1411) Pulse Rate:  [84-107] 85 (01/24 1411) Resp:  [16-18] 17 (01/24 1411) BP: (114-116)/(77-84) 114/77 (01/24 1411) SpO2:  [96 %-97 %] 97 % (01/24 1411) Weight:  [88.5 kg] 88.5 kg (01/24 0328) Blood pressure 114/77, pulse 85, temperature 98.7 F (37.1 C), temperature source Oral, resp. rate 17, height 5' 5 (1.651 m), weight 88.5 kg, SpO2 97%. Body mass index is 32.45 kg/m.    Mental Status Exam: General Appearance: Neat  Orientation:  Full (Time, Place, and Person)  Memory:  Immediate;   Fair Recent;   Fair Remote;   Fair  Concentration:  Concentration: Fair and Attention Span: Fair  Recall:  Good  Attention  Fair  Eye Contact:  Good  Speech:  Clear and Coherent  Language:  Good  Volume:  Normal  Mood: dysphoric  Affect:  Appropriate  Thought Process:  Coherent  Thought Content:  WDL  Suicidal Thoughts:  No  Homicidal Thoughts:  No  Judgement:  Fair  Insight:  Shallow  Psychomotor  Activity:  Normal  Akathisia:  Negative  Fund of Knowledge:  Fair      Assets:  Desire for Improvement Housing Social Support  Cognition:  WNL  ADL's:  Intact  AIMS (if indicated):        Other History   These have been pulled in through the EMR, reviewed, and updated if appropriate.  Family History:  The patient's family history is not on file.  Medical History: Past Medical History:  Diagnosis Date   Cancer (HCC)    skin   Depression    HIV (human immunodeficiency virus infection) (HCC)     Surgical History: Past Surgical History:  Procedure Laterality Date   SKIN SURGERY       Medications:  Current Medications[1]  Allergies: Allergies[2]  Rudolf Blizard B Mariely Mahr, NP     [1]  Current Facility-Administered Medications:    dolutegravir -lamiVUDine  (DOVATO ) 50-300 MG per tablet 1 tablet, 1 tablet, Oral, Daily, Ward, Kristen N, DO, 1 tablet at 10/14/24 9043   gabapentin  (NEURONTIN ) capsule 100 mg, 100 mg, Oral, Daily, McLauchlin, Angela, NP, 100 mg at  10/14/24 0956   lamoTRIgine  (LAMICTAL ) tablet 150 mg, 150 mg, Oral, BID, McLauchlin, Angela, NP, 150 mg at 10/14/24 9044   loratadine  (CLARITIN ) tablet 10 mg, 10 mg, Oral, QPM, Ward, Kristen N, DO   LORazepam  (ATIVAN ) tablet 1 mg, 1 mg, Oral, PRN, McLauchlin, Angela, NP, 1 mg at 10/14/24 1316   topiramate  (TOPAMAX ) tablet 25 mg, 25 mg, Oral, BID, McLauchlin, Angela, NP, 25 mg at 10/14/24 9044  Current Outpatient Medications:    Acetaminophen  Extra Strength 500 MG TABS, TAKE 1 TABLET BY MOUTH EVERY 6 HOURS AS NEEDED *DO NOT EXCEED 4GM OF TYLENOL  IN 24 HOURS*, Disp: 30 tablet, Rfl: 1   azelastine  (ASTELIN ) 0.1 % nasal spray, Place 1 spray into both nostrils 2 (two) times daily., Disp: 30 mL, Rfl: 12   benzonatate  (TESSALON ) 200 MG capsule, Take 1 capsule (200 mg total) by mouth 2 (two) times daily as needed for cough., Disp: 30 capsule, Rfl: 1   busPIRone  (BUSPAR ) 30 MG tablet, Take 30 mg by mouth 2 (two) times daily., Disp:  , Rfl:    dolutegravir -lamiVUDine  (DOVATO ) 50-300 MG tablet, Take 1 tablet by mouth daily., Disp: 30 tablet, Rfl: 11   gabapentin  (NEURONTIN ) 100 MG capsule, Take 1 capsule (100 mg total) by mouth 3 (three) times daily., Disp: 180 capsule, Rfl: 0   hydrOXYzine  (VISTARIL ) 25 MG capsule, Take 25 mg by mouth daily as needed., Disp: , Rfl:    hydrOXYzine  (VISTARIL ) 50 MG capsule, Take 50 mg by mouth 2 (two) times daily as needed., Disp: , Rfl:    lamoTRIgine  (LAMICTAL ) 150 MG tablet, Take 150 mg by mouth 2 (two) times daily., Disp: , Rfl:    levocetirizine (XYZAL ) 5 MG tablet, Take 1 tablet (5 mg total) by mouth every evening., Disp: 90 tablet, Rfl: 1   loratadine  (CLARITIN  REDITABS) 10 MG dissolvable tablet, Take 10 mg by mouth daily., Disp: , Rfl:    senna (SENOKOT) 8.6 MG TABS tablet, Take 1 tablet (8.6 mg total) by mouth daily as needed for mild constipation., Disp: 30 tablet, Rfl: 0   topiramate  (TOPAMAX ) 25 MG tablet, Take 25 mg by mouth 2 (two) times daily., Disp: , Rfl:    Cholecalciferol (VITAMIN D3) 125 MCG (5000 UT) TABS, Take by mouth., Disp: , Rfl:    divalproex  (DEPAKOTE ) 250 MG DR tablet, Take 1 tablet (250 mg total) by mouth 2 (two) times daily., Disp: 120 tablet, Rfl: 0   hydrOXYzine  (ATARAX ) 25 MG tablet, Take 1 tablet (25 mg total) by mouth 3 (three) times daily as needed. (Patient not taking: Reported on 10/14/2024), Disp: 30 tablet, Rfl: 0   LORazepam  (ATIVAN ) 1 MG tablet, Take 1 mg by mouth as needed for anxiety. (Patient not taking: Reported on 10/14/2024), Disp: , Rfl:    melatonin 5 MG TABS, Take 5 mg by mouth at bedtime., Disp: , Rfl:    STOOL SOFTENER 100 MG capsule, Take 100 mg by mouth daily as needed for mild constipation., Disp: , Rfl:    UZEDY  100 MG/0.28ML syringe, , Disp: , Rfl:  [2] No Known Allergies

## 2024-10-14 NOTE — ED Notes (Signed)
 DSS at bedside to talk to pt

## 2024-10-14 NOTE — Discharge Instructions (Signed)
You have been seen in the emergency department for a  psychiatric concern. You have been evaluated both medically as well as psychiatrically. Please follow-up with your outpatient resources provided. Return to the emergency department for any worsening symptoms, or any thoughts of hurting yourself or anyone else so that we may attempt to help you. 

## 2024-10-14 NOTE — ED Notes (Signed)
 Hospital meal provided, pt tolerated w/o complaints.  Waste discarded appropriately.
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
## 2024-10-14 NOTE — ED Notes (Addendum)
 SABRA

## 2024-10-14 NOTE — ED Provider Notes (Signed)
----------------------------------------- °  2:49 PM on 10/14/2024 ----------------------------------------- Patient has been seen evaluate by psychiatry.  They believe the patient safe for discharge home from psychiatric standpoint, back to the patient's group facility.  TTS is spoken to the group facility they are okay with the patient returning.  Will discharge from the emergency department outpatient follow-up.   Dorothyann Drivers, MD 10/14/24 [phone]
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
## 2024-10-14 NOTE — BH Assessment (Signed)
 TTS briefly spoke with the Group Home ([phone]), she had already spoke with the patient's nurse and working on the patient returning to the group home.
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
## 2024-10-14 NOTE — ED Notes (Signed)
 All belongings returned to patient, d/c to [facility] worker

## 2024-10-14 NOTE — ED Notes (Signed)
 This RN spoke to Crystal [name] at group home and discussed pts wishes to return back to [facility]. Crystal [name] [phone]
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
## 2024-10-14 NOTE — ED Notes (Signed)
 This RN spoke to Crystal [name] at [facility] to confirm transport back to [facility]

## 2024-10-24 ENCOUNTER — Other Ambulatory Visit: Payer: Self-pay | Admitting: Physician Assistant

## 2024-11-21 ENCOUNTER — Ambulatory Visit: Payer: MEDICAID

## 2025-01-25 ENCOUNTER — Ambulatory Visit: Payer: MEDICAID | Admitting: Physician Assistant

## 2025-02-20 ENCOUNTER — Ambulatory Visit: Payer: MEDICAID | Admitting: Infectious Diseases

## 2025-07-30 ENCOUNTER — Encounter: Payer: MEDICAID | Admitting: Physician Assistant
